# Patient Record
Sex: Female | Born: 1935 | Race: Black or African American | Hispanic: No | Smoking: Never smoker
Health system: Southern US, Community
[De-identification: ages and names within clinical notes are randomized; demographics above are authoritative.]

## PROBLEM LIST (undated history)

## (undated) DIAGNOSIS — E119 Type 2 diabetes mellitus without complications: Secondary | ICD-10-CM

## (undated) DIAGNOSIS — I739 Peripheral vascular disease, unspecified: Secondary | ICD-10-CM

## (undated) DIAGNOSIS — I5020 Unspecified systolic (congestive) heart failure: Secondary | ICD-10-CM

## (undated) DIAGNOSIS — I509 Heart failure, unspecified: Secondary | ICD-10-CM

## (undated) DIAGNOSIS — I10 Essential (primary) hypertension: Secondary | ICD-10-CM

## (undated) DIAGNOSIS — N189 Chronic kidney disease, unspecified: Secondary | ICD-10-CM

## (undated) DIAGNOSIS — J449 Chronic obstructive pulmonary disease, unspecified: Secondary | ICD-10-CM

## (undated) HISTORY — DX: Chronic obstructive pulmonary disease, unspecified: J44.9

## (undated) HISTORY — DX: Peripheral vascular disease, unspecified: I73.9

---

## 1999-12-09 ENCOUNTER — Encounter: Payer: Self-pay | Admitting: Emergency Medicine

## 1999-12-10 ENCOUNTER — Inpatient Hospital Stay (HOSPITAL_COMMUNITY): Admission: EM | Admit: 1999-12-10 | Discharge: 1999-12-13 | Payer: Self-pay | Admitting: Emergency Medicine

## 2000-02-18 ENCOUNTER — Encounter: Admission: RE | Admit: 2000-02-18 | Discharge: 2000-02-18 | Payer: Self-pay | Admitting: Family Medicine

## 2000-02-18 ENCOUNTER — Encounter: Payer: Self-pay | Admitting: Family Medicine

## 2000-02-25 ENCOUNTER — Encounter: Admission: RE | Admit: 2000-02-25 | Discharge: 2000-02-25 | Payer: Self-pay | Admitting: Family Medicine

## 2000-02-25 ENCOUNTER — Encounter: Payer: Self-pay | Admitting: Family Medicine

## 2001-07-20 ENCOUNTER — Encounter: Payer: Self-pay | Admitting: Family Medicine

## 2001-07-20 ENCOUNTER — Encounter: Admission: RE | Admit: 2001-07-20 | Discharge: 2001-07-20 | Payer: Self-pay | Admitting: Family Medicine

## 2005-12-10 ENCOUNTER — Encounter: Admission: RE | Admit: 2005-12-10 | Discharge: 2005-12-10 | Payer: Self-pay | Admitting: Family Medicine

## 2006-08-18 ENCOUNTER — Inpatient Hospital Stay (HOSPITAL_COMMUNITY): Admission: EM | Admit: 2006-08-18 | Discharge: 2006-08-22 | Payer: Self-pay | Admitting: *Deleted

## 2006-08-18 ENCOUNTER — Encounter (INDEPENDENT_AMBULATORY_CARE_PROVIDER_SITE_OTHER): Payer: Self-pay | Admitting: Cardiology

## 2007-05-11 ENCOUNTER — Encounter: Admission: RE | Admit: 2007-05-11 | Discharge: 2007-05-11 | Payer: Self-pay | Admitting: Podiatry

## 2008-11-09 ENCOUNTER — Ambulatory Visit: Payer: Self-pay | Admitting: Cardiology

## 2008-11-10 ENCOUNTER — Inpatient Hospital Stay (HOSPITAL_COMMUNITY): Admission: EM | Admit: 2008-11-10 | Discharge: 2008-11-18 | Payer: Self-pay | Admitting: Emergency Medicine

## 2008-11-13 ENCOUNTER — Encounter (INDEPENDENT_AMBULATORY_CARE_PROVIDER_SITE_OTHER): Payer: Self-pay | Admitting: Internal Medicine

## 2008-11-16 ENCOUNTER — Encounter (INDEPENDENT_AMBULATORY_CARE_PROVIDER_SITE_OTHER): Payer: Self-pay | Admitting: Internal Medicine

## 2010-06-25 ENCOUNTER — Ambulatory Visit: Payer: Self-pay | Admitting: Vascular Surgery

## 2010-06-28 ENCOUNTER — Ambulatory Visit (HOSPITAL_COMMUNITY): Admission: RE | Admit: 2010-06-28 | Discharge: 2010-06-28 | Payer: Self-pay | Admitting: Vascular Surgery

## 2010-06-28 ENCOUNTER — Ambulatory Visit: Payer: Self-pay | Admitting: Vascular Surgery

## 2011-01-09 LAB — POCT I-STAT, CHEM 8
BUN: 34 mg/dL — ABNORMAL HIGH (ref 6–23)
Calcium, Ion: 1.09 mmol/L — ABNORMAL LOW (ref 1.12–1.32)
Creatinine, Ser: 1.9 mg/dL — ABNORMAL HIGH (ref 0.4–1.2)
Glucose, Bld: 101 mg/dL — ABNORMAL HIGH (ref 70–99)
TCO2: 21 mmol/L (ref 0–100)

## 2011-01-09 LAB — BASIC METABOLIC PANEL
BUN: 33 mg/dL — ABNORMAL HIGH (ref 6–23)
CO2: 22 mEq/L (ref 19–32)
Calcium: 9 mg/dL (ref 8.4–10.5)
Chloride: 107 mEq/L (ref 96–112)
Creatinine, Ser: 1.73 mg/dL — ABNORMAL HIGH (ref 0.4–1.2)
Glucose, Bld: 91 mg/dL (ref 70–99)

## 2011-02-10 LAB — COMPREHENSIVE METABOLIC PANEL
ALT: <8 U/L (ref 0–35)
AST: 18 U/L (ref 0–37)
CO2: 26 mEq/L (ref 19–32)
Chloride: 101 mEq/L (ref 96–112)
Creatinine, Ser: 1.92 mg/dL — ABNORMAL HIGH (ref 0.4–1.2)
GFR calc Af Amer: 30 mL/min — ABNORMAL LOW (ref >60)
GFR calc non Af Amer: 25 mL/min — ABNORMAL LOW (ref >60)
Glucose, Bld: 67 mg/dL — ABNORMAL LOW (ref 70–99)
Total Bilirubin: 0.6 mg/dL (ref 0.3–1.2)

## 2011-02-10 LAB — POCT CARDIAC MARKERS
CKMB, poc: 2.5 ng/mL (ref 1.0–8.0)
Myoglobin, poc: 156 ng/mL (ref 12–200)
Troponin i, poc: <0.05 ng/mL (ref 0.00–0.09)

## 2011-02-10 LAB — BASIC METABOLIC PANEL
BUN: 22 mg/dL (ref 6–23)
BUN: 36 mg/dL — ABNORMAL HIGH (ref 6–23)
BUN: 89 mg/dL — ABNORMAL HIGH (ref 6–23)
CO2: 25 mEq/L (ref 19–32)
CO2: 26 mEq/L (ref 19–32)
CO2: 26 mEq/L (ref 19–32)
CO2: 27 mEq/L (ref 19–32)
Calcium: 8.5 mg/dL (ref 8.4–10.5)
Calcium: 8.7 mg/dL (ref 8.4–10.5)
Calcium: 8.8 mg/dL (ref 8.4–10.5)
Calcium: 8.8 mg/dL (ref 8.4–10.5)
Chloride: 100 mEq/L (ref 96–112)
Chloride: 101 mEq/L (ref 96–112)
Chloride: 103 mEq/L (ref 96–112)
Chloride: 104 mEq/L (ref 96–112)
Chloride: 106 mEq/L (ref 96–112)
Creatinine, Ser: 2.07 mg/dL — ABNORMAL HIGH (ref 0.4–1.2)
GFR calc Af Amer: 28 mL/min — ABNORMAL LOW (ref >60)
GFR calc Af Amer: 30 mL/min — ABNORMAL LOW (ref >60)
GFR calc non Af Amer: 15 mL/min — ABNORMAL LOW (ref >60)
GFR calc non Af Amer: 23 mL/min — ABNORMAL LOW (ref >60)
GFR calc non Af Amer: 23 mL/min — ABNORMAL LOW (ref >60)
GFR calc non Af Amer: 25 mL/min — ABNORMAL LOW (ref >60)
GFR calc non Af Amer: 27 mL/min — ABNORMAL LOW (ref >60)
GFR calc non Af Amer: 39 mL/min — ABNORMAL LOW (ref >60)
Glucose, Bld: 100 mg/dL — ABNORMAL HIGH (ref 70–99)
Glucose, Bld: 101 mg/dL — ABNORMAL HIGH (ref 70–99)
Glucose, Bld: 103 mg/dL — ABNORMAL HIGH (ref 70–99)
Glucose, Bld: 112 mg/dL — ABNORMAL HIGH (ref 70–99)
Glucose, Bld: 188 mg/dL — ABNORMAL HIGH (ref 70–99)
Glucose, Bld: 81 mg/dL (ref 70–99)
Glucose, Bld: 91 mg/dL (ref 70–99)
Potassium: 3.9 mEq/L (ref 3.5–5.1)
Potassium: 4 mEq/L (ref 3.5–5.1)
Potassium: 4.1 mEq/L (ref 3.5–5.1)
Potassium: 4.3 mEq/L (ref 3.5–5.1)
Potassium: 4.3 mEq/L (ref 3.5–5.1)
Potassium: 4.4 mEq/L (ref 3.5–5.1)
Sodium: 136 mEq/L (ref 135–145)
Sodium: 137 mEq/L (ref 135–145)
Sodium: 137 mEq/L (ref 135–145)
Sodium: 138 mEq/L (ref 135–145)
Sodium: 138 mEq/L (ref 135–145)
Sodium: 139 mEq/L (ref 135–145)

## 2011-02-10 LAB — RETICULOCYTES
RBC.: 3.46 MIL/uL — ABNORMAL LOW (ref 3.87–5.11)
Retic Count, Absolute: 38.1 10*3/uL (ref 19.0–186.0)

## 2011-02-10 LAB — URINE MICROSCOPIC-ADD ON

## 2011-02-10 LAB — CARDIAC PANEL(CRET KIN+CKTOT+MB+TROPI)
CK, MB: 3.1 ng/mL (ref 0.3–4.0)
CK, MB: 4.1 ng/mL — ABNORMAL HIGH (ref 0.3–4.0)
Relative Index: INVALID (ref 0.0–2.5)
Total CK: 58 U/L (ref 7–177)
Troponin I: 0.42 ng/mL — ABNORMAL HIGH (ref 0.00–0.06)

## 2011-02-10 LAB — DIFFERENTIAL
Basophils Absolute: 0 10*3/uL (ref 0.0–0.1)
Basophils Absolute: 0 10*3/uL (ref 0.0–0.1)
Basophils Absolute: 0.1 10*3/uL (ref 0.0–0.1)
Basophils Relative: 0 % (ref 0–1)
Eosinophils Absolute: 0.1 K/uL (ref 0.0–0.7)
Eosinophils Relative: 0 % (ref 0–5)
Eosinophils Relative: 2 % (ref 0–5)
Lymphocytes Relative: 16 % (ref 12–46)
Lymphocytes Relative: 17 % (ref 12–46)
Lymphocytes Relative: 9 % — ABNORMAL LOW (ref 12–46)
Lymphs Abs: 1.5 10*3/uL (ref 0.7–4.0)
Lymphs Abs: 1.7 10*3/uL (ref 0.7–4.0)
Lymphs Abs: 1.9 K/uL (ref 0.7–4.0)
Monocytes Absolute: 0.7 10*3/uL (ref 0.1–1.0)
Monocytes Absolute: 0.9 10*3/uL (ref 0.1–1.0)
Monocytes Absolute: 1 K/uL (ref 0.1–1.0)
Monocytes Relative: 5 % (ref 3–12)
Monocytes Relative: 9 % (ref 3–12)
Neutro Abs: 17.3 10*3/uL — ABNORMAL HIGH (ref 1.7–7.7)
Neutro Abs: 6.4 10*3/uL (ref 1.7–7.7)
Neutro Abs: 7.6 10*3/uL (ref 1.7–7.7)
Neutrophils Relative %: 85 % — ABNORMAL HIGH (ref 43–77)

## 2011-02-10 LAB — CBC
HCT: 27.1 % — ABNORMAL LOW (ref 36.0–46.0)
HCT: 27.4 % — ABNORMAL LOW (ref 36.0–46.0)
HCT: 30.1 % — ABNORMAL LOW (ref 36.0–46.0)
HCT: 32 % — ABNORMAL LOW (ref 36.0–46.0)
Hemoglobin: 10 g/dL — ABNORMAL LOW (ref 12.0–15.0)
Hemoglobin: 10.3 g/dL — ABNORMAL LOW (ref 12.0–15.0)
Hemoglobin: 9 g/dL — ABNORMAL LOW (ref 12.0–15.0)
Hemoglobin: 9.1 g/dL — ABNORMAL LOW (ref 12.0–15.0)
Hemoglobin: 9.2 g/dL — ABNORMAL LOW (ref 12.0–15.0)
Hemoglobin: 9.7 g/dL — ABNORMAL LOW (ref 12.0–15.0)
Hemoglobin: 9.8 g/dL — ABNORMAL LOW (ref 12.0–15.0)
MCHC: 32 g/dL (ref 30.0–36.0)
MCHC: 32.1 g/dL (ref 30.0–36.0)
MCHC: 32.2 g/dL (ref 30.0–36.0)
MCHC: 32.4 g/dL (ref 30.0–36.0)
MCHC: 33 g/dL (ref 30.0–36.0)
MCHC: 33.4 g/dL (ref 30.0–36.0)
MCV: 88.7 fL (ref 78.0–100.0)
MCV: 90 fL (ref 78.0–100.0)
MCV: 90.4 fL (ref 78.0–100.0)
MCV: 90.7 fL (ref 78.0–100.0)
Platelets: 155 10*3/uL (ref 150–400)
Platelets: 197 10*3/uL (ref 150–400)
Platelets: 198 10*3/uL (ref 150–400)
Platelets: 203 10*3/uL (ref 150–400)
Platelets: 245 K/uL (ref 150–400)
RBC: 3.04 MIL/uL — ABNORMAL LOW (ref 3.87–5.11)
RBC: 3.09 MIL/uL — ABNORMAL LOW (ref 3.87–5.11)
RBC: 3.53 MIL/uL — ABNORMAL LOW (ref 3.87–5.11)
RDW: 14.6 % (ref 11.5–15.5)
RDW: 14.8 % (ref 11.5–15.5)
RDW: 14.9 % (ref 11.5–15.5)
RDW: 15 % (ref 11.5–15.5)
RDW: 15.1 % (ref 11.5–15.5)
RDW: 15.1 % (ref 11.5–15.5)
RDW: 15.3 % (ref 11.5–15.5)
WBC: 10.5 10*3/uL (ref 4.0–10.5)
WBC: 12.9 10*3/uL — ABNORMAL HIGH (ref 4.0–10.5)
WBC: 20.4 10*3/uL — ABNORMAL HIGH (ref 4.0–10.5)
WBC: 7.8 10*3/uL (ref 4.0–10.5)
WBC: 8.9 10*3/uL (ref 4.0–10.5)

## 2011-02-10 LAB — GLUCOSE, CAPILLARY
Glucose-Capillary: 100 mg/dL — ABNORMAL HIGH (ref 70–99)
Glucose-Capillary: 100 mg/dL — ABNORMAL HIGH (ref 70–99)
Glucose-Capillary: 100 mg/dL — ABNORMAL HIGH (ref 70–99)
Glucose-Capillary: 100 mg/dL — ABNORMAL HIGH (ref 70–99)
Glucose-Capillary: 102 mg/dL — ABNORMAL HIGH (ref 70–99)
Glucose-Capillary: 103 mg/dL — ABNORMAL HIGH (ref 70–99)
Glucose-Capillary: 107 mg/dL — ABNORMAL HIGH (ref 70–99)
Glucose-Capillary: 109 mg/dL — ABNORMAL HIGH (ref 70–99)
Glucose-Capillary: 116 mg/dL — ABNORMAL HIGH (ref 70–99)
Glucose-Capillary: 117 mg/dL — ABNORMAL HIGH (ref 70–99)
Glucose-Capillary: 121 mg/dL — ABNORMAL HIGH (ref 70–99)
Glucose-Capillary: 123 mg/dL — ABNORMAL HIGH (ref 70–99)
Glucose-Capillary: 124 mg/dL — ABNORMAL HIGH (ref 70–99)
Glucose-Capillary: 127 mg/dL — ABNORMAL HIGH (ref 70–99)
Glucose-Capillary: 129 mg/dL — ABNORMAL HIGH (ref 70–99)
Glucose-Capillary: 131 mg/dL — ABNORMAL HIGH (ref 70–99)
Glucose-Capillary: 139 mg/dL — ABNORMAL HIGH (ref 70–99)
Glucose-Capillary: 140 mg/dL — ABNORMAL HIGH (ref 70–99)
Glucose-Capillary: 142 mg/dL — ABNORMAL HIGH (ref 70–99)
Glucose-Capillary: 145 mg/dL — ABNORMAL HIGH (ref 70–99)
Glucose-Capillary: 150 mg/dL — ABNORMAL HIGH (ref 70–99)
Glucose-Capillary: 151 mg/dL — ABNORMAL HIGH (ref 70–99)
Glucose-Capillary: 154 mg/dL — ABNORMAL HIGH (ref 70–99)
Glucose-Capillary: 154 mg/dL — ABNORMAL HIGH (ref 70–99)
Glucose-Capillary: 155 mg/dL — ABNORMAL HIGH (ref 70–99)
Glucose-Capillary: 155 mg/dL — ABNORMAL HIGH (ref 70–99)
Glucose-Capillary: 159 mg/dL — ABNORMAL HIGH (ref 70–99)
Glucose-Capillary: 178 mg/dL — ABNORMAL HIGH (ref 70–99)
Glucose-Capillary: 183 mg/dL — ABNORMAL HIGH (ref 70–99)
Glucose-Capillary: 65 mg/dL — ABNORMAL LOW (ref 70–99)
Glucose-Capillary: 65 mg/dL — ABNORMAL LOW (ref 70–99)
Glucose-Capillary: 66 mg/dL — ABNORMAL LOW (ref 70–99)
Glucose-Capillary: 76 mg/dL (ref 70–99)
Glucose-Capillary: 81 mg/dL (ref 70–99)
Glucose-Capillary: 86 mg/dL (ref 70–99)
Glucose-Capillary: 88 mg/dL (ref 70–99)
Glucose-Capillary: 94 mg/dL (ref 70–99)
Glucose-Capillary: 94 mg/dL (ref 70–99)
Glucose-Capillary: 99 mg/dL (ref 70–99)

## 2011-02-10 LAB — FOLATE: Folate: >20 ng/mL

## 2011-02-10 LAB — POCT I-STAT 3, ART BLOOD GAS (G3+)
Bicarbonate: 25.7 meq/L — ABNORMAL HIGH (ref 20.0–24.0)
O2 Saturation: 98 %
Patient temperature: 97.5
TCO2: 27 mmol/L (ref 0–100)
pCO2 arterial: 42.6 mmHg (ref 35.0–45.0)
pH, Arterial: 7.387 (ref 7.350–7.400)
pO2, Arterial: 109 mmHg — ABNORMAL HIGH (ref 80.0–100.0)

## 2011-02-10 LAB — BASIC METABOLIC PANEL WITH GFR
CO2: 23 meq/L (ref 19–32)
Calcium: 8.5 mg/dL (ref 8.4–10.5)
Creatinine, Ser: 1.28 mg/dL — ABNORMAL HIGH (ref 0.4–1.2)
GFR calc Af Amer: 48 mL/min — ABNORMAL LOW (ref >60)
Glucose, Bld: 277 mg/dL — ABNORMAL HIGH (ref 70–99)

## 2011-02-10 LAB — BRAIN NATRIURETIC PEPTIDE
Pro B Natriuretic peptide (BNP): 2765 pg/mL — ABNORMAL HIGH (ref 0.0–100.0)
Pro B Natriuretic peptide (BNP): 344 pg/mL — ABNORMAL HIGH (ref 0.0–100.0)
Pro B Natriuretic peptide (BNP): 454 pg/mL — ABNORMAL HIGH (ref 0.0–100.0)
Pro B Natriuretic peptide (BNP): >3200 pg/mL — ABNORMAL HIGH (ref 0.0–100.0)

## 2011-02-10 LAB — CK TOTAL AND CKMB (NOT AT ARMC)
CK, MB: 2.3 ng/mL (ref 0.3–4.0)
Relative Index: INVALID (ref 0.0–2.5)
Total CK: 47 U/L (ref 7–177)

## 2011-02-10 LAB — URINALYSIS, MICROSCOPIC ONLY
Hgb urine dipstick: NEGATIVE
Nitrite: NEGATIVE
Specific Gravity, Urine: 1.016 (ref 1.005–1.030)
Urobilinogen, UA: 0.2 mg/dL (ref 0.0–1.0)

## 2011-02-10 LAB — CULTURE, BLOOD (ROUTINE X 2)
Culture: NO GROWTH
Culture: NO GROWTH

## 2011-02-10 LAB — URINE CULTURE: Colony Count: >=100000

## 2011-02-10 LAB — URINALYSIS, ROUTINE W REFLEX MICROSCOPIC
Bilirubin Urine: NEGATIVE
Glucose, UA: NEGATIVE mg/dL
Ketones, ur: NEGATIVE mg/dL
Nitrite: POSITIVE — AB
Protein, ur: 30 mg/dL — AB
Specific Gravity, Urine: 1.01 (ref 1.005–1.030)
Urobilinogen, UA: 0.2 mg/dL (ref 0.0–1.0)
pH: 5.5 (ref 5.0–8.0)

## 2011-02-10 LAB — CROSSMATCH

## 2011-02-10 LAB — RENAL FUNCTION PANEL
Albumin: 2.6 g/dL — ABNORMAL LOW (ref 3.5–5.2)
Chloride: 107 mEq/L (ref 96–112)
GFR calc Af Amer: 30 mL/min — ABNORMAL LOW (ref >60)
GFR calc non Af Amer: 25 mL/min — ABNORMAL LOW (ref >60)
Potassium: 4.3 mEq/L (ref 3.5–5.1)

## 2011-02-10 LAB — FERRITIN: Ferritin: 168 ng/mL (ref 10–291)

## 2011-02-10 LAB — IRON AND TIBC
Iron: 17 ug/dL — ABNORMAL LOW (ref 42–135)
UIBC: 211 ug/dL

## 2011-02-10 LAB — LIPID PANEL
LDL Cholesterol: 90 mg/dL (ref 0–99)
Total CHOL/HDL Ratio: 3.3 RATIO
VLDL: 6 mg/dL (ref 0–40)

## 2011-02-10 LAB — ABO/RH: ABO/RH(D): O POS

## 2011-02-10 LAB — HEMOGLOBIN A1C: Hgb A1c MFr Bld: 6.7 % — ABNORMAL HIGH (ref 4.6–6.1)

## 2011-02-10 LAB — TROPONIN I: Troponin I: 0.16 ng/mL — ABNORMAL HIGH (ref 0.00–0.06)

## 2011-02-10 LAB — PROTIME-INR: INR: 1.2 (ref 0.00–1.49)

## 2011-03-11 NOTE — H&P (Signed)
Alyssa Knight, Alyssa Knight                ACCOUNT NO.:  [id number]   MEDICAL RECORD NO.:  [id number]          PATIENT TYPE:  INP   LOCATION:  2603                         FACILITY:  [facility]   PHYSICIAN:  Michiel Cowboy, MDDATE OF BIRTH:  1917-08-29   DATE OF ADMISSION:  11/09/2008  DATE OF DISCHARGE:                              HISTORY & PHYSICAL   PRIORITY ADMISSION HISTORY AND PHYSICAL   PRIMARY CARE Fanta Wimber[place]:  Dr. Bryan Lemma. Ehinger.   CHIEF COMPLAINT:  Shortness of breath.   HISTORY OF PRESENT ILLNESS:  The patient is an 77 year old female with  past medical history of congestive heart failure and coronary artery  disease, who tells me that for the past week or so, she had not really  taken her Lasix that she is supposed to take once a day.  She denies any  chest pain, but all of a sudden today developed shortness of breath when  cooking.  Otherwise, no fevers, no chills.  Review of systems is  otherwise unremarkable.   She is followed by Dr. Katrinka Blazing for her coronary artery disease.  Initially, her blood pressure was elevated in the 200s and she has a  slightly elevated troponin of 0.15.  Currently, her blood pressure is  coming down.  She is on a BiPAP and a nitro drip and feeling much  better, and her shortness of breath almost resolved.   PAST MEDICAL HISTORY:  Significant for:  1. Congestive heart failure with an EF of 40 to 50%.  2. Kidney failure in the past secondary to contrast nephropathy.  3. Diabetes mellitus.  4. Coronary artery disease.  5. Hypertension.  6. Hyperlipidemia.   SOCIAL HISTORY:  The patient does not smoke, drinks occasional alcohol  maybe once a month or so, lives at home apparently by herself.   FAMILY HISTORY:  Noncontributory.   ALLERGIES:  No known drug allergies.   MEDICATIONS:  1. Aspirin 81 mg p.o. daily.  2. Glipizide 10 mg p.o. daily.  3. Lopressor 75 mg twice a day.  4. Simvastatin 40 mg once a day.  5. Multivitamins.  6. Lasix 40  mg q.a.m.   PHYSICAL EXAMINATION:  GENERAL:  The patient appears to be currently in  no acute distress, conversant.  HEAD:  Nontraumatic, patient alert and oriented, moist mucous membranes.  LUNGS:  Crackles bilaterally.  HEART:  Heart examined secondary to loud BiPAP noises, but I did not  appreciate a murmur, appears to be regular.  ABDOMEN:  Soft, nontender, and nondistended.  LOWER EXTREMITIES:  No clubbing, cyanosis, or edema.   LABORATORY DATA:  White blood cell count 20.4, hemoglobin 10.3, sodium  136, potassium 4.1, creatinine 1.3.  Cardiac enzymes and iSTATs are  negative, but the first set of sent out cardiac markers significant for  troponin of 0.16 with CK-MB of 2.3 and total CK of 47.  BNP 2700.  Chest  x-ray showing cardiomegaly and questionable edema versus pneumonia.  EF  has been 45 to 50% on an echocardiogram in 2007.   EKG showing normal sinus rhythm, slight peaked Q-waves in lead 2,  3, and  aVF, no ST depression or elevation noted.  Otherwise, the EKG appears to  be nonischemic with a couple of PVCs.   ASSESSMENT AND PLAN:  This is an 77 year old female with flash pulmonary  edema, severe hypertension, urinary tract infection, and slightly  elevated troponin.  1. Congestive heart failure (CHF).  Given the patient is still on      (BiPAP) bi-level positive airway pressure, we will admit to      stepdown.  We will check arterial blood gas (ABG).  Likely patient      will be able to come off of bi-level positive airway pressure soon      since she is already feeling better.  We will order a two-D      echocardiogram in the a.m. to see if there is any change.  2. Will continue for now a nitroglycerin drip for hypertension and      also to help with congestive heart failure.  Will give Lasix      intravenously for orthostatics, daily weight, strict intake and      output (I's and O's), continue to cycle cardiac enzymes.  3. Elevated troponin likely related to  congestive heart failure but      will call a Cardiology consultation and continue to cycle.  Now      that the patient is improving, we will try to restart her      Lopressor, continue a statin.  The patient supposedly has a long      history of coronary artery disease, although I do not see a cardiac      catheterization report.  4. Itchy eye.  We will cover with Levaquin.  Given that we cannot rule      out pneumonia, and given chest x-ray, we will cover Levaquin as      this can help [place] to cover both.  We will repeat a chest x-ray to      see if there is any clearance and clarification of whether this is      pneumonia or not.  Await urine cultures.  5. Prophylaxis.  Protonix plus Lovenox.  6. Diabetes.  Hold Glipizide and do sliding scale for now.      Michiel Cowboy, MD  Electronically Signed     AVD/MEDQ  D:  11/09/2008  T:  11/10/2008  Job:  [id number]   cc:   Lyn Records, M.D.  Bryan Lemma. Manus Gunning, M.D.

## 2011-03-11 NOTE — Discharge Summary (Signed)
Alyssa Knight, RYBACK                ACCOUNT NO.:  [id number]   MEDICAL RECORD NO.:  [id number]          PATIENT TYPE:  INP   LOCATION:  3739                         FACILITY:  [facility]   PHYSICIAN:  Kela Millin, M.D.DATE OF BIRTH:  [birth year]   DATE OF ADMISSION:  11/09/2008  DATE OF DISCHARGE:  11/18/2008                               DISCHARGE SUMMARY   DISCHARGE DIAGNOSES:  1. Congestive heart failure exacerbation.  2. Acute renal failure - secondary to ACE inhibitor and diuretic, also      abnormal renal ultrasound.  3. E. coli urinary tract infection - status post completion of      antibiotics in the hospital.  4. Diabetes mellitus.  5. History of coronary artery disease.  6. Hypertension.  7. Hyperlipidemia.  8. History of kidney failure in the past secondary to contrast      nephropathy.  9. Probable pneumonia - status post completion of adequate antibiotics      in the hospital.  10.Iron deficiency anemia - status post IV transfusion and also      transfusion of packed red blood cells.  Follow up with GI as      outpatient for further evaluation.  11.Cholelithiasis - per ultrasound - asymptomatic.   PROCEDURES AND STUDIES:  1. 2-D echocardiogram - overall left ventricular systolic function      moderately decreased - ejection fraction of 40-45%.  There was      akinesis of the basal mid inferior posterior wall.  Left      ventricular wall thickness mildly increased.  Doppler parameters      consistent with elevated left arterial filling pressure.  2. Renal ultrasound - bilateral abnormal kidneys, on the left there is      hydronephrosis and urologic calculi versus vascular calcification;      on the right appearance could reflect hydronephrosis/pyelonephrosis      with debris in the right renal pelvis.  This is a perinephric      abscess versus multiple parapelvic hemorrhagic cysts.  The bladder      not identified, presumably decompressed.  Cholelithiasis  noted.   CONSULTATION:  1. Cardiology - Lyn Records, M.D.  2. Renal - Garnetta Buddy, M.D.   BRIEF HISTORY:  The patient is an 77 year old white female with the  above listed medical problems who presented with complaints of shortness  of breath for about 1 week.  She reported that she has not been taking  her Lasix for some time.  She denied chest pain, fevers and no chills.  In the ER the patient's blood pressure was noted to be elevated in the  200s and her troponin was slightly elevated at 0.15.  She was started on  BiPAP and nitroglycerin drip in the ER and her symptoms improved, she  was admitted for further evaluation and management.   Please see the full admission history and physical dictated on November 09, 2008, by Dr. Adela Glimpse for the details of the admission physical exam  as well as the laboratory data.   HOSPITAL COURSE:  1. Congestive  heart failure exacerbation - as discussed above in the      ER she was placed on BiPAP with improvement of her symptoms.  She      was also placed on nitro drip.  She had serial cardiac enzymes done      and they were slightly elevated and cardiology was consulted and      followed the patient.  A 2-D echocardiogram was done and the      results as stated above.  Her symptoms completely resolved.  She      was placed on an ACE inhibitor and while she was being diuresed her      renal function began to worsen and so her diuretics were held and      also the ACE inhibitor was discontinued.  Even with these measures      her renal function continued to worsen from 1.28 on admission      creatinine to a peak creatinine of 2.89 while in the hospital with      a BUN of 94.  Cardiology gently hydrated the patient and also      nephrology was consulted and Dr. Hyman Hopes saw the patient and his      impression was that the worsening renal function was secondary to      the diuretics as well as the ACE inhibitor and also ordered a renal       ultrasound to further evaluate.  Dr. Katrinka Blazing placed the patient on      BiDil and recommended that she be restarted on Lasix prior to      discharge as her renal function was improving.  The CHF is      compensated at this time and the patient remains asymptomatic.  She      will be discharged on BiDil and Lasix as well as Toprol and is to      follow up with her primary care physician and Dr. Garnette Scheuermann as      directed.  2. Acute renal failure - as discussed above, with the worsening of her      renal function nephrology was consulted and Dr. Hyman Hopes saw the      patient and a renal ultrasound was done which revealed bilateral      abnormal kidneys, hydronephrosis noted on the left and the question      of urologic calculi versus vascular calcifications, also on the      right questionable hydronephrosis with debris in the right renal      pelvis versus abscess versus multiple cysts.  With discontinuing      the ACE inhibitor and holding off diuretics her renal function      improved from the peak of 2.89 to 1.8 today prior to discharge.  I      discussed her ultrasound findings with a urologist, Dr. Retta Diones,      and he has recommended for the patient to follow up with him in the      clinic - she is to call for an appointment Monday, January 25 and      further testing to be ordered by Dr. Retta Diones once he sees her in      the clinic.  Ms. Alyssa Knight is voiding appropriately with no pain and she      will be discharged at this time for outpatient followup.  3. E. coli urinary tract infection - Ms. Alyssa Knight had a urinalysis done  upon admission which was consistent with a UTI and she was started      on empiric antibiotics.  The urine cultures grew E. coli which was      pansensitive.  The patient completed an adequate course of IV      antibiotics during this hospital stay and no further antibiotics      upon discharge.  4. Probable pneumonia - the patient was empirically treated with       antibiotics and has completed the full antibiotic course and will      not require any further antibiotics upon discharge.  5. Hypertension - the patient's beta-blocker was increased to 100 mg.      She had been taking Lopressor b.i.d. and was changed to Toprol once      daily 100 mg.  She was to continue this upon discharge.  6. Diabetes mellitus - Accu-Cheks were monitored and she was covered      with insulin in the hospital.  She was to continue her outpatient      medications upon discharge.  7. History of coronary artery disease.  She remained chest pain-free      during her hospital stay.   DISCHARGE MEDICATIONS:  1. BiDil one p.o. b.i.d.  2. Toprol XL 100 mg p.o. daily.  3. Lasix one p.o. daily.  4. Prilosec 40 mg p.o. daily.  5. Mucinex one p.o. daily.  6. Zocor 40 mg p.o. daily.  7. Glipizide 10 mg p.o. daily.  8. Aspirin 81 mg p.o. daily.  9. Multivitamin one p.o. daily.  10.KCl 10 mEq p.o. daily.   FOLLOWUP CARE:  1. Dr. Marcine Matar, the patient is to call Monday, January 25      for followup appointment.  2. Dr. Blair Heys in 1 week, she is to call for followup      appointment and have a BMET done on followup for      recheck of her renal function.  3. Dr. Verdis Prime, cardiology, in 2-3 weeks as directed.  She is to      call for an appearance.   DISCHARGE CONDITION:  Improved/stable.      Kela Millin, M.D.  Electronically Signed     ACV/MEDQ  D:  11/18/2008  T:  11/18/2008  Job:  [id number]   cc:   Lyn Records, M.D.  Bertram Millard. Dahlstedt, M.D.  Bryan Lemma. Manus Gunning, M.D.

## 2011-03-11 NOTE — Consult Note (Signed)
Alyssa Knight, PAT NO.:  [id number]   MEDICAL RECORD NO.:  [id number]          PATIENT TYPE:  EMS   LOCATION:  MAJO                         FACILITY:  [facility]   PHYSICIAN:  Rollene Rotunda, MD, FACCDATE OF BIRTH:  November 13, 1916   DATE OF CONSULTATION:  11/10/2008  DATE OF DISCHARGE:                                 CONSULTATION   The primary is Dr. Blair Heys.   Cardiologist is Dr. Garnette Scheuermann.   REASON FOR CONSULTATION:  The patient with congestive heart failure and  elevated troponin.   HISTORY OF PRESENT ILLNESS:  The patient is a very lovely 77 year old  African female with a history of coronary disease and mixed systolic,  diastolic heart failure.  She has actually been pretty well compensated  and has not been in the hospital for treatment of this since 2007.  She  sees Dr. Katrinka Blazing routinely.  She has been doing well.  She had an upper  respiratory type infection around Christmas time and had been staying  with her son and daughter-in-law.  They have been preparing her food.  However, for the past week she had been at home cooking for herself.  She freely admits to not restricting salt.  She had a salty meal today.  About a half hour later she developed shortness of breath.  This came on  suddenly.  She was at rest.  9-1-1 was called.  She was quite tachypneic  when they got there.  When she got to [facility] her respiratory rate  was in the 40s.  Blood pressure was 200/108.  Her pulse was sinus 101.  She had some chronic stable bilateral infiltrates on chest x-ray.  However, her BNP was 2765.  Troponin 0.16.  There were no acute ischemic  ST-T wave changes.  She improved quickly with BiPAP, IV Lasix and IV  nitroglycerin.  Throughout all of this she has not had any chest  pressure, neck or arm discomfort.  She did not notice any palpitations,  presyncope or syncope.  She denied any PND or orthopnea.  She gets  around  in her house and does her own chores  without limitations.  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   She has had no weight change or swelling.  She had no fevers, chills or  cough recently.  She had been feeling well up to this point.   PAST MEDICAL HISTORY:  Coronary artery disease (catheterization in 2001  demonstrated LAD 80%, distal stenosis, left circumflex proximal 80%  stenosis and segmental 80-95% stenosis in the obtuse marginals, right  coronary artery 80% mid, PDA stenosis and 95% stenosis in the small LV  branch), cardiomyopathy (mixed systolic-diastolic, her last ejection  fraction in 2007 was 45-50%).  Hypertension.  Diabetes x10 years.  Hyperlipidemia.   PAST SURGICAL HISTORY:  Right breast biopsy.   ALLERGIES/INTOLERANCES:  None.   MEDICATIONS:  Aspirin 81 mg daily, glipizide 10 mg daily, Lopressor 75  mg b.i.d., simvastatin 40 mg daily, multivitamin, Lasix 40 mg q.a.m.   SOCIAL HISTORY:  She has one son.  She has grandchildren.  She lives  alone.  She has never smoked cigarettes and does not drink alcohol.   FAMILY HISTORY:  Noncontributory.  Her mother died at 48.  Father died  of unknown causes.   REVIEW OF SYSTEMS:  As stated in the HPI and otherwise negative for all  other systems.   PHYSICAL EXAMINATION:  GENERAL APPEARANCE:  The patient looks much  younger than her stated age.  She is in no distress.  VITAL SIGNS:  Blood pressure 161/67, heart rate 86 and regular,  respiratory rate 30, 99% saturation on BiPAP, temperature 100.2 rectal.  HEENT:  Eyes are unremarkable. Pupils are equal, round, and reactive to  light.  Fundi are not visualized. Oral mucosa is not visualized  secondary to BiPAP.  NECK:  No jugular distention at 45 degrees.  Carotid upstroke brisk and  symmetrical.  No bruits. No thyromegaly.  LYMPHATICS:  No cervical, axillary, or inguinal adenopathy.  LUNGS:  Clear to auscultation bilaterally.  BACK:  No costovertebral angle tenderness.  CHEST:   Unremarkable.  HEART:  PMI not displaced or sustained, S1-S2 within normal limits.  No  S3, no S4, no clicks, no rubs, no murmurs.  ABDOMEN:  Flat, positive bowel sounds, normal in frequency and pitch, no  bruits, rebound, no guarding, midline pulsatile mass. No hepatomegaly.  No splenomegaly.  SKIN:  No rashes, no nodules.  EXTREMITIES:  With 2+ upper pulses.  There are 1+ dorsalis pedis and  posterior tibialis, mild bilateral lower extremity edema.  NEURO:  Oriented to person, place, and time. Cranial nerves II-XII  grossly intact. Motor grossly intact.   EKG:  Sinus tachycardia, rate 100, axis within normal limits, first-  degree AV block, premature atrial contractions, premature ventricular  contractions, no acute ST/T-wave changes.   LABS:  Troponin 0.16, CK-MB 47 and 2.3. BNP 2765. Sodium 36, potassium  4.1, BUN 22, creatinine 0.28. WBC 20.4, hemoglobin 10.3, platelets 245.   ASSESSMENT/PLAN:  1. Pulmonary edema.  The patient has acute dyspnea related to      pulmonary edema.  I think most of this is secondary to dietary      indiscretion.  She had been educated about this.  For now, I agree      with management which includes IV Lasix and IV nitroglycerin.  It      is okay to continue her beta blocker in this situation.  She will      have a repeat echocardiogram.  2. Elevated troponin.  The patient does have obstructive coronary      disease as described.  However, she has not any symptoms.  I      suspect the troponin leak is related to this disease as well as her      hypertensive urgency.  I would be conservative with her management      if she has no further symptoms, and her enzyme elevation is      minimal.  I will defer to Dr. Katrinka Blazing.  3. Coronary disease as above.  4. Leukocytosis. She is being managed by the hospitalist and is on      empiric antibiotics.  5. Diabetes. Per the primary team.  6. Hypertension.  This has otherwise been well managed by the       patient's report.  She will continue on meds as listed once the      acute events are over.      Rollene Rotunda, MD, [facility]  Electronically  Signed    JH/MEDQ  D:  11/10/2008  T:  11/10/2008  Job:  [id number]

## 2011-03-11 NOTE — Consult Note (Signed)
NAMEHEAVENLEE, MAIORANA NO.:  [id number]   MEDICAL RECORD NO.:  [id number]          PATIENT TYPE:  INP   LOCATION:  3739                         FACILITY:  [facility]   PHYSICIAN:  Garnetta Buddy, M.D.   DATE OF BIRTH:  12-May-1917   DATE OF CONSULTATION:  DATE OF DISCHARGE:                                 CONSULTATION   REASON FOR CONSULTATION:  Increasing serum creatinine.   HISTORY OF PRESENTING ILLNESS:  This is an 77 year old lady who has a  known congestive heart failure with an ejection fraction of 30%-40%.  She has a known history of coronary artery disease.  She was admitted  with a history of orthopnea, shortness of breath on exertion, lower  extremity swelling.  On admission, she was diuresed, started on ACE  inhibitor lisinopril.  This was discontinued on November 13, 2008.  Her  serum creatinine increased to 2.9.  She is in negative balance with a  weight loss of about 5 kg which translates to about 12 pounds.   PAST MEDICAL HISTORY:  1. Coronary artery disease.  2. Congestive heart failure.  3. History of breast biopsy benign.  4. Diabetes mellitus type 2 x10 years.  5. History of hypertension.  6. Hyperlipidemia.  7. History of chronic kidney disease.   MEDICATIONS:  1. Aspirin 81 mg daily.  2. Sliding-scale insulin.  3. Levaquin 750 mg every 48 hours.  4. BiDil 1 b.i.d.  5. Toprol 100 mg daily.  6. Zocor 40 mg daily.  7. Protonix 40 mg daily.   SOCIAL HISTORY:  She is widowed with 1 son, 2 grandchildren, 1 son died  of an MI.  Lives alone.  No alcohol, no tobacco.  She works in [employer].   FAMILY HISTORY:  No end-stage renal disease.   REVIEW OF SYSTEMS:  GENERAL:  Fatigue and weak but denies fever, sweats,  or chills.  EYES:  Denies visual complaints, blurred vision, double  vision, loss of vision.  EARS, NOSE, MOUTH, AND THROAT:  Denies any  hearing loss, epistaxis, no sore throat, no mouth lesions.  CARDIOVASCULAR:  As per HPI with  no syncope, baseline 2 pillow  orthopnea.  No paroxysmal nocturnal dyspnea.  No lower extremity  swelling.  RESPIRATORY:  Denies cough, wheeze, or hemoptysis.  ABDOMINAL  SYSTEM:  No abdominal pain, nausea, vomiting, no blood loss per rectum.  No history of EGD or colonoscopy.  UROGENITAL:  No urgency, frequency,  dysuria, or hematuria.  No frothy or foaminess noted in urine.  No  history of renal calculi.  NEUROLOGIC:  No strokes.  No seizures,  numbness, tingling, paresthesia, or weakness.  No dysarthria, dysphagia,  or dysphonia.  MUSCULOSKELETAL:  Denies using nonsteroidal inflammatory  drugs, no history of gout, no history of muscle weakness.  HEMATOLOGIC/ONCOLOGIC:  No history of cancer, DVT, or pulmonary embolus.  ENDOCRINE:  History of diabetes, but no history of retinopathy, no  history of peripheral neuropathy.   FAMILY HISTORY:  No significant complaints, per the patient double  vision or loss of vision.   PHYSICAL EXAMINATION:  GENERAL:  Alert, very pleasant lady in no obvious  distress.  VITAL SIGNS:  Blood pressure 130/60, pulse 75, temperature afebrile.  HEAD AND EYES:  Normocephalic and atraumatic.  No icterus, no pallor.  Pupils are round and equal and reactive to vision.  EARS, NOSE, MOUTH, AND THROAT:  Tympanic membranes normal.  Nasal mucosa  is clear.  Oropharynx is clear.  NECK:  Supple.  No thyromegaly or adenopathy.  JVP mildly elevated about  3-4 cm in a 45-degree angle.  RESPIRATORY:  Lung fields are clear to auscultation.  No wheezes or  rales.  HEART:  Regular rate and rhythm.  No murmurs, rubs, or gallops.  ABDOMEN:  Soft and nontender.  Bowel sounds present.  No  hepatosplenomegaly.  No abdominal bruits.  EXTREMITIES:  No cyanosis, clubbing, or edema.  Right and left  peripheral pulses were diminished.   LABORATORY DATA:  Most recent labs, sodium 137, potassium 4.3, chloride  100, CO2 26, BUN 94, creatinine 2.9, and glucose 112.  Calcium 8.6, WBC   8.4, hemoglobin 9.1, platelets 188, TSH 0.3, transferrin saturations of  7%, hemoglobin A1c 6.7, PT-INR of 16.8 and 35 respectively, PT, PTT  rather 15.8 and 35 respectively.  Urinalysis too numerous to count red  blood cells, white blood cells, and urine culture positive for E. coli  sensitive to Levaquin.  Chest x-ray negative.   ASSESSMENT AND PLAN:  1. Acute renal failure in the setting of ACE inhibitors, diuretics,      congestive heart failure.  No evidence of any hypotensive spells,      suggest that etiology is probably related to ACE inhibitors and      ARBs, could not rule out obstruction in his left papilla.  We will      check renal ultrasound.  We will check urinalysis.  We will check      serum eosinophils.  We will check urine eosinophils, but acute      interstitial nephritis as the cause.  2. Anemia.  The patient is iron deficient, we will start Venofer 200      mg IV every day for the next 2 to 3 days.   DISPOSITION:  This is a very pleasant 76 year old lady with an episode  of acute renal failure in the setting of diuresis, ACE inhibitor  therapy.  I believe this is the etiology.  She has lost about 12 pounds  since she has been admitted to the hospital in terms of congestive heart  failure.  She appears to be relatively stable.  She is now undergoing a  course of hydration, discontinuation of ACE inhibitors and ARBs I  believe, this lady will be a poor candidate for choice of ACE inhibitors  or ARBs.      Garnetta Buddy, M.D.  Electronically Signed     MWW/MEDQ  D:  11/15/2008  T:  11/16/2008  Job:  [id number]

## 2011-03-11 NOTE — Consult Note (Signed)
NEW PATIENT CONSULTATION   Alyssa Knight, Alyssa Knight  DOB:  [birth year]                                       06/25/2010  ZOXWR#:[id number]   The patient presents today for evaluation of her right foot ischemia.  She is a very pleasant, alert, oriented, active  77 year old black  female.  She had developed an ulceration over the tip of this several  years ago which healed.  Recently she feels that she struck her toe and  has had progressive tissue loss over the tip of her right great toe for  several weeks.  Her great toenail has nearly lifted off and she does  have necrotic debris at the base of her toe.  She had been on a course  of Keflex and now is on Cipro.  She does have claudication type symptoms  but this is not limiting to her.  She underwent noninvasive vascular  laboratory studies at [facility] and I have these  for review.  This was from 08/25 showing an ankle arm index of 0.35  bilaterally.   Her past history is significant for diabetes.  She is not insulin  dependent.  Hypertension, elevated cholesterol, congestive heart failure  in the past and was hospitalized 2 years ago for this.   Prior surgical history is none.   SOCIAL HISTORY:  She is retired, widowed.  She does not smoke or drink  alcohol.   Family history negative for premature atherosclerotic disease.   REVIEW OF SYSTEMS:  Except for as above no weight loss or weight gain.  Her weight is 165 pounds.  She is 5 feet 7 inches tall.  GI:  She does have constipation.  MUSCULOSKELETAL:  Arthritis.  Otherwise review of systems is negative.   PHYSICAL EXAM:  General:  A well-developed, well-nourished black female  appearing younger than stated age of 47.  Vital signs:  Her blood  pressure is 191/70, pulse 82, respirations 18.  She is in no acute  distress.  HEENT:  Normal.  Chest:  Clear bilaterally without rales,  rhonchi or wheezes.  Heart:  Regular rate and rhythm.  Carotid  arteries  without bruits bilaterally.  She has 2+ radial pulses.  She does have  palpable femoral pulses.  I do not palpate popliteal or distal pulses.  Abdomen:  Reveals no masses with mild obesity.  Musculoskeletal:  Shows  no major deformities or cyanosis.  Neurological:  No focal weakness or  paresthesias.  Skin:  Does have open ulceration under the nail bed of  her right great toe, otherwise normal.   I discussed this at length with the patient and family present.  I  explained that I feel that she is certainly at risk for limb loss due to  this toe lesion and severe arterial insufficiency.  I discussed this  with Dr. Jeanmarie Hubert as well.  Her most recent creatinine in his records  was from March of this year with a creatinine of 1.6.  I have  recommended that we proceed with aortogram and right leg arteriogram  with limited dye to determine if she has possibility for  revascularization, to make further recommendations pending this study.  She understands and we have scheduled this next week at her convenience.     Larina Earthly, M.D.  Electronically Signed   TFE/MEDQ  D:  06/25/2010  T:  06/26/2010  Job:  [id number]   cc:   Bryan Lemma. Manus Gunning, M.D.  Richard C. Tuchman, D.P.M.

## 2011-03-14 NOTE — H&P (Signed)
NAMEBRALYN, ESPINO                ACCOUNT NO.:  [id number]   MEDICAL RECORD NO.:  [id number]          PATIENT TYPE:  INP   LOCATION:  2011                         FACILITY:  [facility]   PHYSICIAN:  Melissa L. Ladona Ridgel, MD  DATE OF BIRTH:  [birth year]   DATE OF ADMISSION:  08/18/2006  DATE OF DISCHARGE:                                HISTORY & PHYSICAL   CHIEF COMPLAINT:  Shortness of breath.   PRIMARY CARE PHYSICIAN:  Bryan Lemma. Manus Gunning, M.D.   CARDIOLOGIST:  Lyn Records, M.D.   HISTORY OF PRESENT ILLNESS:  The patient is an 77 year old white female with  a past medical history of CHF, diabetes, and hyperlipidemia.  The patient  was in her usual state of health until today after running around doing  errands most of the day.  She came home and took of her medications for the  day.  Shortly thereafter, the patient noticed that she was experiencing  increasing symptoms of shortness of breath.  The patient denied any  associated chest pain but felt that the shortness of breath was significant  enough to come to the emergency room  for further evaluation.  Please note,  the patient has not had any long distance travel and, at this time in the  emergency room after receiving 80 mg IV Lasix, is clinically not short of  breath.   REVIEW OF SYMPTOMS:  She denies fever, chills, nausea, vomiting, or  diarrhea.  She denies any dysuria, but is noted to have a strong smell from  her urine.  As stated, she has had no long distance travel.  No chest pain.  The patient did have quite a heavy meal of BBQ ribs for lunch which may have  contributed to her shortness of breath.   PAST MEDICAL HISTORY:  CAD, diabetes, hypertension, hypercholesterolemia.   PAST SURGICAL HISTORY:  She had right breast biopsy.   SOCIAL HISTORY:  She denies tobacco and ethanol.   FAMILY HISTORY:  Mom is deceased at the age of 77 of really unknown illness.  Father deceased of unknown condition.   PHYSICAL EXAMINATION:  VITAL SIGNS:  Temperature 98.9, blood pressure 205/84, pulse 88, respiratory  rate 12, saturation 100%.  GENERAL:  She is in no acute distress at present, she states she is much  better since coming to the hospital.  HEENT:  Normocephalic, atraumatic, pupils equal, round, reactive to light,  extraocular movements intact, mucous membranes moist.  NECK:  Supple, no JVD, no lymph nodes, no carotid bruits.  CHEST:  No rales, rhonchi, or wheezes.  CARDIOVASCULAR:  Regular rate and rhythm, positive S1 and S2, no S3 or S4,  no murmurs, gallops, and rubs.  ABDOMEN:  Soft, nontender, nondistended, positive bowel sounds.  EXTREMITIES:  No cyanosis, clubbing, and edema.  NEUROLOGICAL:  She is alert and oriented.  Cranial nerves 2-12 are intact.  Power is 5/5.  DTRs were 2+.  Plantars were downgoing.   LABORATORY DATA:  Sodium 137, potassium 3.7, chloride 105, CO2 26, BUN 20,  creatinine 1.4, glucose 300.  White count 17.4, hemoglobin 11, hematocrit  34, platelets 2225.  Point of care enzymes were negative x2.  Her D-dimer  was 2.60.  PT/INR 15.5 and 1.2.  BNP 988.  Urinalysis shows moderate  leukocyte esterase with 11-20 WBCs.   ASSESSMENT AND PLAN:  This is an 77 year old white female status post acute  onset of CHF with a history of systolic heart failure related to  cardiomyopathy.  Her last ejection fraction was noted in 2001 to be 25%.   1. Cardiovascular:  Acute congestive heart failure secondary to systolic      disease and possible salt indiscretion.  Will continue her on a beta      blocker and aspirin.  I would add an ACE if no contraindications, will      check for old records and check with Dr. Katrinka Blazing.  Her blood pressure      over the course of the hospital stay had improved significantly as she      had taken her medications at home.  Will check a 2D echo to assess wall      motion and EF.  2. Pulmonary:  Congestive heart failure.  She received 80 mg IV Lasix and      continued to  improve.  CT scan of the chest is pending at the time of      the exam to rule out possible pulmonary embolus.  3. GI:  She has no complaints.  4. GU:  UTI is noted on exam, will start her on Cipro and follow up her      cultures, continue to monitor her creatinine status post her CAT scan.  5. Endocrine:  She is diabetic with increased blood sugars.  Will put her      on sliding scale insulin and carb modified diet.  Check her hemoglobin      A1c.  Continue her Glucotrol but, at this time because of the heart      failure, I will hold Actos.  6. DVT prophylaxis.  Consider putting her on Lovenox depending on the      outcome of her CT scan which is pending.  7. Infectious disease.  Will check blood cultures x2 sets and make sure      urine culture is sent and start the patient on Cipro.      Melissa L. Ladona Ridgel, MD  Electronically Signed     MLT/MEDQ  D:  08/19/2006  T:  08/19/2006  Job:  [id number]   cc:   Lyn Records, M.D.  Bryan Lemma. Manus Gunning, M.D.

## 2011-03-14 NOTE — Consult Note (Signed)
NAMERANDYE, TREICHLER NO.:  [id number]   MEDICAL RECORD NO.:  [id number]          PATIENT TYPE:  INP   LOCATION:  2011                         FACILITY:  [facility]   PHYSICIAN:  Corky Crafts, MDDATE OF BIRTH:  01/15/1917   DATE OF CONSULTATION:  08/18/2006  DATE OF DISCHARGE:                                   CONSULTATION   PRIMARY CARE PHYSICIAN:  Dr. Blair Heys.   REASON FOR CONSULTATION:  Congestive heart failure.   HISTORY OF PRESENT ILLNESS:  The patient is an 77 year old with hypertensive  heart disease, mild coronary artery disease and history of systolic  dysfunction diagnosed back to 2001, who yesterday had acute onset of  shortness of breath.  She denied any chest pain.  The shortness of breath  occurred while she was eating ribs.  She then tried to see if the sensation  would spontaneously go away with just walking.  It did not resolve, and she  called 9-1-1.  By the time the ambulance had brought her to the hospital,  she felt much better.  She denied any lightheadedness or syncope.  Upon  arrival, her blood pressure was 205/84.  Currently the patient feels well.  Her shortness of breath is completely resolved.  She is not having any pain.   PAST MEDICAL HISTORY:  1. Coronary artery disease diagnosed by catheterization in 2001.  There      was distal small vessel disease noted in multiple territory.  2. Hypertension.  3. Congestive heart failure.  4. Diabetes.  5. Hypercholesterolemia.   PAST SURGICAL HISTORY:  Breast biopsy.   ALLERGIES:  NO KNOWN DRUG ALLERGIES.   HOME MEDICATIONS:  1. Toprol XL 50 mg daily.  2. Zocor 40 mg daily.  3. Glipizide.  4. Aspirin.  5. Actos.   SOCIAL HISTORY:  The patient does not smoke.  She is not drink.  She lives  by herself.   FAMILY HISTORY:  Noncontributory.   REVIEW OF SYSTEMS:  She was short of breath on admission.  No chest pain.  She has occasional leg pains with walking, but this did  not limit her.  She  noticed a strong odor to her urine.   PHYSICAL EXAMINATION:  VITAL SIGNS:  Blood pressure upon arrival 205/84,  heart rate of 88.  GENERAL APPEARANCE: :  The patient is awake and alert.  No apparent  distress.  HEENT:  Head normocephalic, atraumatic.  Eyes:  Extraocular is intact.  Mouth, oropharynx clear.  NECK:  Bilateral soft carotid bruits.  No JVD.  CARDIOVASCULAR:  Regular rate and rhythm, occasional premature beat.  S1, S2-  S6 systolic murmur.  LUNGS:  Scant bibasilar crackles.  ABDOMEN:  Soft, nontender, nondistended.  EXTREMITIES:  Showed no edema.  NEUROLOGICAL:  No focal motor or sensory  deficits.   LABORATORY DATA:  Preliminary echo results:  EF of 40% with mild to moderate  global LV dysfunction.  BNP result of 998.  White count of 17.4, creatinine  1.4.  Initial troponins were negative.  Subsequent social cardiac enzymes  show CK of 82,  MB of 3.4, troponin 0.33.   MEDICAL DECISION MAKING:  An 77 year old with systolic dysfunction who  presented with shortness of breath and was found to be significantly  hypertensive.   PLAN:  1. CARDIAC:  I think the patient's troponin elevation is likely secondary      to high blood pressure rather than plaque rupture.  I would not      anticoagulate this patient since the patient has no angina.  I would      not plan for cath.  I agree with the current conservative management.  2. Will start lisinopril 5 mg daily for additional blood pressure control.      We will follow the renal function closely given her baseline      creatinine.  She also received contrast dyes.  3. Continue Toprol.  This will be beneficial for her systolic dysfunction.  4. Wean oxygen.  5. Continue Cipro for treatment UTI.  6. I did speak to the patient about trying to limit the amount of salt in      her diet.  She freely admits that she follows no dietary restriction.  7. Will also obtain carotid Doppler's to evaluate her bruits.   8. Will follow the patient while she is in the hospital.      Corky Crafts, MD  Electronically Signed     JSV/MEDQ  D:  08/18/2006  T:  08/19/2006  Job:  [id number]

## 2011-03-14 NOTE — Discharge Summary (Signed)
[place]  Patient:    Alyssa Knight, Alyssa Knight                       MRN: [id number] Adm. Date:  [id number] Disc. Date: 12/13/99 Attending:  Rosanne Sack CC:         [name], M.D., [facility]             Celso Sickle, M.D., [facility]             Rosanne Sack, M.D.                           Discharge Summary  DISCHARGE DIAGNOSES:  1. Uncontrolled, accelerated hypertension complicated with hypertensive     cardiomyopathy.  2. Uncontrolled newly diagnosed type 2 diabetes mellitus.     A. Hemoglobin A1C 10.7%.  3. Hypertensive cardiomyopathy.     A. Two-dimensional echocardiogram (December 10, 1999), ejection fraction less        than 25%, global hypokinesis, mild aortic sclerosis with a good valvular        opening, mild mitral regurgitation, trace tricuspid regurgitation, right        ventricle poorly visualized.     B. Ejection fraction 30-40% by cardiac catheterization, with improved blood        pressure control.  4. Coronary artery disease, diffuse distal vessels.     A. Left anterior descending distal 80% lesion at the apex, left circumflex ith        a proximal 80% lesion and a segmental 80-95% lesion in the obtuse marginal,        the right coronary artery had an 80% mid-posterior descending artery, 95%        lesion in a very small left ventricular branch, 60-70% mid-left ventricular        branch #2.     B. The left ventricle dysfunction was felt to be out of proportion to the        coronary artery disease implying dysfunction secondary to hypertension or        other cardiomyopathic process.  5. Transient acute renal insufficiency secondary to dehydration, resolved.  6. Newly diagnosed hyperlipidemia.     A. Low-density lipoprotein 233, high-density lipoprotein 39, total cholesterol        300, thyroid-stimulating hormone 3.585.  7. History of benign breast lump biopsy a few  years ago.  8. Postmenopausal.  9. ALLERGY to FISH.  DISCHARGE MEDICATIONS:  1. Enteric-coated aspirin 1 tablet p.o. q.d.  2. Lopressor 25 mg p.o. b.i.d.  3. Glucotrol XL 5 mg p.o. q.d.  4. Zestoretic 20/25 one tablet p.o. q.d.  5. Zocor 20 mg p.o. q.d.  DISCHARGE FOLLOWUP AND DISPOSITION:  [name] is being discharged home to the care of her family.  She will be followed at the [facility] on Tuesday,  December 17, 1999 at 10:15 a.m.  During this follow-up visit, Dr. Manus Gunning is to  recheck the patients renal function, liver function tests, blood pressure, and review the CBG ___________.  Dr. Garnette Scheuermann will follow Ms. Schaad within three weeks from the cardiac standpoint.  A diabetic outpatient referral was obtained.  The patient received teaching regarding hypertension and diabetes, hyperlipidemia, and coronary artery disease.  CONSULTATIONS:  Dr. Garnette Scheuermann [facility]).  PROCEDURES:  1. Two-dimensional echocardiogram (see results above).  2. Cardiac catheterization (see results above).  DISCHARGE LABORATORY DATA:  Sodium 137, potassium 3.8, chloride 100, CO2 27, BUN 26, creatinine 1.2, glucose 126.  Hemoglobin A1C 10.7%.  Total cholesterol 300, LDL 233, HDL 39, TSH 3.585.  Hemoglobin 12.9, MCV 83, WBC 10.1, platelets 250. Cardiac enzymes negative x 2.  LFTs within normal limits.  Total bilirubin 1.3.  HOSPITAL COURSE:  Alyssa Knight is a very pleasant, 77 year old, African-American female admitted to [facility] on December 10, 1999 with uncontrolled, accelerated hypertension associated with an abnormal EKG.  Please see admission H&P by Dr. Leodis Sias for further details regarding the history of presentation, physical exam and lab data.  #1 - UNCONTROLLED, ACCELERATED HYPERTENSION ASSOCIATED WITH HYPERTENSIVE CARDIOMYOPATHY:  On admission, the patients blood pressure was 220/115, with a heart rate of 117.  The patient was admitted to a  telemetry bed for blood pressure control and further work-up of the patients abnormal EKG and heart murmur. End-organ damage was worrisome despite the lack of evidence of pulmonary edema. A 2-D echocardiogram was obtained for further cardiac evaluation.  In the meantime, the patient was started on a beta blocker agent, diuretic, and an ACE inhibitor. On day #2, the blood pressure improved to 147/84.  No papilledema or pulmonary edema were noticed.  The 2-D echocardiogram obtained for further cardiac evaluation showed an ejection fraction lower than 25%.  There was a global hypokinesis, unknown significant valvular dysfunction.  Given that the patient had an abnormal EKG that was consistent with Q waves in leads V1, V2, III, and aVF, tall waves n septal leads and a left axis deviation, coronary artery disease was worrisome. The cardiac enzymes were negative.  This concern about significant coronary artery disease was more significant when the patient was confirmed to be a newly diagnosed diabetic and her LDL was 233.  The patient denied any previous history of anginal symptoms or MI.  Given the ejection fraction, a cardiac consult was obtained for further evaluation.  In the meantime, the blood pressure medications were adjusted obtaining an improvement on the patients blood pressure. On the day of discharge, the blood pressure was 140/57.  The goal blood pressure for this patient will be systolics in the 120s and diastolics in the 50-60s.  As described above, a cardiac consult was obtained for further investigation regarding the abnormal EKGs with a very decreased ejection fraction.  Dr. Garnette Scheuermann recommended a cardiac catheterization for evaluation of the patients coronary arteries.  Ms. Merritts went under cardiac cath on December 12, 1999 without complications.  During this cardiac cath, the ejection fraction had improved to  30-40%, thought to be associated with better blood  pressure control.  The coronary arteries showed diffuse distal vessel disease.  See problem list for details regarding the location and degree of these lesions.  Dr. Garnette Scheuermann recommended   medical therapy only with ACE inhibitors, diuretics, beta blockers, and aspirin. He felt that the left ventricle dysfunction was out of proportion to the patients coronary artery disease implying that her left ventricle dysfunction was secondary to hypertension or other cardiomyopathic process.  On the day of discharge, the patients blood pressure was improved.  Her heart rate was 70s.  Further adjustments on the patients antihypertensive agents may be recommended to achieve the blood pressure range described previously.  #2 - NEWLY DIAGNOSED TYPE 2 DIABETES MELLITUS:  Hyperglycemia in the 200 range as noticed on admission.  The hemoglobin A1C was 10.7%.  ADA diet was started. The patient was also started on Glucotrol  XL 5 mg a day.  On this therapy, the patients CBGs improved to 98-160.  We educated the patient about diabetes.  A One-Touch machine was provided.  A diabetic referral was obtained for outpatient followup.  Further adjustments on the patients oral hypoglycemic agents will be  done by Dr. Manus Gunning in [facility].  #3 - NEWLY DIAGNOSED HYPERLIPIDEMIA:  As described previously, the patients lipid profile was worrisome given the degree of elevation of the LDL.  Given the coronary artery disease and the hyperlipidemia, Zocor was started.  Further follow up on the patients liver function tests will be taking place within a week in [facility].  Also, further adjustments on this medication will be done by Dr. Manus Gunning.  #4 - PROBABLE HYPERTENSIVE CARDIOMYOPATHY:  See problem #1.  We decided to discontinued digoxin after the ejection fraction increased to 30-40% with improvement of the blood pressure.  I discussed the medical therapy with Dr. Garnette Scheuermann  who also agreed with using an ACE inhibitor, diuretic agents and beta blocker for now.  Future evaluation of the patients ejection fraction will be dictated y Dr. Garnette Scheuermann.  On the day of discharge, the patient was afebrile and hemodynamically stable. er medical condition was determined improved by the time of discharge.  Please see  discharge followup and disposition section for further details regarding the discharge plan. DD:  12/13/99 TD:  12/13/99 Job: [id number]

## 2011-03-14 NOTE — Discharge Summary (Signed)
[name], Knight NO.:  [id number]   MEDICAL RECORD NO.:  [id number]            PATIENT TYPE:   LOCATION:                                 FACILITY:   PHYSICIAN:  Jackie Plum, M.D.     DATE OF BIRTH:   DATE OF ADMISSION:  DATE OF DISCHARGE:  08/22/2006                               DISCHARGE SUMMARY   DISCHARGE DIAGNOSES:  1. Congestive heart failure, compensated.  2. Kidney failure, improved, deemed secondary to contrast nephropathy.  3. Diabetes mellitus.  4. Coronary artery disease.  5. Hypertension.  6. Hyperlipidemia.   DISCHARGE MEDICATIONS:  1. Actos 1 tablet p.o. daily.  2. Glipizide 1 tablet p.o. q.a.m.  3. Lopressor 1 tablet p.o. b.i.d.  4. Zocor 1 tablet p.o. q.h.s.  5. Aspirin 81 mg p.o. daily.   FOLLOW UP:  The patient is to follow-up with PCP.   CONDITION ON DISCHARGE:  Deemed satisfactory.   HOSPITAL COURSE:  The patient is an 77 year old lady with a history of  CAD/diastolic dysfunction diagnosed in 2001 who presented to the  hospital for acute shortness of breath without any chest pain.  The  patient was admitted with a blood pressure up to approximately 200.  For  full details regarding the patient's admission, please review H&P  dictated by Dr. Derenda Knight on August 18, 2006.  She was admitted  for CHF decompensation secondary to possible salt indiscretion.  On  admission, the patient was seen by the cardiology team.  She was  continued on diuretics with monitoring of electrolytes.  The status was  monitored and adjusted appropriately.  She was noted to have kidney  failure thought to be secondary to contrast-induced nephropathy, which  was managed conservatively with improvement post-cardiac  catheterization.  She was noted to have diastolic dysfunction with EF of  40%.  By August 22, 2006, the patient was seen by Dr. Crista Knight.  On her exam, the patient's blood pressure was 158/71 with lungs being  clear to  auscultation.  She was discharged home with plans to follow-up  with Dr. Annie Knight, the patient's PCP.  Follow-up with Dr. Crista Knight.      Jackie Plum, M.D.  Electronically Signed     GO/MEDQ  D:  08/05/2007  T:  08/05/2007  Job:  [id number]

## 2011-03-14 NOTE — Consult Note (Signed)
[place]  Patient:    Alyssa Knight, Alyssa Knight                       MRN: [id number] Proc. Date: 12/11/99 Adm. Date:  [id number] Attending:  Rosanne Sack CC:         [name], M.D.                          Consultation Report  REASON FOR CONSULTATION:  Abnormal echocardiogram and EKG.  CONCLUSIONS:  1. Accelerated hypertension controlled on medical therapy.     A. Presenting symptom of dizziness.  2. Abnormal echocardiogram.     A. Normal LV cavity size, LVH, and severe anterior and inferior wall motion  abnormality.     B. Ejection fraction 25%.  3. Abnormal electrocardiogram with septal and inferior wall Q waves suggesting     prior infarctions.  4. Newly diagnosed diabetes with hemoglobin A1C noted to be elevated.  5. Severe hypercholesterolemia with a total cholesterol of 300.  RECOMMENDATIONS:  1. Agree with current medical therapy for hypertension.  2. Will recheck renal profile to rule out deterioration.  3. In the absence of cardiac symptoms, I believe the patients most likely     diagnosis is hypertensive heart disease.  Given the appearance of the echo nd     her EKG, however, coronary artery disease must be excluded.  Coronary     angiography seems to be the most reasonable approach to identify the presence     or absence of significant coronary disease.  4. Aspirin 1 per day.  COMMENTS:  The patient is 77 years of age and has been in good health throughout her life.  She is followed by Dr. Manus Gunning.  She was diagnosed with hypertension 2-3 years ago.  She stopped taking the medication approximately 2 years ago.  She had not had a followup with Dr. Manus Gunning in quite some time.  Yesterday she became concerned because of dizziness.  She had a daughter to measure her blood pressure and it was noted to be elevated.  Because of the dizziness, she went [facility] where she was evaluated and found to  have tachycardic, severe blood  pressure elevation, and was felt to have an abnormal EKG suggesting the possibility of coronary disease.  Because of this, she was referred to the ER at [facility] where she was ultimately admitted by Dr. _________ for Dr. Manus Gunning. In speaking with the patient, she had no cardiopulmonary symptoms yesterday and has had none in the preceding years and months.  There is no history of myocardial infarction or heart disease.  ALLERGIES:  None.  SIGNIFICANT PAST MEDICAL PROBLEMS:  1. Hypertension.  2. Postmenopausal status.  3. Hypercholesterolemia.  4. Recently diagnosed diabetes.  FAMILY HISTORY:  A 10 year old son died of myocardial infarction.  HABITS:  She does not smoke or drink.  OBJECTIVE:  GENERAL:  On exam, the patient is in no distress.  VITAL SIGNS:  Blood pressure 152/88, heart rate 80.  SKIN:  Clear.  HEENT:  No xanthelasma, abnormalities, extraocular movement or jaundice.  LUNGS:  Clear.  CARDIAC:  No ________ gallop.  No rub.  No clicks.  ABDOMEN:  Soft.  Liver and spleen are not palpable.  Bowel sounds are normal.  EXTREMITIES:  No edema.  Femoral pulses are 2+ and symmetric.  Posterior tibial and dorsalis pedis  pulses are without significant abnormality.  NEUROLOGIC:  Unremarkable.  LABORATORY:  BUN 21, creatinine 1.1.  Echo is as outlined above.  DISCUSSION:  The absence of symptoms in this patient makes the possibility of coronary disease seem remote.  She is probably suffering from hypertensive heart disease with left ventricular function due to long-standing untreated disease. In the presence, however, of diabetes, the abnormal EKG and the echo findings, coronary disease needs to be excluded.  Coronary angiography will be performed o define coronary anatomy. DD:  12/11/99 TD:  12/11/99 Job: [id number]

## 2013-03-27 HISTORY — PX: FEMUR FRACTURE SURGERY: SHX633

## 2013-04-10 ENCOUNTER — Emergency Department (HOSPITAL_COMMUNITY): Payer: Medicare Other

## 2013-04-10 ENCOUNTER — Encounter (HOSPITAL_COMMUNITY): Payer: Self-pay | Admitting: *Deleted

## 2013-04-10 ENCOUNTER — Inpatient Hospital Stay (HOSPITAL_COMMUNITY)
Admission: EM | Admit: 2013-04-10 | Discharge: 2013-04-16 | DRG: 480 | Disposition: A | Discharge status: Home Health | Payer: Medicare Other | Attending: Internal Medicine | Admitting: Internal Medicine

## 2013-04-10 ENCOUNTER — Inpatient Hospital Stay (HOSPITAL_COMMUNITY): Payer: Medicare Other

## 2013-04-10 ENCOUNTER — Other Ambulatory Visit: Payer: Self-pay | Admitting: Orthopedic Surgery

## 2013-04-10 DIAGNOSIS — S72413A Displaced unspecified condyle fracture of lower end of unspecified femur, initial encounter for closed fracture: Principal | ICD-10-CM | POA: Diagnosis present

## 2013-04-10 DIAGNOSIS — N184 Chronic kidney disease, stage 4 (severe): Secondary | ICD-10-CM | POA: Diagnosis present

## 2013-04-10 DIAGNOSIS — E119 Type 2 diabetes mellitus without complications: Secondary | ICD-10-CM | POA: Diagnosis present

## 2013-04-10 DIAGNOSIS — I5043 Acute on chronic combined systolic (congestive) and diastolic (congestive) heart failure: Secondary | ICD-10-CM | POA: Diagnosis present

## 2013-04-10 DIAGNOSIS — I5021 Acute systolic (congestive) heart failure: Secondary | ICD-10-CM

## 2013-04-10 DIAGNOSIS — S7292XA Unspecified fracture of left femur, initial encounter for closed fracture: Secondary | ICD-10-CM

## 2013-04-10 DIAGNOSIS — I5022 Chronic systolic (congestive) heart failure: Secondary | ICD-10-CM | POA: Diagnosis not present

## 2013-04-10 DIAGNOSIS — D638 Anemia in other chronic diseases classified elsewhere: Secondary | ICD-10-CM | POA: Diagnosis present

## 2013-04-10 DIAGNOSIS — I129 Hypertensive chronic kidney disease with stage 1 through stage 4 chronic kidney disease, or unspecified chronic kidney disease: Secondary | ICD-10-CM | POA: Diagnosis present

## 2013-04-10 DIAGNOSIS — I10 Essential (primary) hypertension: Secondary | ICD-10-CM | POA: Diagnosis present

## 2013-04-10 DIAGNOSIS — I509 Heart failure, unspecified: Secondary | ICD-10-CM | POA: Diagnosis present

## 2013-04-10 DIAGNOSIS — I472 Ventricular tachycardia, unspecified: Secondary | ICD-10-CM | POA: Diagnosis not present

## 2013-04-10 DIAGNOSIS — D62 Acute posthemorrhagic anemia: Secondary | ICD-10-CM | POA: Diagnosis not present

## 2013-04-10 DIAGNOSIS — N289 Disorder of kidney and ureter, unspecified: Secondary | ICD-10-CM

## 2013-04-10 DIAGNOSIS — E875 Hyperkalemia: Secondary | ICD-10-CM | POA: Diagnosis not present

## 2013-04-10 DIAGNOSIS — J95821 Acute postprocedural respiratory failure: Secondary | ICD-10-CM | POA: Diagnosis not present

## 2013-04-10 DIAGNOSIS — J9601 Acute respiratory failure with hypoxia: Secondary | ICD-10-CM | POA: Diagnosis not present

## 2013-04-10 DIAGNOSIS — W010XXA Fall on same level from slipping, tripping and stumbling without subsequent striking against object, initial encounter: Secondary | ICD-10-CM | POA: Diagnosis present

## 2013-04-10 DIAGNOSIS — I4729 Other ventricular tachycardia: Secondary | ICD-10-CM | POA: Diagnosis not present

## 2013-04-10 DIAGNOSIS — N183 Chronic kidney disease, stage 3 unspecified: Secondary | ICD-10-CM | POA: Diagnosis present

## 2013-04-10 DIAGNOSIS — S7290XA Unspecified fracture of unspecified femur, initial encounter for closed fracture: Secondary | ICD-10-CM

## 2013-04-10 DIAGNOSIS — M62838 Other muscle spasm: Secondary | ICD-10-CM | POA: Diagnosis not present

## 2013-04-10 DIAGNOSIS — N179 Acute kidney failure, unspecified: Secondary | ICD-10-CM | POA: Diagnosis not present

## 2013-04-10 DIAGNOSIS — J90 Pleural effusion, not elsewhere classified: Secondary | ICD-10-CM | POA: Diagnosis not present

## 2013-04-10 HISTORY — DX: Essential (primary) hypertension: I10

## 2013-04-10 HISTORY — DX: Type 2 diabetes mellitus without complications: E11.9

## 2013-04-10 HISTORY — DX: Unspecified systolic (congestive) heart failure: I50.20

## 2013-04-10 HISTORY — DX: Chronic kidney disease, unspecified: N18.9

## 2013-04-10 LAB — CBC WITH DIFFERENTIAL/PLATELET
Basophils Absolute: 0 10*3/uL (ref 0.0–0.1)
Basophils Relative: 0 % (ref 0–1)
Eosinophils Absolute: 0.1 10*3/uL (ref 0.0–0.7)
Eosinophils Relative: 1 % (ref 0–5)
HCT: 31.8 % — ABNORMAL LOW (ref 36.0–46.0)
MCH: 28 pg (ref 26.0–34.0)
MCHC: 32.7 g/dL (ref 30.0–36.0)
MCV: 85.5 fL (ref 78.0–100.0)
Monocytes Absolute: 0.8 10*3/uL (ref 0.1–1.0)
Monocytes Relative: 7 % (ref 3–12)
RDW: 15.5 % (ref 11.5–15.5)

## 2013-04-10 LAB — BASIC METABOLIC PANEL
BUN: 28 mg/dL — ABNORMAL HIGH (ref 6–23)
Calcium: 8.9 mg/dL (ref 8.4–10.5)
Creatinine, Ser: 1.49 mg/dL — ABNORMAL HIGH (ref 0.50–1.10)
GFR calc Af Amer: 33 mL/min — ABNORMAL LOW (ref >90)

## 2013-04-10 LAB — PROTIME-INR: Prothrombin Time: 13.9 seconds (ref 11.6–15.2)

## 2013-04-10 MED ORDER — OXYCODONE-ACETAMINOPHEN 5-325 MG PO TABS
1.0000 | ORAL_TABLET | Freq: Once | ORAL | Status: DC
  Filled 2013-04-10: qty 1

## 2013-04-10 MED ORDER — ACETAMINOPHEN 325 MG PO TABS
650.0000 mg | ORAL_TABLET | Freq: Four times a day (QID) | ORAL | Status: DC | PRN
  Administered 2013-04-11: 650 mg via ORAL
  Filled 2013-04-10: qty 2

## 2013-04-10 MED ORDER — MORPHINE SULFATE 2 MG/ML IJ SOLN: 0.5000 mg | INTRAMUSCULAR | Status: DC | PRN

## 2013-04-10 MED ORDER — ENOXAPARIN SODIUM 30 MG/0.3ML ~~LOC~~ SOLN
30.0000 mg | Freq: Every day | SUBCUTANEOUS | Status: DC
  Administered 2013-04-11 – 2013-04-15 (×6): 30 mg via SUBCUTANEOUS
  Filled 2013-04-10 (×8): qty 0.3

## 2013-04-10 MED ORDER — SODIUM CHLORIDE 0.9 % IV SOLN
INTRAVENOUS | Status: AC
  Administered 2013-04-11: via INTRAVENOUS

## 2013-04-10 MED ORDER — INSULIN ASPART 100 UNIT/ML ~~LOC~~ SOLN
0.0000 [IU] | SUBCUTANEOUS | Status: DC
  Administered 2013-04-12: 2 [IU] via SUBCUTANEOUS
  Administered 2013-04-12: 1 [IU] via SUBCUTANEOUS
  Administered 2013-04-12 – 2013-04-13 (×2): 2 [IU] via SUBCUTANEOUS
  Administered 2013-04-13: 1 [IU] via SUBCUTANEOUS

## 2013-04-10 MED ORDER — IBUPROFEN 200 MG PO TABS
200.0000 mg | ORAL_TABLET | Freq: Once | ORAL | Status: AC
  Administered 2013-04-10: 200 mg via ORAL
  Filled 2013-04-10: qty 1

## 2013-04-10 MED ORDER — IBUPROFEN 200 MG PO TABS
600.0000 mg | ORAL_TABLET | Freq: Once | ORAL | Status: DC
  Filled 2013-04-10: qty 3

## 2013-04-10 MED ORDER — SODIUM CHLORIDE 0.9 % IV SOLN: INTRAVENOUS | Status: DC

## 2013-04-10 MED ORDER — HYDROCODONE-ACETAMINOPHEN 5-325 MG PO TABS: 1.0000 | ORAL_TABLET | Freq: Four times a day (QID) | ORAL | Status: DC | PRN

## 2013-04-10 MED ORDER — ISOSORB DINITRATE-HYDRALAZINE 20-37.5 MG PO TABS
1.0000 | ORAL_TABLET | Freq: Two times a day (BID) | ORAL | Status: DC
  Administered 2013-04-11: 1 via ORAL
  Filled 2013-04-10 (×3): qty 1

## 2013-04-10 NOTE — ED Notes (Signed)
Report attempted x 1

## 2013-04-10 NOTE — ED Notes (Signed)
The pt tripped and fell this am.  No loc.  She has bruised swollen lt knee and she is unable to walk

## 2013-04-10 NOTE — Progress Notes (Signed)
Orthopedic Tech Progress Note Patient Details:  Alyssa Knight [birth year] [id number] RN to call for overhead frame after surgery. Patient ID: Alyssa Knight, female   DOB: 02-13-1917, 79 y.o.   MRN: [id number]   Alyssa Knight 04/10/2013, 10:24 PM

## 2013-04-10 NOTE — Consult Note (Signed)
Reason for Consult: Left knee pain Referring Physician: EDP  HPI: Alyssa Knight is an 79 y.o. female living independently with here son and daughter in law, fell at home earlier today with complaints of left knee pain and inability to bear weight. reports minimal pain at  Rest. No other musculoskeletal complaints  PMH most significant for HTN, DM type 2, chronic kidney disease and chronic systolic congestive heart failure     Past Medical History  Diagnosis Date  . Diabetes mellitus without complication     History reviewed. No pertinent past surgical history.  No family history on file.  Social History:  reports that she has never smoked. She does not have any smokeless tobacco history on file. She reports that  drinks alcohol. Her drug history is not on file.  Allergies:  Allergies  Allergen Reactions  . Actos (Pioglitazone) Other (See Comments)    May have dropped blood sugar too much-family unsure    Medications: I have reviewed the patient's current medications.  Results for orders placed during the hospital encounter of 04/10/13 (from the past 48 hour(s))  CBC WITH DIFFERENTIAL     Status: Abnormal   Collection Time    04/10/13  6:41 PM      Result Value Range   WBC 10.4  4.0 - 10.5 K/uL   RBC 3.72 (*) 3.87 - 5.11 MIL/uL   Hemoglobin 10.4 (*) 12.0 - 15.0 g/dL   HCT 96.0 (*) 45.4 - 09.8 %   MCV 85.5  78.0 - 100.0 fL   MCH 28.0  26.0 - 34.0 pg   MCHC 32.7  30.0 - 36.0 g/dL   RDW 11.9  14.7 - 82.9 %   Platelets 205  150 - 400 K/uL   Neutrophils Relative % 84 (*) 43 - 77 %   Neutro Abs 8.7 (*) 1.7 - 7.7 K/uL   Lymphocytes Relative 9 (*) 12 - 46 %   Lymphs Abs 0.9  0.7 - 4.0 K/uL   Monocytes Relative 7  3 - 12 %   Monocytes Absolute 0.8  0.1 - 1.0 K/uL   Eosinophils Relative 1  0 - 5 %   Eosinophils Absolute 0.1  0.0 - 0.7 K/uL   Basophils Relative 0  0 - 1 %   Basophils Absolute 0.0  0.0 - 0.1 K/uL  BASIC METABOLIC PANEL     Status: Abnormal   Collection Time     04/10/13  6:41 PM      Result Value Range   Sodium 137  135 - 145 mEq/L   Potassium 4.3  3.5 - 5.1 mEq/L   Chloride 101  96 - 112 mEq/L   CO2 28  19 - 32 mEq/L   Glucose, Bld 166 (*) 70 - 99 mg/dL   BUN 28 (*) 6 - 23 mg/dL   Creatinine, Ser 5.62 (*) 0.50 - 1.10 mg/dL   Calcium 8.9  8.4 - 13.0 mg/dL   GFR calc non Af Amer 29 (*) >90 mL/min   GFR calc Af Amer 33 (*) >90 mL/min   Comment:            The eGFR has been calculated     using the CKD EPI equation.     This calculation has not been     validated in all clinical     situations.     eGFR's persistently     <90 mL/min signify     possible Chronic Kidney Disease.  PROTIME-INR  Status: None   Collection Time    04/10/13  6:41 PM      Result Value Range   Prothrombin Time 13.9  11.6 - 15.2 seconds   INR 1.08  0.00 - 1.49  TYPE AND SCREEN     Status: None   Collection Time    04/10/13  6:50 PM      Result Value Range   ABO/RH(D) O POS     Antibody Screen NEG     Sample Expiration 04/13/2013      Dg Knee Complete 4 Views Left  04/10/2013   *RADIOLOGY REPORT*  Clinical Data: Pain post fall  LEFT KNEE - COMPLETE 4+ VIEW  Comparison: None  Findings: Four views of the left knee submitted.  There is displaced fracture of distal left femur lateral femoral condyle. Moderate joint effusion.  Spurring of patella.  IMPRESSION: Displaced fracture of distal left femur lateral femoral condyle.   Original Report Authenticated By: Natasha Mead, M.D.     Vitals Temp:  [97.5 F (36.4 C)-98.6 F (37 C)] 97.5 F (36.4 C) (06/15 2205) Pulse Rate:  [70-110] 102 (06/15 2205) Resp:  [0-30] 18 (06/15 2205) BP: (133-163)/(59-84) 149/83 mmHg (06/15 2205) SpO2:  [94 %-99 %] 99 % (06/15 2205) There is no height or weight on file to calculate BMI.  Physical Exam: Thin cooperative elderly BF, A and O. [place]/AT. UE's no gross bone or joint instability. RLE stable and nontender. LLE with diffuse swelling about knee, moderate effusion,  compartments soft. N/V intact distally  Xray with displaced left lateral femoral condyle fracture.     Assessment/Plan: Impression: Left lateral femoral condyle fracture displaced Treatment: I have counseled Ms Turvey and her family on treatment options and risks vs benefits thereof. Considering degree of displacement I recommend ORIF. They understand and accept and agree with plan. Anticipate OR Monday afternoon. NPO after MN   Daya Dutt M 04/10/2013, 10:50 PM

## 2013-04-10 NOTE — ED Provider Notes (Signed)
History     CSN: [id number]  Arrival date & time 04/10/13  1546   First MD Initiated Contact with Patient 04/10/13 1650      Chief Complaint  Patient presents with  . Fall    (Consider location/radiation/quality/duration/timing/severity/associated sxs/prior treatment) The history is provided by the patient.  Alyssa Knight is a 79 y.o. female hx of DM here with fall. She was doing laundry and tripped and fell and landed on left knee. She was unable to walk afterwards. Denies head injury or LOC or other injuries.     Past Medical History  Diagnosis Date  . Diabetes mellitus without complication     History reviewed. No pertinent past surgical history.  No family history on file.  History  Substance Use Topics  . Smoking status: Never Smoker   . Smokeless tobacco: Not on file  . Alcohol Use: Yes    OB History   Grav Para Term Preterm Abortions TAB SAB Ect Mult Living                  Review of Systems  Musculoskeletal:       L knee pain   All other systems reviewed and are negative.    Allergies  Actos  Home Medications   Current Outpatient Rx  Name  Route  Sig  Dispense  Refill  . aspirin EC 81 MG tablet   Oral   Take 81 mg by mouth daily.         . furosemide (LASIX) 40 MG tablet   Oral   Take 40 mg by mouth daily.         . isosorbide-hydrALAZINE (BIDIL) 20-37.5 MG per tablet   Oral   Take 1 tablet by mouth 2 (two) times daily.         . Multiple Vitamins-Minerals (CENTRUM SILVER PO)   Oral   Take 1 tablet by mouth daily.           BP 141/81  Pulse 110  Temp(Src) 97.7 F (36.5 C)  Resp 18  SpO2 99%  Physical Exam  Nursing note and vitals reviewed. Constitutional: She is oriented to person, place, and time. She appears well-developed and well-nourished.  Old but well appearing   HENT:  Head: Normocephalic and atraumatic.  Mouth/Throat: Oropharynx is clear and moist.  Eyes: Conjunctivae are normal. Pupils are equal, round,  and reactive to light.  Neck: Normal range of motion. Neck supple.  No evidence of neck injury   Cardiovascular: Normal rate, regular rhythm and normal heart sounds.   Pulmonary/Chest: Effort normal and breath sounds normal. No respiratory distress. She has no wheezes. She has no rales.  Abdominal: Soft. Bowel sounds are normal. She exhibits no distension. There is no tenderness. There is no rebound.  Musculoskeletal:  L knee tender with effusion laterally. Knee stable. L hip nontender and nl ROM. 2+ distal pulses   Neurological: She is alert and oriented to person, place, and time.  Skin: Skin is warm and dry.  Psychiatric: She has a normal mood and affect. Her behavior is normal. Judgment and thought content normal.    ED Course  Procedures (including critical care time)  Labs Reviewed  CBC WITH DIFFERENTIAL - Abnormal; Notable for the following:    RBC 3.72 (*)    Hemoglobin 10.4 (*)    HCT 31.8 (*)    Neutrophils Relative % 84 (*)    Neutro Abs 8.7 (*)    Lymphocytes Relative 9 (*)  All other components within normal limits  BASIC METABOLIC PANEL - Abnormal; Notable for the following:    Glucose, Bld 166 (*)    BUN 28 (*)    Creatinine, Ser 1.49 (*)    GFR calc non Af Amer 29 (*)    GFR calc Af Amer 33 (*)    All other components within normal limits  PROTIME-INR  TYPE AND SCREEN   Dg Knee Complete 4 Views Left  04/10/2013   *RADIOLOGY REPORT*  Clinical Data: Pain post fall  LEFT KNEE - COMPLETE 4+ VIEW  Comparison: None  Findings: Four views of the left knee submitted.  There is displaced fracture of distal left femur lateral femoral condyle. Moderate joint effusion.  Spurring of patella.  IMPRESSION: Displaced fracture of distal left femur lateral femoral condyle.   Original Report Authenticated By: Natasha Mead, M.D.     No diagnosis found.   Date: 04/10/2013  Rate: 103  Rhythm: sinus tachycardia and premature ventricular contractions (PVC)  QRS Axis: normal   Intervals: normal  ST/T Wave abnormalities: nonspecific ST changes  Conduction Disutrbances:none  Narrative Interpretation:   Old EKG Reviewed: none available     MDM  Alyssa Knight is a 79 y.o. female here with L knee pain. Xray showed displaced L distal femur fracture. I called Dr. Rennis Chris, who will see the patient and plan on doing surgery. I called [facility] to admit.          Richardean Canal, MD 04/10/13 [phone]

## 2013-04-10 NOTE — H&P (Signed)
History and Physical  Alyssa Knight ZOX:[id number] DOB: 1917-04-25 DOA: 04/10/2013  Referring physician: Dr. Smith Robert PCP: No primary provider on file.   Chief Complaint: Left leg pain  HPI: Patient is 79 year old female with PMH most significant for HTN, DM type 2, chronic kidney disease and chronic systolic congestive heart failure who tripped while changing her clothes and felt on her left side. She immediately started having left leg pain and swelling in left knee.  She was brought in the ER by her relatives and was diagnosed to have a lower femur fracture. Patient complains of pain only when she moves her left leg.  Denies chest pain, shortness of breath, constipation, change in urinary habits. She is able to walk a block before she gets short of breath. She lives with her daughter who works as a Chiropodist in [employer].    Review of Systems:  15 point review of system is negative except what is noted above in the HPI.  Past Medical History  Diagnosis Date  . Diabetes mellitus without complication   rest as noted above in the HPI.  History reviewed. No pertinent past surgical history.  Social History:  reports that she has never smoked. She does not have any smokeless tobacco history on file. She reports that  drinks alcohol. Her drug history is not on file.  Allergies  Allergen Reactions  . Actos (Pioglitazone) Other (See Comments)    May have dropped blood sugar too much-family unsure    Family history is non contributory.  Prior to Admission medications   Medication Sig Start Date End Date Taking? Authorizing Provider  aspirin EC 81 MG tablet Take 81 mg by mouth daily.   Yes Historical Provider, MD  furosemide (LASIX) 40 MG tablet Take 40 mg by mouth daily.   Yes Historical Provider, MD  isosorbide-hydrALAZINE (BIDIL) 20-37.5 MG per tablet Take 1 tablet by mouth 2 (two) times daily.   Yes Historical Provider, MD  Multiple Vitamins-Minerals (CENTRUM SILVER PO) Take 1 tablet by  mouth daily.   Yes Historical Provider, MD   Physical Exam: Filed Vitals:   04/10/13 1607 04/10/13 1805 04/10/13 2036  BP: 158/59 141/81 163/84  Pulse: 70 110 89  Temp: 97.7 F (36.5 C)  98.6 F (37 C)  TempSrc:   Oral  Resp: 20 18 12   SpO2: 97% 99% 96%  Physical Exam: General: Vital signs reviewed and noted. Well-developed, well-nourished, in no acute distress; alert, appropriate and cooperative throughout examination.  Head: Normocephalic, atraumatic.  Eyes: PERRL, EOMI, No signs of anemia or jaundince.  Nose: Mucous membranes moist, not inflammed, nonerythematous.  Throat: Oropharynx nonerythematous, no exudate appreciated.   Neck: No deformities, masses, or tenderness noted.Supple, No carotid Bruits, no JVD.  Lungs:  Normal respiratory effort. Clear to auscultation BL without crackles or wheezes.  Heart: RRR. S1 and S2 normal without gallop, murmur, or rubs.  Abdomen:  BS normoactive. Soft, Nondistended, non-tender.  No masses or organomegaly.  Extremities: Trace edema bilaterally upto ankles, left knee joint swollen and ttp, left great toe has healing wound  Neurologic: A&O X3, CN II - XII are grossly intact. Motor strength is 5/5 in the all 4 extremities, Sensations intact to light touch, Cerebellar signs negative.  Skin: No visible rashes, scars.    Wt Readings from Last 3 Encounters:  No data found for Wt    Labs on Admission:  Basic Metabolic Panel:  Recent Labs Lab 04/10/13 1841  NA 137  K 4.3  CL 101  CO2 28  GLUCOSE 166*  BUN 28*  CREATININE 1.49*  CALCIUM 8.9    CBC:  Recent Labs Lab 04/10/13 1841  WBC 10.4  NEUTROABS 8.7*  HGB 10.4*  HCT 31.8*  MCV 85.5  PLT 205     Radiological Exams on Admission: Dg Knee Complete 4 Views Left  04/10/2013   *RADIOLOGY REPORT*  Clinical Data: Pain post fall  LEFT KNEE - COMPLETE 4+ VIEW  Comparison: None  Findings: Four views of the left knee submitted.  There is displaced fracture of distal left femur  lateral femoral condyle. Moderate joint effusion.  Spurring of patella.  IMPRESSION: Displaced fracture of distal left femur lateral femoral condyle.   Original Report Authenticated By: Natasha Mead, M.D.    EKG: Independently reviewed. 103 beats per minute, left axis deviation, sinus rhythm, Q waves noted in the anterior leads and in the inferior leads which suggests previous MI. Diffuse T-wave flattening but no previous EKG for comparison. Principal Problem:   Femur fracture, left Active Problems:   HTN (hypertension)   Congestive heart failure   Chronic renal insufficiency, stage III (moderate)   Diabetes type 2, controlled   Assessment/Plan Patient is a 79 year old female with past medical history as noted above who fell after she tripped in the bathroom and broke her left femur. Patient was seen by Dr. Rennis Chris plan on doing the surgery. Based on the revised cardiovascular risk index:  Revised cardiac risk index - To simplify the prediction of risk, Elonda Husky monitored 2893 patients (mean age 69) undergoing elective major noncardiac procedures and identified six independent predictors of major cardiac complications (defined as myocardial infarction, pulmonary edema, ventricular fibrillation or primary cardiac arrest, and complete heart block; all cause mortality was not included): High-risk type of surgery (intraperitoneal, intrathoracic, or suprainguinal vascular surgery)  History of ischemic heart disease (history of MI or a positive exercise test, current complaint of chest pain considered to be secondary to myocardial ischemia, use of nitrate therapy, or ECG with pathological Q waves; do not count prior coronary revascularization procedure unless one of the other criteria for ischemic heart disease is present)  History of HF  History of cerebrovascular disease  Diabetes mellitus requiring treatment with insulin  Preoperative serum creatinine >2.0 mg/dL (161 mol/L)  Patient has history of  heart failure, diabetes which gives her 2 points and hence her risk of cardiovascular arrest during surgery his intermediate.  I have kept the patient on isosorbide hydralazine(BiDil) for blood pressure control. Last 2-D echocardiogram ordered was from 2010 and and suggested an ejection fraction at 40-45%. I will hold Lasix at this time but also would not give her any more fluids. I have started the patient on sliding scale insulin. Pain control with Vicodin and ibuprofen as needed. Patient was also started on enoxaparin to prevent DVT.   Patient's daughter was in the room and I answered all the questions and concerns.       Code Status: Full code Family Communication: Updated at bedside Disposition Plan/Anticipated LOS: 3-4 days  Time spent: 75 minutes  Lars Mage, MD  [facility] Team 5  If 7PM-7AM, please contact night-coverage at [email or web address], password [facility] 04/10/2013, 8:43 PM

## 2013-04-10 NOTE — Progress Notes (Signed)
Family and patient request that catheter NOT be inserted. Pt is voiding well on fracture bed pan.

## 2013-04-11 ENCOUNTER — Encounter (HOSPITAL_COMMUNITY): Payer: Self-pay | Admitting: Certified Registered"

## 2013-04-11 ENCOUNTER — Encounter (HOSPITAL_COMMUNITY)
Admission: EM | Disposition: A | Discharge status: Home Health | Payer: Self-pay | Source: Home / Self Care | Attending: Internal Medicine

## 2013-04-11 ENCOUNTER — Inpatient Hospital Stay (HOSPITAL_COMMUNITY): Payer: Medicare Other | Admitting: Anesthesiology

## 2013-04-11 ENCOUNTER — Other Ambulatory Visit: Payer: Self-pay | Admitting: Orthopedic Surgery

## 2013-04-11 ENCOUNTER — Inpatient Hospital Stay (HOSPITAL_COMMUNITY): Payer: Medicare Other

## 2013-04-11 ENCOUNTER — Encounter (HOSPITAL_COMMUNITY): Payer: Self-pay | Admitting: Anesthesiology

## 2013-04-11 DIAGNOSIS — J9600 Acute respiratory failure, unspecified whether with hypoxia or hypercapnia: Secondary | ICD-10-CM

## 2013-04-11 DIAGNOSIS — J9601 Acute respiratory failure with hypoxia: Secondary | ICD-10-CM | POA: Diagnosis not present

## 2013-04-11 DIAGNOSIS — I509 Heart failure, unspecified: Secondary | ICD-10-CM

## 2013-04-11 DIAGNOSIS — I5021 Acute systolic (congestive) heart failure: Secondary | ICD-10-CM

## 2013-04-11 DIAGNOSIS — N183 Chronic kidney disease, stage 3 (moderate): Secondary | ICD-10-CM

## 2013-04-11 DIAGNOSIS — S7290XA Unspecified fracture of unspecified femur, initial encounter for closed fracture: Secondary | ICD-10-CM

## 2013-04-11 DIAGNOSIS — I5022 Chronic systolic (congestive) heart failure: Secondary | ICD-10-CM | POA: Diagnosis not present

## 2013-04-11 DIAGNOSIS — E119 Type 2 diabetes mellitus without complications: Secondary | ICD-10-CM

## 2013-04-11 HISTORY — PX: ORIF FEMUR FRACTURE: SHX2119

## 2013-04-11 LAB — URINALYSIS, ROUTINE W REFLEX MICROSCOPIC
Glucose, UA: NEGATIVE mg/dL
Protein, ur: 30 mg/dL — AB
pH: 5 (ref 5.0–8.0)

## 2013-04-11 LAB — URINE MICROSCOPIC-ADD ON

## 2013-04-11 LAB — CBC
MCH: 27.3 pg (ref 26.0–34.0)
MCHC: 32.4 g/dL (ref 30.0–36.0)
MCV: 85.3 fL (ref 78.0–100.0)
MCV: 85.2 fL (ref 78.0–100.0)
Platelets: 183 10*3/uL (ref 150–400)
Platelets: 171 10*3/uL (ref 150–400)
RDW: 15.7 % — ABNORMAL HIGH (ref 11.5–15.5)
RDW: 15.7 % — ABNORMAL HIGH (ref 11.5–15.5)
WBC: 7.2 10*3/uL (ref 4.0–10.5)

## 2013-04-11 LAB — GLUCOSE, CAPILLARY
Glucose-Capillary: 137 mg/dL — ABNORMAL HIGH (ref 70–99)
Glucose-Capillary: 148 mg/dL — ABNORMAL HIGH (ref 70–99)
Glucose-Capillary: 85 mg/dL (ref 70–99)
Glucose-Capillary: 88 mg/dL (ref 70–99)
Glucose-Capillary: 93 mg/dL (ref 70–99)

## 2013-04-11 LAB — BLOOD GAS, ARTERIAL
Drawn by: 34779
MECHVT: 500 mL
O2 Saturation: 99.2 %
PEEP: 5 cmH2O
Patient temperature: 98.6
RATE: 12 resp/min

## 2013-04-11 LAB — BASIC METABOLIC PANEL
Chloride: 104 mEq/L (ref 96–112)
Creatinine, Ser: 1.52 mg/dL — ABNORMAL HIGH (ref 0.50–1.10)
GFR calc Af Amer: 33 mL/min — ABNORMAL LOW (ref >90)
GFR calc non Af Amer: 28 mL/min — ABNORMAL LOW (ref >90)

## 2013-04-11 LAB — HEMOGLOBIN A1C: Hgb A1c MFr Bld: 6.1 % — ABNORMAL HIGH (ref <5.7)

## 2013-04-11 LAB — SURGICAL PCR SCREEN: MRSA, PCR: NEGATIVE

## 2013-04-11 SURGERY — OPEN REDUCTION INTERNAL FIXATION (ORIF) DISTAL FEMUR FRACTURE
Anesthesia: General | Site: Leg Upper | Laterality: Left | Wound class: Clean

## 2013-04-11 MED ORDER — GLYCOPYRROLATE 0.2 MG/ML IJ SOLN
INTRAMUSCULAR | Status: DC | PRN
  Administered 2013-04-11 (×2): 0.2 mg via INTRAVENOUS

## 2013-04-11 MED ORDER — PHENYLEPHRINE HCL 10 MG/ML IJ SOLN
INTRAMUSCULAR | Status: DC | PRN
  Administered 2013-04-11: 160 ug via INTRAVENOUS
  Administered 2013-04-11 (×2): 120 ug via INTRAVENOUS
  Administered 2013-04-11 (×2): 160 ug via INTRAVENOUS

## 2013-04-11 MED ORDER — PANTOPRAZOLE SODIUM 40 MG IV SOLR
40.0000 mg | INTRAVENOUS | Status: DC
  Administered 2013-04-12: 40 mg via INTRAVENOUS
  Filled 2013-04-11 (×3): qty 40

## 2013-04-11 MED ORDER — ACETAMINOPHEN 650 MG RE SUPP: 650.0000 mg | Freq: Four times a day (QID) | RECTAL | Status: DC | PRN

## 2013-04-11 MED ORDER — ARTIFICIAL TEARS OP OINT
TOPICAL_OINTMENT | OPHTHALMIC | Status: DC | PRN
  Administered 2013-04-11: 1 via OPHTHALMIC

## 2013-04-11 MED ORDER — CHLORHEXIDINE GLUCONATE 4 % EX LIQD
60.0000 mL | Freq: Once | CUTANEOUS | Status: AC
  Administered 2013-04-11: 4 via TOPICAL
  Filled 2013-04-11: qty 60

## 2013-04-11 MED ORDER — FENTANYL CITRATE 0.05 MG/ML IJ SOLN
INTRAMUSCULAR | Status: DC | PRN
  Administered 2013-04-11: 100 ug via INTRAVENOUS

## 2013-04-11 MED ORDER — FUROSEMIDE 10 MG/ML IJ SOLN
20.0000 mg | Freq: Two times a day (BID) | INTRAMUSCULAR | Status: AC
  Administered 2013-04-12: 20 mg via INTRAVENOUS
  Filled 2013-04-11: qty 2

## 2013-04-11 MED ORDER — 0.9 % SODIUM CHLORIDE (POUR BTL) OPTIME
TOPICAL | Status: DC | PRN
  Administered 2013-04-11: 1000 mL

## 2013-04-11 MED ORDER — DOCUSATE SODIUM 100 MG PO CAPS
100.0000 mg | ORAL_CAPSULE | Freq: Two times a day (BID) | ORAL | Status: DC
  Administered 2013-04-12 – 2013-04-16 (×8): 100 mg via ORAL
  Filled 2013-04-11 (×11): qty 1

## 2013-04-11 MED ORDER — ONDANSETRON HCL 4 MG PO TABS: 4.0000 mg | ORAL_TABLET | Freq: Four times a day (QID) | ORAL | Status: DC | PRN

## 2013-04-11 MED ORDER — MENTHOL 3 MG MT LOZG
1.0000 | LOZENGE | OROMUCOSAL | Status: DC | PRN
  Filled 2013-04-11: qty 9

## 2013-04-11 MED ORDER — SODIUM CHLORIDE 0.9 % IV SOLN
10.0000 mg | INTRAVENOUS | Status: DC | PRN
  Administered 2013-04-11: 20 ug/min via INTRAVENOUS

## 2013-04-11 MED ORDER — NEOSTIGMINE METHYLSULFATE 1 MG/ML IJ SOLN
INTRAMUSCULAR | Status: DC | PRN
  Administered 2013-04-11: 2 mg via INTRAVENOUS

## 2013-04-11 MED ORDER — PROPOFOL 10 MG/ML IV BOLUS
INTRAVENOUS | Status: DC | PRN
  Administered 2013-04-11: 100 mg via INTRAVENOUS

## 2013-04-11 MED ORDER — ACETAMINOPHEN 325 MG PO TABS
650.0000 mg | ORAL_TABLET | Freq: Four times a day (QID) | ORAL | Status: DC | PRN
  Administered 2013-04-12 – 2013-04-16 (×5): 650 mg via ORAL
  Filled 2013-04-11 (×5): qty 2

## 2013-04-11 MED ORDER — LIDOCAINE HCL (CARDIAC) 20 MG/ML IV SOLN
INTRAVENOUS | Status: DC | PRN
  Administered 2013-04-11: 90 mg via INTRAVENOUS

## 2013-04-11 MED ORDER — METHOCARBAMOL 100 MG/ML IJ SOLN
500.0000 mg | Freq: Four times a day (QID) | INTRAVENOUS | Status: DC | PRN
  Filled 2013-04-11: qty 5

## 2013-04-11 MED ORDER — METHYLPREDNISOLONE SODIUM SUCC 40 MG IJ SOLR
40.0000 mg | Freq: Two times a day (BID) | INTRAMUSCULAR | Status: DC
  Administered 2013-04-12 – 2013-04-13 (×3): 40 mg via INTRAVENOUS
  Filled 2013-04-11 (×6): qty 1

## 2013-04-11 MED ORDER — METOCLOPRAMIDE HCL 10 MG PO TABS: 5.0000 mg | ORAL_TABLET | Freq: Three times a day (TID) | ORAL | Status: DC | PRN

## 2013-04-11 MED ORDER — METHOCARBAMOL 500 MG PO TABS
500.0000 mg | ORAL_TABLET | Freq: Four times a day (QID) | ORAL | Status: DC | PRN
  Filled 2013-04-11: qty 1

## 2013-04-11 MED ORDER — LACTATED RINGERS IV SOLN
INTRAVENOUS | Status: DC
  Administered 2013-04-11 (×2): via INTRAVENOUS

## 2013-04-11 MED ORDER — CHLORHEXIDINE GLUCONATE 0.12 % MT SOLN
15.0000 mL | Freq: Two times a day (BID) | OROMUCOSAL | Status: DC
  Administered 2013-04-11 – 2013-04-12 (×2): 15 mL via OROMUCOSAL
  Filled 2013-04-11 (×2): qty 15

## 2013-04-11 MED ORDER — FENTANYL CITRATE 0.05 MG/ML IJ SOLN
25.0000 ug | INTRAMUSCULAR | Status: DC | PRN
Start: 2013-04-11 — End: 2013-04-16
  Administered 2013-04-11 – 2013-04-12 (×2): 50 ug via INTRAVENOUS
  Administered 2013-04-12: 25 ug via INTRAVENOUS
  Filled 2013-04-11 (×4): qty 2

## 2013-04-11 MED ORDER — FENTANYL CITRATE 0.05 MG/ML IJ SOLN: 25.0000 ug | INTRAMUSCULAR | Status: DC | PRN

## 2013-04-11 MED ORDER — DEXTROSE-NACL 5-0.45 % IV SOLN
INTRAVENOUS | Status: DC
  Administered 2013-04-11 – 2013-04-13 (×2): via INTRAVENOUS

## 2013-04-11 MED ORDER — PHENOL 1.4 % MT LIQD
1.0000 | OROMUCOSAL | Status: DC | PRN
  Filled 2013-04-11: qty 177

## 2013-04-11 MED ORDER — ONDANSETRON HCL 4 MG/2ML IJ SOLN: 4.0000 mg | Freq: Four times a day (QID) | INTRAMUSCULAR | Status: DC | PRN

## 2013-04-11 MED ORDER — METOCLOPRAMIDE HCL 5 MG/ML IJ SOLN
5.0000 mg | Freq: Three times a day (TID) | INTRAMUSCULAR | Status: DC | PRN
  Filled 2013-04-11: qty 2

## 2013-04-11 MED ORDER — ONDANSETRON HCL 4 MG/2ML IJ SOLN
INTRAMUSCULAR | Status: DC | PRN
  Administered 2013-04-11: 4 mg via INTRAVENOUS

## 2013-04-11 MED ORDER — ROCURONIUM BROMIDE 100 MG/10ML IV SOLN
INTRAVENOUS | Status: DC | PRN
  Administered 2013-04-11: 30 mg via INTRAVENOUS

## 2013-04-11 MED ORDER — CEFAZOLIN SODIUM-DEXTROSE 2-3 GM-% IV SOLR
2.0000 g | Freq: Once | INTRAVENOUS | Status: AC
  Administered 2013-04-11: 2 g via INTRAVENOUS

## 2013-04-11 MED ORDER — CEFAZOLIN SODIUM-DEXTROSE 2-3 GM-% IV SOLR
2.0000 g | Freq: Four times a day (QID) | INTRAVENOUS | Status: AC
  Administered 2013-04-11 – 2013-04-12 (×2): 2 g via INTRAVENOUS
  Filled 2013-04-11 (×2): qty 50

## 2013-04-11 MED ORDER — BIOTENE DRY MOUTH MT LIQD
15.0000 mL | Freq: Four times a day (QID) | OROMUCOSAL | Status: DC
  Administered 2013-04-12 (×4): 15 mL via OROMUCOSAL

## 2013-04-11 MED ORDER — ASPIRIN EC 325 MG PO TBEC
325.0000 mg | DELAYED_RELEASE_TABLET | Freq: Every day | ORAL | Status: DC
  Administered 2013-04-13 – 2013-04-16 (×4): 325 mg via ORAL
  Filled 2013-04-11 (×7): qty 1

## 2013-04-11 MED ORDER — POLYETHYLENE GLYCOL 3350 17 G PO PACK
17.0000 g | PACK | Freq: Every day | ORAL | Status: DC | PRN
  Administered 2013-04-13 – 2013-04-14 (×2): 17 g via ORAL
  Filled 2013-04-11 (×2): qty 1

## 2013-04-11 SURGICAL SUPPLY — 85 items
BANDAGE ELASTIC 4 VELCRO ST LF (GAUZE/BANDAGES/DRESSINGS) IMPLANT
BANDAGE ELASTIC 6 VELCRO ST LF (GAUZE/BANDAGES/DRESSINGS) ×2 IMPLANT
BANDAGE GAUZE ELAST BULKY 4 IN (GAUZE/BANDAGES/DRESSINGS) IMPLANT
BIT DRILL 3.2 CALIBRATED (BIT) ×1
BIT DRILL 3.2MM CALIBRATED (BIT) ×1 IMPLANT
BIT DRILL 3.8 CALIBRATED (BIT) ×1
BIT DRILL 3.8MM CALIBRATED (BIT) ×1 IMPLANT
BLADE SURG 10 STRL SS (BLADE) ×2 IMPLANT
BLADE SURG ROTATE 9660 (MISCELLANEOUS) IMPLANT
BNDG COHESIVE 6X5 TAN STRL LF (GAUZE/BANDAGES/DRESSINGS) ×2 IMPLANT
CLEANER TIP ELECTROSURG 2X2 (MISCELLANEOUS) ×2 IMPLANT
CLOTH BEACON ORANGE TIMEOUT ST (SAFETY) ×2 IMPLANT
COVER MAYO STAND STRL (DRAPES) ×2 IMPLANT
CUFF TOURNIQUET SINGLE 34IN LL (TOURNIQUET CUFF) ×2 IMPLANT
CUFF TOURNIQUET SINGLE 44IN (TOURNIQUET CUFF) IMPLANT
DRAPE C-ARM 42X72 X-RAY (DRAPES) ×2 IMPLANT
DRAPE C-ARMOR (DRAPES) ×2 IMPLANT
DRAPE INCISE IOBAN 66X45 STRL (DRAPES) ×4 IMPLANT
DRAPE U-SHAPE 47X51 STRL (DRAPES) ×2 IMPLANT
DRILL BIT 3.2MM CALIBRATED (BIT) ×1
DRILL BIT 3.8MM CALIBRATED (BIT) ×1
DRSG ADAPTIC 3X8 NADH LF (GAUZE/BANDAGES/DRESSINGS) IMPLANT
DRSG PAD ABDOMINAL 8X10 ST (GAUZE/BANDAGES/DRESSINGS) IMPLANT
DURAPREP 26ML APPLICATOR (WOUND CARE) ×2 IMPLANT
ELECT REM PT RETURN 9FT ADLT (ELECTROSURGICAL) ×2
ELECTRODE REM PT RTRN 9FT ADLT (ELECTROSURGICAL) ×1 IMPLANT
GLOVE BIO SURGEON STRL SZ7 (GLOVE) ×4 IMPLANT
GLOVE BIO SURGEON STRL SZ7.5 (GLOVE) ×2 IMPLANT
GLOVE BIO SURGEON STRL SZ8 (GLOVE) ×2 IMPLANT
GLOVE BIOGEL PI IND STRL 7.0 (GLOVE) ×1 IMPLANT
GLOVE BIOGEL PI IND STRL 7.5 (GLOVE) ×1 IMPLANT
GLOVE BIOGEL PI INDICATOR 7.0 (GLOVE) ×1
GLOVE BIOGEL PI INDICATOR 7.5 (GLOVE) ×1
GLOVE ECLIPSE 7.0 STRL STRAW (GLOVE) ×2 IMPLANT
GLOVE EUDERMIC 7 POWDERFREE (GLOVE) IMPLANT
GLOVE SS BIOGEL STRL SZ 7.5 (GLOVE) IMPLANT
GLOVE SUPERSENSE BIOGEL SZ 7.5 (GLOVE)
GOWN STRL NON-REIN LRG LVL3 (GOWN DISPOSABLE) ×2 IMPLANT
GOWN STRL REIN XL XLG (GOWN DISPOSABLE) ×6 IMPLANT
GUIDEPIN 3.2  ENDO CALB STRL (PIN) ×1
GUIDEPIN 3.2 ENDO CALB STRL (PIN) ×1 IMPLANT
K-WIRE ACE 1.6X6 (WIRE) ×6
KIT BASIN OR (CUSTOM PROCEDURE TRAY) ×2 IMPLANT
KIT ROOM TURNOVER OR (KITS) ×2 IMPLANT
KWIRE ACE 1.6X6 (WIRE) ×3 IMPLANT
MANIFOLD NEPTUNE II (INSTRUMENTS) ×2 IMPLANT
NEEDLE 22X1 1/2 (OR ONLY) (NEEDLE) ×2 IMPLANT
NS IRRIG 1000ML POUR BTL (IV SOLUTION) ×2 IMPLANT
PACK ORTHO EXTREMITY (CUSTOM PROCEDURE TRAY) ×2 IMPLANT
PAD ARMBOARD 7.5X6 YLW CONV (MISCELLANEOUS) ×4 IMPLANT
PAD CAST 4YDX4 CTTN HI CHSV (CAST SUPPLIES) IMPLANT
PADDING CAST COTTON 4X4 STRL (CAST SUPPLIES)
PADDING CAST COTTON 6X4 STRL (CAST SUPPLIES) IMPLANT
PLATE FEMORAL LOCK 6 HOLE LT (Plate) ×2 IMPLANT
SCREW CORT 4.5X48 (Screw) ×2 IMPLANT
SCREW LOCK DIST FEM 5.5X70 (Screw) ×2 IMPLANT
SCREW LOCK DIST FEM 5.5X80 (Screw) ×2 IMPLANT
SCREW LOCK PLY PROX TIB 8X75 (Screw) ×2 IMPLANT
SCREW NLOCK CORT 4.5X40 (Screw) ×2 IMPLANT
SCREW NLOCK CORT STAR 4.5X38 (Screw) ×4 IMPLANT
SCREW NLOCK CORT STAR 4.5X46 (Screw) ×2 IMPLANT
SCREW NLOCK DIST FEM 5.5X50 (Screw) ×2 IMPLANT
SCREW NLOCK DIST FEM 5.5X70 (Screw) ×2 IMPLANT
SCREW NLOCK DIST FEM 5.5X75 (Screw) ×2 IMPLANT
SCREW NLOCK DIST FEM 5.5X80 (Screw) ×2 IMPLANT
SCREW NLOCK DIST FEM 5.5X85 (Screw) ×2 IMPLANT
SPONGE GAUZE 4X4 12PLY (GAUZE/BANDAGES/DRESSINGS) ×2 IMPLANT
SPONGE LAP 18X18 X RAY DECT (DISPOSABLE) ×2 IMPLANT
STAPLER VISISTAT 35W (STAPLE) ×2 IMPLANT
STOCKINETTE IMPERVIOUS LG (DRAPES) ×2 IMPLANT
STRIP CLOSURE SKIN 1/2X4 (GAUZE/BANDAGES/DRESSINGS) IMPLANT
SUCTION FRAZIER TIP 10 FR DISP (SUCTIONS) ×2 IMPLANT
SUT MNCRL AB 3-0 PS2 18 (SUTURE) ×4 IMPLANT
SUT VIC AB 0 CT1 27 (SUTURE) ×2
SUT VIC AB 0 CT1 27XBRD ANBCTR (SUTURE) ×2 IMPLANT
SUT VIC AB 1 CT1 27 (SUTURE) ×2
SUT VIC AB 1 CT1 27XBRD ANBCTR (SUTURE) ×2 IMPLANT
SUT VIC AB 2-0 CT1 27 (SUTURE) ×2
SUT VIC AB 2-0 CT1 TAPERPNT 27 (SUTURE) ×2 IMPLANT
SYR 20ML ECCENTRIC (SYRINGE) ×2 IMPLANT
TOWEL OR 17X24 6PK STRL BLUE (TOWEL DISPOSABLE) ×2 IMPLANT
TOWEL OR 17X26 10 PK STRL BLUE (TOWEL DISPOSABLE) ×2 IMPLANT
TUBE CONNECTING 12X1/4 (SUCTIONS) ×2 IMPLANT
WATER STERILE IRR 1000ML POUR (IV SOLUTION) IMPLANT
YANKAUER SUCT BULB TIP NO VENT (SUCTIONS) ×2 IMPLANT

## 2013-04-11 NOTE — Progress Notes (Signed)
UR COMPLETED  

## 2013-04-11 NOTE — Progress Notes (Signed)
DR.SMITH AT BEDSIDE. AWARE OF LOW BP. NO NEW ORDERS AT THIS TIME

## 2013-04-11 NOTE — Progress Notes (Signed)
Orthopedic Tech Progress Note Patient Details:  Alyssa Knight April 18, 1917 [id number]  Ortho Devices Type of Ortho Device: Knee Immobilizer Ortho Device/Splint Location: left knee Ortho Device/Splint Interventions: Application Viewed order from rn order list  Nikki Dom 04/11/2013, 4:24 PM

## 2013-04-11 NOTE — Care Management Note (Signed)
CARE MANAGEMENT NOTE 04/11/2013  Patient:  Alyssa Knight, Alyssa Knight   Account Number:  [id number]  Date Initiated:  04/11/2013  Documentation initiated by:  Vance Peper  Subjective/Objective Assessment:   23 yr olf old female s/p fall, going to surgery today for Left femur ORIF.     Action/Plan:   CM spoke with patient concerning needs  at dischagre. CM will speak with family later today.   Anticipated DC Date:  04/14/2013   Anticipated DC Plan:           Choice offered to / List presented to:             Status of service:  In process, will continue to follow Medicare Important Message given?   (If response is "NO", the following Medicare IM given date fields will be blank) Date Medicare IM given:   Date Additional Medicare IM given:    Discharge Disposition:    Per UR Regulation:    If discussed at Long Length of Stay Meetings, dates discussed:    Comments:

## 2013-04-11 NOTE — Progress Notes (Signed)
Pt intubated by PACU MD Dr. Katrinka Blazing placed on above settings by Dr Katrinka Blazing verbal order at bed side.

## 2013-04-11 NOTE — Progress Notes (Signed)
[facility] PROGRESS NOTE  Alyssa Knight YNW:[id number] DOB: 19-Sep-1917 DOA: 04/10/2013 PCP: No primary provider on file.  Assessment/Plan: Principal Problem:   Femur fracture, left Active Problems:   HTN (hypertension)   Congestive heart failure   Chronic renal insufficiency, stage III (moderate)   Diabetes type 2, controlled    Left lateral femoral condyle fracture displaced  79 year old female with past medical history as noted above who fell after she tripped in the bathroom and broke her left femur. Patient was seen by Dr. Rennis Chris plan on doing the surgery.  Based on the revised cardiovascular risk index:intermediate. Risk  HTN CONTINUE isosorbide hydralazine(BiDil) for blood pressure control   Code Status: full Family Communication: family updated about patient's clinical progress Disposition Plan:  As above    Brief narrative: 79 year old female with PMH most significant for HTN, DM type 2, chronic kidney disease and chronic systolic congestive heart failure who tripped while changing her clothes and felt on her left side. She immediately started having left leg pain and swelling in left knee.  She was brought in the ER by her relatives and was diagnosed to have a lower femur fracture. Patient complains of pain only when she moves her left leg.  Denies chest pain, shortness of breath, constipation, change in urinary habits. She is able to walk a block before she gets short of breath. She lives with her daughter who works as a Chiropodist in [employer].    Consultants: Orthopedics   Procedures:  none  Antibiotics: none  HPI/Subjective: Left knee pain   Objective: Filed Vitals:   04/11/13 0000 04/11/13 0549 04/11/13 0640 04/11/13 0800  BP:   116/50   Pulse:   85   Temp:   97.8 F (36.6 C)   TempSrc:      Resp: 18  18 16   Height:      Weight:      SpO2: 99% 95% 96% 92%    Intake/Output Summary (Last 24 hours) at 04/11/13 1123 Last data filed  at 04/11/13 0641  Gross per 24 hour  Intake  487.5 ml  Output    500 ml  Net  -12.5 ml    Exam:  HENT:  Head: Atraumatic.  Nose: Nose normal.  Mouth/Throat: Oropharynx is clear and moist.  Eyes: Conjunctivae are normal. Pupils are equal, round, and reactive to light. No scleral icterus.  Neck: Neck supple. No tracheal deviation present.  Cardiovascular: Normal rate, regular rhythm, normal heart sounds and intact distal pulses.  Pulmonary/Chest: Effort normal and breath sounds normal. No respiratory distress.  Abdominal: Soft. Normal appearance and bowel sounds are normal. She exhibits no distension. There is no tenderness.  Musculoskeletal: She exhibits no edema and no tenderness.  Neurological: She is alert. No cranial nerve deficit.    Data Reviewed: Basic Metabolic Panel:  Recent Labs Lab 04/10/13 1841 04/11/13 0455  NA 137 138  K 4.3 4.0  CL 101 104  CO2 28 28  GLUCOSE 166* 138*  BUN 28* 27*  CREATININE 1.49* 1.52*  CALCIUM 8.9 8.3*    Liver Function Tests: No results found for this basename: AST, ALT, ALKPHOS, BILITOT, PROT, ALBUMIN,  in the last 168 hours No results found for this basename: LIPASE, AMYLASE,  in the last 168 hours No results found for this basename: AMMONIA,  in the last 168 hours  CBC:  Recent Labs Lab 04/10/13 1841 04/11/13 0455  WBC 10.4 7.2  NEUTROABS 8.7*  --   HGB  10.4* 8.5*  HCT 31.8* 26.2*  MCV 85.5 85.3  PLT 205 183    Cardiac Enzymes: No results found for this basename: CKTOTAL, CKMB, CKMBINDEX, TROPONINI,  in the last 168 hours BNP (last 3 results) No results found for this basename: PROBNP,  in the last 8760 hours   CBG:  Recent Labs Lab 04/11/13 0012 04/11/13 0437 04/11/13 1014  GLUCAP 148* 137* 85    Recent Results (from the past 240 hour(s))  SURGICAL PCR SCREEN     Status: None   Collection Time    04/11/13  7:50 AM      Result Value Range Status   MRSA, PCR NEGATIVE  NEGATIVE Final   Staphylococcus  aureus NEGATIVE  NEGATIVE Final   Comment:            The Xpert SA Assay (FDA     approved for NASAL specimens     in patients over 40 years of age),     is one component of     a comprehensive surveillance     program.  Test performance has     been validated by [employer] for patients greater     than or equal to 63 year old.     It is not intended     to diagnose infection nor to     guide or monitor treatment.     Studies: Dg Chest Portable 1 View  04/11/2013   *RADIOLOGY REPORT*  Clinical Data: Left femur fracture.  PORTABLE CHEST - 1 VIEW  Comparison: Chest x-ray 11/03/2008.  Findings: Dense opacification at the base of the left hemithorax may reflect areas of atelectasis and/or consolidation, with superimposed moderate left-sided pleural effusion.  Extensive calcifications are again seen projecting over the right hemithorax. Right apical pleuroparenchymal thickening.  Mild pulmonary venous congestion, without frank pulmonary edema.  Heart size appears borderline enlarged. The patient is rotated to the right on today's exam, resulting in distortion of the mediastinal contours and reduced diagnostic sensitivity and specificity for mediastinal pathology.  Atherosclerosis in the thoracic aorta.  IMPRESSION: 1.  New atelectasis and/or consolidation in the left lower lobe with moderate left-sided pleural effusion. 2.  Extensive pleural and parenchymal calcifications within the right hemithorax redemonstrated, potentially related to prior tuberculosis infection. 3.  Atherosclerosis.   Original Report Authenticated By: Trudie Reed, M.D.   Dg Knee Complete 4 Views Left  04/10/2013   *RADIOLOGY REPORT*  Clinical Data: Pain post fall  LEFT KNEE - COMPLETE 4+ VIEW  Comparison: None  Findings: Four views of the left knee submitted.  There is displaced fracture of distal left femur lateral femoral condyle. Moderate joint effusion.  Spurring of patella.  IMPRESSION: Displaced fracture of distal  left femur lateral femoral condyle.   Original Report Authenticated By: Natasha Mead, M.D.    Scheduled Meds: . chlorhexidine  60 mL Topical Once  . enoxaparin (LOVENOX) injection  30 mg Subcutaneous QHS  . insulin aspart  0-9 Units Subcutaneous Q4H  . isosorbide-hydrALAZINE  1 tablet Oral BID   Continuous Infusions:   Principal Problem:   Femur fracture, left Active Problems:   HTN (hypertension)   Congestive heart failure   Chronic renal insufficiency, stage III (moderate)   Diabetes type 2, controlled    Time spent: 40 minutes   [facility]  [facility] Pager [phone]. If 8PM-8AM, please contact night-coverage at [email or web address], password [facility] 04/11/2013, 11:23 AM  LOS: 1 day

## 2013-04-11 NOTE — Transfer of Care (Signed)
Immediate Anesthesia Transfer of Care Note  Patient: Alyssa Knight  Procedure(s) Performed: Procedure(s): OPEN REDUCTION INTERNAL FIXATION (ORIF) DISTAL FEMUR FRACTURE- left (Left)  Patient Location: PACU  Anesthesia Type:General  Level of Consciousness: responds to stimulation  Airway & Oxygen Therapy: Patient Spontanous Breathing and Patient connected to face mask oxygen  Post-op Assessment: Report given to PACU RN, Post -op Vital signs reviewed and stable and Patient moving all extremities  Post vital signs: Reviewed and stable  Complications: No apparent anesthesia complications

## 2013-04-11 NOTE — Preoperative (Signed)
Beta Blockers   Reason not to administer Beta Blockers:Not Applicable 

## 2013-04-11 NOTE — Anesthesia Procedure Notes (Signed)
Procedure Name: Intubation Date/Time: 04/11/2013 1:08 PM Performed by: Jerilee Hoh Pre-anesthesia Checklist: Emergency Drugs available, Patient identified, Suction available and Patient being monitored Patient Re-evaluated:Patient Re-evaluated prior to inductionOxygen Delivery Method: Circle system utilized Preoxygenation: Pre-oxygenation with 100% oxygen Intubation Type: IV induction Ventilation: Mask ventilation without difficulty and Oral airway inserted - appropriate to patient size Laryngoscope Size: Mac and 3 Grade View: Grade II Tube type: Oral Tube size: 7.0 mm Number of attempts: 1 Airway Equipment and Method: Stylet Placement Confirmation: ETT inserted through vocal cords under direct vision,  positive ETCO2 and breath sounds checked- equal and bilateral Secured at: 21 cm Tube secured with: Tape Dental Injury: Teeth and Oropharynx as per pre-operative assessment

## 2013-04-11 NOTE — Progress Notes (Signed)
INITIAL NUTRITION ASSESSMENT  DOCUMENTATION CODES Per approved criteria  -Not applicable   INTERVENTION: 1.  Modify diet; resume of diet as medically appropriate 2.  Supplements; consider Ensure daily once diet advanced  NUTRITION DIAGNOSIS: Increased nutrient needs related to healing as evidenced by fracture.   Monitor:  1.  Food/Beverage; resume of PO diet once appropriate  Reason for Assessment: Consult; assessment  79 y.o. female  Admitting Dx: Femur fracture, left  ASSESSMENT: Pt admitted with femur fx s/p fall.  Pt not available at time of visit- in OR.  No family in room. No available nutrition-related hx to review. RD to continue to follow and with pt for appropriate interventions as needed.       Height: Ht Readings from Last 1 Encounters:  04/10/13 5\' 7"  (1.702 m)    Weight: Wt Readings from Last 1 Encounters:  04/10/13 146 lb (66.225 kg)    Ideal Body Weight: 135 lbs  % Ideal Body Weight: 107%  Wt Readings from Last 10 Encounters:  04/10/13 146 lb (66.225 kg)  04/10/13 146 lb (66.225 kg)    Usual Body Weight: unknown  % Usual Body Weight: unknown  BMI:  Body mass index is 22.86 kg/(m^2).  Estimated Nutritional Needs: Kcal: 1650-1860 Protein: 68-79g Fluid: >1.8 L/day  Skin: wound to toenail  Diet Order: NPO  EDUCATION NEEDS: -Education not appropriate at this time   Intake/Output Summary (Last 24 hours) at 04/11/13 1344 Last data filed at 04/11/13 0641  Gross per 24 hour  Intake  487.5 ml  Output    500 ml  Net  -12.5 ml    Last BM: 6/14   Labs:   Recent Labs Lab 04/10/13 1841 04/11/13 0455  NA 137 138  K 4.3 4.0  CL 101 104  CO2 28 28  BUN 28* 27*  CREATININE 1.49* 1.52*  CALCIUM 8.9 8.3*  GLUCOSE 166* 138*    CBG (last 3)   Recent Labs  04/11/13 0437 04/11/13 1014 04/11/13 1237  GLUCAP 137* 85 88    Scheduled Meds: . enoxaparin (LOVENOX) injection  30 mg Subcutaneous QHS  . insulin aspart  0-9 Units  Subcutaneous Q4H  . isosorbide-hydrALAZINE  1 tablet Oral BID    Continuous Infusions: . lactated ringers 50 mL/hr at 04/11/13 1203    Past Medical History  Diagnosis Date  . Diabetes mellitus without complication     History reviewed. No pertinent past surgical history.  Loyce Dys, MS RD LDN Clinical Inpatient Dietitian Pager: [phone] Weekend/After hours pager: [phone]

## 2013-04-11 NOTE — Op Note (Signed)
04/10/2013 - 04/11/2013  3:05 PM  PATIENT:   Alyssa Knight  79 y.o. female  PRE-OPERATIVE DIAGNOSIS:  Left Distal Femur Fracture  POST-OPERATIVE DIAGNOSIS:  Displaced left distal femur lateral condyle fracture  PROCEDURE:  ORIF  SURGEON:  Aldrich Lloyd, Vania Rea M.D.  ASSISTANTS: [place] RNFA   ANESTHESIA:   GET  EBL: 300  SPECIMEN:  none  Drains: none   PATIENT DISPOSITION:  PACU - hemodynamically stable.    PLAN OF CARE: Admit to inpatient   Dictation# [id number]

## 2013-04-11 NOTE — Anesthesia Postprocedure Evaluation (Signed)
  Anesthesia Post-op Note  Patient: Alyssa Knight  Procedure(s) Performed: Procedure(s): OPEN REDUCTION INTERNAL FIXATION (ORIF) DISTAL FEMUR FRACTURE- left (Left)  Patient Location: PACU  Anesthesia Type:General  Level of Consciousness: sedated, unresponsive, pateint uncooperative, confused and lethargic  Airway and Oxygen Therapy: Patient re-intubated  Post-op Pain: none  Post-op Assessment: Post-op Vital signs reviewed, Patient's Cardiovascular Status Stable, PATIENT'S CARDIOVASCULAR STATUS UNSTABLE, RESPIRATORY FUNCTION UNSTABLE, Patent Airway, No signs of Nausea or vomiting and Pain level controlled  Post-op Vital Signs: stable  Complications: Patient re-intubated  Secondary to hypoventilation w/ CO2 increase, pt reintubated in PACU. Pt response to ventilation will determine whether we extucate prior to d/c from PACU.  GES

## 2013-04-11 NOTE — Anesthesia Preprocedure Evaluation (Addendum)
Anesthesia Evaluation  Patient identified by MRN, date of birth, ID band Patient awake    Reviewed: Allergy & Precautions, H&P , NPO status , Patient's Chart, lab work & pertinent test results  Airway Mallampati: II      Dental   Pulmonary shortness of breath and with exertion,  breath sounds clear to auscultation        Cardiovascular hypertension, Pt. on medications +CHF Rhythm:Regular Rate:Normal     Neuro/Psych    GI/Hepatic negative GI ROS, Neg liver ROS,   Endo/Other  diabetes  Renal/GU Renal disease     Musculoskeletal   Abdominal   Peds  Hematology   Anesthesia Other Findings   Reproductive/Obstetrics                           Anesthesia Physical Anesthesia Plan  ASA: III  Anesthesia Plan: General   Post-op Pain Management:    Induction: Intravenous  Airway Management Planned: Oral ETT  Additional Equipment:   Intra-op Plan:   Post-operative Plan: Possible Post-op intubation/ventilation  Informed Consent: I have reviewed the patients History and Physical, chart, labs and discussed the procedure including the risks, benefits and alternatives for the proposed anesthesia with the patient or authorized representative who has indicated his/her understanding and acceptance.   Dental advisory given  Plan Discussed with: CRNA, Anesthesiologist and Surgeon  Anesthesia Plan Comments:         Anesthesia Quick Evaluation

## 2013-04-11 NOTE — Progress Notes (Signed)
DR.SMITH CALLED TO BEDSIDE. PT LUNGS SOUND TIGHT WITH EXPIRATORY WHEEZING. DR.SMITH BAGGING PT. GOING TO REINTUBATE.

## 2013-04-11 NOTE — Progress Notes (Signed)
eLink Physician-Brief Progress Note Patient Name: Alyssa Knight DOB: [birth year] MRN: [id number]  Date of Service  04/11/2013   HPI/Events of Note  Pain, vent dysxchroiny   eICU Interventions  fent Avoid benzo   Intervention Category Minor Interventions: Agitation / anxiety - evaluation and management  Nelda Bucks. 04/11/2013, 7:59 PM

## 2013-04-11 NOTE — Progress Notes (Signed)
PT REINTUBATED BY DR.SMITH.

## 2013-04-11 NOTE — Consult Note (Signed)
PULMONARY  / CRITICAL CARE MEDICINE  Name: Alyssa Knight MRN: [id number] DOB: 26-Jan-1917    ADMISSION DATE:  04/10/2013 CONSULTATION DATE:  04/11/13  REFERRING MD :  Dr. Rennis Chris PRIMARY SERVICE: [facility]-->PCCM  CHIEF COMPLAINT:  Post-Op Respiratory Failure  BRIEF PATIENT DESCRIPTION: 79 y/o F admitted on 6/15 who had mechanical fall with L displaced fracture of distal left femur lateral femoral condyle  SIGNIFICANT EVENTS / STUDIES:  6/15 - admit s/p mechanical fall with L distal femur fx  LINES / TUBES: OETT 6/16>>>  CULTURES:   ANTIBIOTICS: Ancef 6/16 (post-op)  HISTORY OF PRESENT ILLNESS:  79 y/o F with a PMH of HTN, DM, CKD & chronic systolic CHF who tripped at home while changing clothes and fell on her left side.  She had subsequent pain & swelling in L knee and was brought to [facility] ER for further evaluation.  XRay demonstrated a LEFT displaced fracture of distal left femur lateral femoral condyle.   6/16 underwent ORIF of L femur fracture and was extubated post-op with progressive respiratory failure requiring re-intubation in the PACU.  PCCM consulted for ICU assistance.   PAST MEDICAL HISTORY :  Past Medical History  Diagnosis Date  . Diabetes mellitus without complication    History reviewed. No pertinent past surgical history. Prior to Admission medications   Medication Sig Start Date End Date Taking? Authorizing Provider  aspirin EC 81 MG tablet Take 81 mg by mouth daily.   Yes Historical Provider, MD  furosemide (LASIX) 40 MG tablet Take 40 mg by mouth daily.   Yes Historical Provider, MD  isosorbide-hydrALAZINE (BIDIL) 20-37.5 MG per tablet Take 1 tablet by mouth 2 (two) times daily.   Yes Historical Provider, MD  Multiple Vitamins-Minerals (CENTRUM SILVER PO) Take 1 tablet by mouth daily.   Yes Historical Provider, MD   Allergies  Allergen Reactions  . Actos (Pioglitazone) Other (See Comments)    May have dropped blood sugar too much-family unsure     FAMILY HISTORY:  No family history on file. SOCIAL HISTORY:  reports that she has never smoked. She does not have any smokeless tobacco history on file. She reports that  drinks alcohol. Her drug history is not on file.  REVIEW OF SYSTEMS:  Unable to complete as pt is intubated on vent.   SUBJECTIVE:   VITAL SIGNS: Temp:  [97.5 F (36.4 C)-98.6 F (37 C)] 97.5 F (36.4 C) (06/16 1525) Pulse Rate:  [85-113] 103 (06/16 1624) Resp:  [0-30] 12 (06/16 1640) BP: (70-163)/(40-102) 70/40 mmHg (06/16 1640) SpO2:  [90 %-100 %] 100 % (06/16 1624) FiO2 (%):  [28 %-50 %] 50 % (06/16 1649) Weight:  [146 lb (66.225 kg)] 146 lb (66.225 kg) (06/15 2205)  HEMODYNAMICS:    VENTILATOR SETTINGS: Vent Mode:  [-] PRVC FiO2 (%):  [28 %-50 %] 50 % Set Rate:  [12 bmp] 12 bmp Vt Set:  [12 mL] 12 mL PEEP:  [7 cmH20] 7 cmH20 Plateau Pressure:  [25 cmH20] 25 cmH20  INTAKE / OUTPUT: Intake/Output     06/15 0701 - 06/16 0700 06/16 0701 - 06/17 0700   P.O. 200    I.V. (mL/kg) 287.5 (4.3) 1000 (15.1)   Total Intake(mL/kg) 487.5 (7.4) 1000 (15.1)   Urine (mL/kg/hr) 500 100 (0.1)   Blood  100 (0.1)   Total Output 500 200   Net -12.5 +800          PHYSICAL EXAMINATION: General:  Frail elderly female on vent, NAD Neuro:  Awake, follows commands HEENT:  OETT, mm pink/moist, no JVD Cardiovascular:  s1s2 rrr, no m/r/g Lungs:  resp's even/non-labored, lungs bilaterally ess clear.  No crackles / rhonchi Abdomen:  Round/soft, bsx4 active Musculoskeletal:  No acute deformities, left leg with immobilizer Skin:  Warm/dry, LLE with great toe mild necrosis (known), slightly cool.  RLE warm.   LABS:  Recent Labs Lab 04/10/13 1841 04/11/13 0455  HGB 10.4* 8.5*  WBC 10.4 7.2  PLT 205 183  NA 137 138  K 4.3 4.0  CL 101 104  CO2 28 28  GLUCOSE 166* 138*  BUN 28* 27*  CREATININE 1.49* 1.52*  CALCIUM 8.9 8.3*  INR 1.08  --     Recent Labs Lab 04/11/13 0012 04/11/13 0437 04/11/13 1014  04/11/13 1237 04/11/13 1527  GLUCAP 148* 137* 85 88 93    CXR: 6/16 - ETT in good position, moderate L effusion, chronic R pleural plaques  ASSESSMENT / PLAN:  PULMONARY A: Post-Op Respiratory Failure - in setting of mild edema, sedation, L effusion L Pleural Effusion  Chronic R Pleural Plaques Pulmonary Edema - post op, Favor negative pressure edema but did have left effusion pre-op  P:   -full vent support overnight - 500 Vt/R12/Peep5 -lasix 20 mg (hold for SBP < 100) -f/u cxr in am -f/u abg in one hour to assess ventilation  -SBT in am 6/17 for potential extuabtion -empiric steroids given post-op resp failure requiring reintubation, caution / monitor for delirium in elderly -drop PEEP to 5, now that oxygenation has improved  CARDIOVASCULAR A:  Systolic CHF  HTN  EKG - flat T waves lat leads P:  -troponin x1 -hold Bidil post-op, consider restart in am 6/17  RENAL A:   CKD - baseline cr 1.4  P:   -monitor BMP -replace lytes as indicated with lasix  GASTROINTESTINAL A:    P:   -NPO, consider OGT & early feeds if not extubated in am 6/17 -bowel regimen with narcotics ->will need oral post extubation -protonix   HEMATOLOGIC A:   DVT Prophalyxis  P:  -lovenox / anticoagulation per Ortho  INFECTIOUS A:  Post- Op ORIF - no evidence of acute infectious process.   P:   -post op abx per Ortho -monitor fever curve / leukocytosis  ENDOCRINE A:     P:   -monitor CBG Q4 while NPO  NEUROLOGIC A:   Pain - post op ORIF L femur fx  P:   -PRN fentanyl for pain  -Robaxin for muscle spasms   Canary Brim, NP-C [facility] Pgr: [phone] or [phone]  Summary - negative pressure edema vs acute decompensation of systolic CHF due to fluid shifts of surgery, r/o fresh cardiac ischemia    I have personally obtained a history, examined the patient, evaluated laboratory and imaging results, formulated the assessment and plan and  placed orders.  CRITICAL CARE: The patient is critically ill with multiple organ systems failure and requires high complexity decision making for assessment and support, frequent evaluation and titration of therapies, application of advanced monitoring technologies and extensive interpretation of multiple databases. Critical Care Time devoted to patient care services described in this note is 60 minutes.   Oretha Milch  Pulmonary and Critical Care Medicine [facility] Pager: [phone]  04/11/2013, 5:10 PM

## 2013-04-11 NOTE — Progress Notes (Signed)
Recent events noted in PACU with progressive respiratory fatigue requiring reintubation. I have spoken with CCM who will evaluate, and anticipate transfer to ICU.

## 2013-04-12 ENCOUNTER — Inpatient Hospital Stay (HOSPITAL_COMMUNITY): Payer: Medicare Other

## 2013-04-12 ENCOUNTER — Encounter (HOSPITAL_COMMUNITY): Payer: Self-pay | Admitting: Orthopedic Surgery

## 2013-04-12 LAB — GLUCOSE, CAPILLARY
Glucose-Capillary: 116 mg/dL — ABNORMAL HIGH (ref 70–99)
Glucose-Capillary: 119 mg/dL — ABNORMAL HIGH (ref 70–99)

## 2013-04-12 LAB — BASIC METABOLIC PANEL
BUN: 27 mg/dL — ABNORMAL HIGH (ref 6–23)
BUN: 33 mg/dL — ABNORMAL HIGH (ref 6–23)
CO2: 28 mEq/L (ref 19–32)
Chloride: 101 mEq/L (ref 96–112)
Chloride: 101 mEq/L (ref 96–112)
Creatinine, Ser: 2.02 mg/dL — ABNORMAL HIGH (ref 0.50–1.10)
Glucose, Bld: 140 mg/dL — ABNORMAL HIGH (ref 70–99)
Glucose, Bld: 156 mg/dL — ABNORMAL HIGH (ref 70–99)
Potassium: 5.1 mEq/L (ref 3.5–5.1)
Potassium: 4.7 mEq/L (ref 3.5–5.1)

## 2013-04-12 LAB — POCT I-STAT 4, (NA,K, GLUC, HGB,HCT)
Glucose, Bld: 109 mg/dL — ABNORMAL HIGH (ref 70–99)
HCT: 26 % — ABNORMAL LOW (ref 36.0–46.0)
Hemoglobin: 8.8 g/dL — ABNORMAL LOW (ref 12.0–15.0)
Potassium: 4.3 mEq/L (ref 3.5–5.1)
Sodium: 139 mEq/L (ref 135–145)

## 2013-04-12 LAB — CBC
HCT: 24.2 % — ABNORMAL LOW (ref 36.0–46.0)
Hemoglobin: 7.9 g/dL — ABNORMAL LOW (ref 12.0–15.0)
WBC: 14.5 10*3/uL — ABNORMAL HIGH (ref 4.0–10.5)

## 2013-04-12 LAB — PRO B NATRIURETIC PEPTIDE: Pro B Natriuretic peptide (BNP): 10720 pg/mL — ABNORMAL HIGH (ref 0–450)

## 2013-04-12 NOTE — Progress Notes (Signed)
eLink Physician-Brief Progress Note Patient Name: Alyssa Knight DOB: [birth year] MRN: [id number]  Date of Service  04/12/2013   HPI/Events of Note   Concern of soft MAP and dec ur op post op. Creat up. BNP 10K. Known suystolic CHF  eICU Interventions  Cycle enzymes Check echo   Intervention Category Minor Interventions: Agitation / anxiety - evaluation and management  Clide Remmers 04/12/2013, 5:43 AM

## 2013-04-12 NOTE — Progress Notes (Signed)
Alyssa Knight  MRN: [id number] DOB/Age: 77-24-1918 77 y.o. Physician: Lynnea Maizes, M.D. 1 Day Post-Op Procedure(s) (LRB): OPEN REDUCTION INTERNAL FIXATION (ORIF) DISTAL FEMUR FRACTURE- left (Left)  Subjective: Extubated, resting comfortable, good spirits, family at bedside Vital Signs Temp:  [97.3 F (36.3 C)-99.5 F (37.5 C)] 99.3 F (37.4 C) (06/17 1138) Pulse Rate:  [44-114] 102 (06/17 1300) Resp:  [10-30] 23 (06/17 1300) BP: (70-150)/(34-102) 103/55 mmHg (06/17 1300) SpO2:  [90 %-100 %] 100 % (06/17 1300) FiO2 (%):  [28 %-50 %] 40 % (06/17 1100) Weight:  [68.8 kg (151 lb 10.8 oz)-71.4 kg (157 lb 6.5 oz)] 68.8 kg (151 lb 10.8 oz) (06/17 0500)  Lab Results  Recent Labs  04/11/13 1933 04/12/13 0320  WBC 13.2* 14.5*  HGB 8.3* 7.9*  HCT 25.9* 24.2*  PLT 171 171   BMET  Recent Labs  04/11/13 0455 04/11/13 1417 04/12/13 0320  NA 138 139 136  K 4.0 4.3 5.1  CL 104  --  101  CO2 28  --  26  GLUCOSE 138* 109* 140*  BUN 27*  --  27*  CREATININE 1.52*  --  1.78*  CALCIUM 8.3*  --  7.9*   INR  Date Value Range Status  04/10/2013 1.08  0.00 - 1.49 Final     Exam  Dressings dry, N/V intact. Compartments soft.  Plan Appreciate CCM excellent management. Orthopaedically ok to mobilize, TDWB LLE for transfers.OK for calf pumps and SLR's, gentle AA knee rom.  Makinley Muscato M 04/12/2013, 1:29 PM

## 2013-04-12 NOTE — Progress Notes (Signed)
Wasted of fentanyl with PharmD, Chelsea Aus. Did not give as pt was going to be extubated.

## 2013-04-12 NOTE — Clinical Social Work Note (Signed)
CSW noted referral from MD for SNF.  RNCM spoke with pt's daughter who is planning to have pt go home with her at discharge.  CSW will sign off.

## 2013-04-12 NOTE — Progress Notes (Signed)
Orthopedic Tech Progress Note Patient Details:  Alyssa Knight 22-Apr-1917 [id number] [facility] application order canceled for patient. Patient not appropriate for use.  Patient ID: Alyssa Knight, female   DOB: 1916/12/24, 79 y.o.   MRN: [id number]   Orie Rout 04/12/2013, 1:52 PM

## 2013-04-12 NOTE — Progress Notes (Signed)
PULMONARY  / CRITICAL CARE MEDICINE  Name: Alyssa Knight MRN: [id number] DOB: 11/09/1916    ADMISSION DATE:  04/10/2013 CONSULTATION DATE:  04/11/13  REFERRING MD :  Dr. Rennis Chris PRIMARY SERVICE: TRH-->PCCM  CHIEF COMPLAINT:  Post-Op Respiratory Failure  BRIEF PATIENT DESCRIPTION:  79 yo female had fall at home resulting in displaced Lt femur fracture.  Had ORIF 6/16.  Developed VDRF in PACU, and PCCM assumed care while in ICU.  SIGNIFICANT EVENTS / STUDIES:  6/15 - admit s/p mechanical fall with L distal femur fx 6/16 - ORIF; VDRF in PACU  LINES / TUBES: OETT 6/16>>>  CULTURES:   ANTIBIOTICS: Ancef 6/16 (post-op)  SUBJECTIVE:  Tolerating pressure support.  VITAL SIGNS: Temp:  [97.3 F (36.3 C)-99.5 F (37.5 C)] 98.9 F (37.2 C) (06/17 0747) Pulse Rate:  [44-114] 108 (06/17 1000) Resp:  [10-30] 26 (06/17 1000) BP: (70-150)/(34-102) 126/50 mmHg (06/17 1000) SpO2:  [90 %-100 %] 100 % (06/17 1000) FiO2 (%):  [28 %-50 %] 40 % (06/17 1000) Weight:  [151 lb 10.8 oz (68.8 kg)-157 lb 6.5 oz (71.4 kg)] 151 lb 10.8 oz (68.8 kg) (06/17 0500)  VENTILATOR SETTINGS: Vent Mode:  [-] CPAP;PSV FiO2 (%):  [28 %-50 %] 40 % Set Rate:  [12 bmp] 12 bmp Vt Set:  [12 mL-500 mL] 500 mL PEEP:  [5 cmH20-7 cmH20] 5 cmH20 Pressure Support:  [10 cmH20] 10 cmH20 Plateau Pressure:  [20 cmH20-26 cmH20] 26 cmH20  INTAKE / OUTPUT: Intake/Output     06/16 0701 - 06/17 0700 06/17 0701 - 06/18 0700   P.O.     I.V. (mL/kg) 1480 (21.5) 90 (1.3)   IV Piggyback 50    Total Intake(mL/kg) 1530 (22.2) 90 (1.3)   Urine (mL/kg/hr) 240 (0.1) 325 (1.2)   Blood 100 (0.1)    Total Output 340 325   Net +1190 -235          PHYSICAL EXAMINATION: General: No distress Neuro: Alert, follows commands, RASS 0 HEENT: ETT in place Cardiovascular: regular Lungs: scattered rhonchi Abdomen: soft, non tender Musculoskeletal: no edema Skin: LLE with great toe mild necrosis   LABS:  Recent Labs Lab  04/10/13 1841 04/11/13 0455 04/11/13 1417 04/11/13 1835 04/11/13 1920 04/11/13 1933 04/12/13 0320 04/12/13 0710  HGB 10.4* 8.5* 8.8*  --   --  8.3* 7.9*  --   WBC 10.4 7.2  --   --   --  13.2* 14.5*  --   PLT 205 183  --   --   --  171 171  --   NA 137 138 139  --   --   --  136  --   K 4.3 4.0 4.3  --   --   --  5.1  --   CL 101 104  --   --   --   --  101  --   CO2 28 28  --   --   --   --  26  --   GLUCOSE 166* 138* 109*  --   --   --  140*  --   BUN 28* 27*  --   --   --   --  27*  --   CREATININE 1.49* 1.52*  --   --   --   --  1.78*  --   CALCIUM 8.9 8.3*  --   --   --   --  7.9*  --   INR 1.08  --   --   --   --   --   --   --  TROPONINI  --   --   --  <0.30  --   --   --  <0.30  PROBNP  --   --   --   --   --   --  10720.0*  --   PHART  --   --   --   --  7.363  --   --   --   PCO2ART  --   --   --   --  44.5  --   --   --   PO2ART  --   --   --   --  148.0*  --   --   --     Recent Labs Lab 04/11/13 1726 04/11/13 1910 04/12/13 0023 04/12/13 0405 04/12/13 0713  GLUCAP 125* 107* 116* 119* 119*    Imaging: Dg Femur Left  04/11/2013   *RADIOLOGY REPORT*  Clinical Data: The distal left femur fracture.  LEFT FEMUR - 2 VIEW,DG C-ARM 1-60 MIN  Comparison: Radiographs dated 04/10/2013  Findings: AP and lateral views of the distal left femur demonstrate near anatomic alignment and position of the fracture after open reduction and internal fixation.  Side plate and multiple screws are in place.  C-arm imaging was provided during the operation.  Fluoroscopy time:  26 seconds.  IMPRESSION: Open reduction and internal fixation of the distal femur fracture.   Original Report Authenticated By: Francene Boyers, M.D.   Dg Chest Port 1 View  04/11/2013   *RADIOLOGY REPORT*  Clinical Data: Post intubation.  PORTABLE CHEST - 1 VIEW  Comparison: 04/10/2013 and 11/13/2008.  Findings: 1629 hours.  Interval intubation.  The tip of the endotracheal tube is in the mid trachea.  There are  worsening bilateral air space opacities superimposed on chronic pleural calcifications on the right.  A moderate sized left pleural effusion is similar to yesterday's examination.  No pneumothorax is identified.  The heart size and mediastinal contours are stable.  IMPRESSION:  1.  Satisfactory position of the endotracheal tube. 2.  Worsening bilateral air space opacities suspicious for edema or infection.  Similar left pleural effusion.   Original Report Authenticated By: Carey Bullocks, M.D.   Dg Chest Portable 1 View  04/11/2013   *RADIOLOGY REPORT*  Clinical Data: Left femur fracture.  PORTABLE CHEST - 1 VIEW  Comparison: Chest x-ray 11/03/2008.  Findings: Dense opacification at the base of the left hemithorax may reflect areas of atelectasis and/or consolidation, with superimposed moderate left-sided pleural effusion.  Extensive calcifications are again seen projecting over the right hemithorax. Right apical pleuroparenchymal thickening.  Mild pulmonary venous congestion, without frank pulmonary edema.  Heart size appears borderline enlarged. The patient is rotated to the right on today's exam, resulting in distortion of the mediastinal contours and reduced diagnostic sensitivity and specificity for mediastinal pathology.  Atherosclerosis in the thoracic aorta.  IMPRESSION: 1.  New atelectasis and/or consolidation in the left lower lobe with moderate left-sided pleural effusion. 2.  Extensive pleural and parenchymal calcifications within the right hemithorax redemonstrated, potentially related to prior tuberculosis infection. 3.  Atherosclerosis.   Original Report Authenticated By: Trudie Reed, M.D.   Dg Knee Complete 4 Views Left  04/10/2013   *RADIOLOGY REPORT*  Clinical Data: Pain post fall  LEFT KNEE - COMPLETE 4+ VIEW  Comparison: None  Findings: Four views of the left knee submitted.  There is displaced fracture of distal left femur lateral femoral condyle. Moderate joint effusion.  Spurring of  patella.  IMPRESSION: Displaced fracture  of distal left femur lateral femoral condyle.   Original Report Authenticated By: Natasha Mead, M.D.   Dg C-arm 1-60 Min  04/11/2013   *RADIOLOGY REPORT*  Clinical Data: The distal left femur fracture.  LEFT FEMUR - 2 VIEW,DG C-ARM 1-60 MIN  Comparison: Radiographs dated 04/10/2013  Findings: AP and lateral views of the distal left femur demonstrate near anatomic alignment and position of the fracture after open reduction and internal fixation.  Side plate and multiple screws are in place.  C-arm imaging was provided during the operation.  Fluoroscopy time:  26 seconds.  IMPRESSION: Open reduction and internal fixation of the distal femur fracture.   Original Report Authenticated By: Francene Boyers, M.D.     ASSESSMENT / PLAN:  PULMONARY A: Acute respiratory failure 2nd to pulmonary edema most likely from negative pressure pulmonary edema. Lt pleural effusion. Chronic R Pleural Plaques P:   -pressure support wean as tolerated >> hopefully extubate soon -continue solumedrol for now for ?upper airway edema  -goal negative fluid balance -may need thoracentesis to assess  Lt pleural effusion -f/u CXR  CARDIOVASCULAR A: Hx of systolic heart failure >> baseline EF 40 to 45% from 11/13/08; cardiac enzymes negative. Hx of HTN. P:  -f/u Echo -continue ASA -resume bidil when able to take oral medications  RENAL A: CKD - baseline cr 1.4 P:   -monitor renal fx, urine outpt, electrolytes -keep foley in for now  GASTROINTESTINAL A: Nutrition. P:   -will need tube feeds if unable to extubate soon -Protonix for SUP  HEMATOLOGIC A: Anemia. P:  -f/u CBC -transfuse for Hb < 7 -continue lovenox for DVT prevention  INFECTIOUS A: No evidence for infection. P:   -monitor clinically  ENDOCRINE A: No issues. P:   -monitor blood sugar on BMET  NEUROLOGIC A: Pain - post op ORIF L femur fx P:   -PRN fentanyl for pain  -Robaxin for muscle  spasms  CC time 35 minutes.  Coralyn Helling, MD [facility] Pulmonary/Critical Care 04/12/2013, 11:04 AM Pager:  [phone] After 3pm call: [phone]

## 2013-04-12 NOTE — Progress Notes (Signed)
eLink Physician-Brief Progress Note Patient Name: Alyssa Knight DOB: 10-22-1917 MRN: [id number]  Date of Service  04/12/2013   HPI/Events of Note   Nonsustained VT  eICU Interventions  Stat lytes   Intervention Category Major Interventions: Arrhythmia - evaluation and management  Nelda Bucks. 04/12/2013, 9:10 PM

## 2013-04-12 NOTE — Op Note (Signed)
Alyssa Knight, Alyssa Knight NO.:  [id number]  MEDICAL RECORD NO.:  [id number]  LOCATION:  2110                         FACILITY:  [facility]  PHYSICIAN:  Vania Rea. Kashia Brossard, M.D.  DATE OF BIRTH:  1917/03/07  DATE OF PROCEDURE:  04/11/2013 DATE OF DISCHARGE:                              OPERATIVE REPORT   PREOPERATIVE DIAGNOSIS:  Left distal femur displaced lateral femoral condylar fracture.  POSTOPERATIVE DIAGNOSIS:  Left distal femur displaced lateral femoral condylar fracture.  PROCEDURE:  Open reduction and internal fixation of displaced left distal femur lateral femoral condyle fracture.  SURGEON:  Vania Rea. Kaymarie Wynn, MD  ASSISTANT:  Patrick Jupiter, RNFA  ANESTHESIA:  General endotracheal.  ESTIMATED BLOOD LOSS:  300 mL.  DRAINS:  None.  TOURNIQUET:  None.  SPECIMENS:  None.  HISTORY:  Alyssa Knight is a 79 year old female who has been living at home with her son and daughter-in-law, independent ambulator, fell yesterday, injuring her left knee with __________ swelling and inability to bear weight.  On presentation to the emergency room, she was found to have some diffuse swelling and tenderness about the left knee with pain on attempts at motion.  X-rays were obtained, showing a displaced lateral femoral condyle fracture.  She was brought to the operating room at this time for planned open reduction and internal fixation.  Appropriately counseled Alyssa Knight regarding treatment options and risks versus benefits thereof.  Possible surgical complications were reviewed including potential for bleeding, infection, neurovascular injury, malunion, nonunion, loss of fixation, and possible need for additional surgery.  She understands and accepts and agrees with our planned procedure.  PROCEDURE IN DETAIL:  After undergoing routine preop evaluation, the patient received prophylactic antibiotics.  Brought to the operating room, placed supine on the operating room table,  underwent smooth induction of a general endotracheal anesthesia.  Foley catheter was placed.  Left lower extremity was then sterilely prepped and draped in standard fashion.  Time-out was called.  I made a direct lateral approach to the distal femur at the lateral midline incision from lateral femoral condyle, extending proximally for approximately 8 cm. Skin flaps were elevated.  Dissection carried deeply.  The iliotibial bands and the fascia were divided longitudinally along the course of the skin incision, elevated, and then dissection carried deeply, gaining access to the lateral aspect of the distal femur lateral femoral condyle.  Subperiosteal dissection was then used to expose the epicondylar ridge and this identified the fracture site which we exposed, irrigated, and removed all the interposed soft tissue, and then gained access to the knee joint where large hemarthrosis was evacuated. On inspection and palpation, I was able to realign the fracture fragment and then provisionally transfixed the fracture with 2 K-wires anteriorly.  We then used the 6-hole Biomet condylar plate and with the outrigger guide, we introduced this in a submuscular fashion proximally at the lateral aspect of the femoral shaft.  We then used fluoroscopic guidance to identify the proper positioning of the plate laterally and then placed an initial central guide pin transversely across the condyles.  Fluoroscopic imaging was then used to confirm that position was achieved appropriately with the overall alignment much to  our satisfaction.  I then placed initially several nonlocking screws all into the femoral condyles, although unfortunately, the bone quality was very poor.  We were able ultimately to get 2 nonlocking screws to allow compression along the lateral condyle fracture and placed our central lag screw and then additional locking screws for a total of 5 screws, traversing the condyles, obtaining  good purchase and excellent fixation. I also placed initially a proximal screw into the femoral shaft using the __________ guide and then once this was completed, the additional screw holes were filled with standard 3.5 nonlocking screws, obtaining excellent fixation along the femoral shaft.  Final fluoroscopic images were much to our satisfaction with good alignment of the fracture site, good position of the hardware.  The wound was then copiously irrigated. Hemostasis was obtained.  The incision was closed with interrupted figure-of-eight #1 Vicryl for the deep tissue layers, the tensor fascia, and then 2-0 Vicryl for the subcu, and intracuticular 3-0 Monocryl for the skin followed by Steri-Strips.  Dry dressing was applied.  Left leg was placed in knee immobilizer.  The patient was awakened, extubated, and taken to the recovery room in stable condition.     Vania Rea. Devin Ganaway, M.D.     KMS/MEDQ  D:  04/11/2013  T:  04/12/2013  Job:  [id number]

## 2013-04-12 NOTE — Progress Notes (Signed)
  Echocardiogram 2D Echocardiogram has been performed.  Georgian Co 04/12/2013, 3:16 PM

## 2013-04-12 NOTE — Progress Notes (Signed)
PT Cancellation Note  Patient Details Name: Alyssa Knight MRN: [id number] DOB: Sep 04, 1917   Cancelled Treatment:    Reason Eval/Treat Not Completed: Patient not medically ready (pt not yet 4 hrs post-extubation) Will evaluate mobility next date.   [name] 04/12/2013, 12:19 PM Delaney Meigs, PT [phone]

## 2013-04-12 NOTE — Progress Notes (Signed)
OT Cancellation Note  Patient Details Name: Alyssa Knight MRN: [id number] DOB: 1917-09-19   Cancelled Treatment:    Reason Eval/Treat Not Completed: Patient not medically ready  [facility] Savahna Casados, OTR/L  [phone] 04/12/2013 04/12/2013, 6:08 PM

## 2013-04-12 NOTE — Procedures (Signed)
Extubation Procedure Note  Patient Details:   Name: Alyssa Knight DOB: 01-15-1917 MRN: [id number]   Airway Documentation:     Evaluation  O2 sats: stable throughout Complications: No apparent complications Patient did tolerate procedure well. Bilateral Breath Sounds: Clear Suctioning: Airway Yes  Ave Filter 04/12/2013, 11:35 AM

## 2013-04-13 ENCOUNTER — Inpatient Hospital Stay (HOSPITAL_COMMUNITY): Payer: Medicare Other

## 2013-04-13 DIAGNOSIS — I10 Essential (primary) hypertension: Secondary | ICD-10-CM

## 2013-04-13 DIAGNOSIS — N289 Disorder of kidney and ureter, unspecified: Secondary | ICD-10-CM

## 2013-04-13 DIAGNOSIS — J90 Pleural effusion, not elsewhere classified: Secondary | ICD-10-CM | POA: Diagnosis not present

## 2013-04-13 DIAGNOSIS — E119 Type 2 diabetes mellitus without complications: Secondary | ICD-10-CM

## 2013-04-13 LAB — GLUCOSE, CAPILLARY
Glucose-Capillary: 147 mg/dL — ABNORMAL HIGH (ref 70–99)
Glucose-Capillary: 171 mg/dL — ABNORMAL HIGH (ref 70–99)
Glucose-Capillary: 195 mg/dL — ABNORMAL HIGH (ref 70–99)
Glucose-Capillary: 222 mg/dL — ABNORMAL HIGH (ref 70–99)

## 2013-04-13 LAB — URINALYSIS, ROUTINE W REFLEX MICROSCOPIC
Glucose, UA: NEGATIVE mg/dL
Protein, ur: 30 mg/dL — AB
Urobilinogen, UA: 0.2 mg/dL (ref 0.0–1.0)

## 2013-04-13 LAB — BODY FLUID CELL COUNT WITH DIFFERENTIAL
Eos, Fluid: 0 %
Neutrophil Count, Fluid: 57 % — ABNORMAL HIGH (ref 0–25)
Total Nucleated Cell Count, Fluid: 316 cu mm (ref 0–1000)

## 2013-04-13 LAB — URINE MICROSCOPIC-ADD ON

## 2013-04-13 LAB — CBC
HCT: 25 % — ABNORMAL LOW (ref 36.0–46.0)
Hemoglobin: 8 g/dL — ABNORMAL LOW (ref 12.0–15.0)
MCH: 27.3 pg (ref 26.0–34.0)
MCV: 85.3 fL (ref 78.0–100.0)
RBC: 2.93 MIL/uL — ABNORMAL LOW (ref 3.87–5.11)

## 2013-04-13 LAB — URINE CULTURE

## 2013-04-13 LAB — BASIC METABOLIC PANEL
CO2: 25 mEq/L (ref 19–32)
Calcium: 8.3 mg/dL — ABNORMAL LOW (ref 8.4–10.5)
Chloride: 99 mEq/L (ref 96–112)
Glucose, Bld: 183 mg/dL — ABNORMAL HIGH (ref 70–99)
Potassium: 4.9 mEq/L (ref 3.5–5.1)
Sodium: 134 mEq/L — ABNORMAL LOW (ref 135–145)

## 2013-04-13 LAB — PROTEIN, BODY FLUID

## 2013-04-13 MED ORDER — ENSURE COMPLETE PO LIQD
237.0000 mL | Freq: Two times a day (BID) | ORAL | Status: DC
  Administered 2013-04-14 – 2013-04-16 (×3): 237 mL via ORAL

## 2013-04-13 MED ORDER — SODIUM CHLORIDE 0.9 % IV SOLN
INTRAVENOUS | Status: DC
  Administered 2013-04-13: 14:00:00 via INTRAVENOUS

## 2013-04-13 MED ORDER — ISOSORB DINITRATE-HYDRALAZINE 20-37.5 MG PO TABS
1.0000 | ORAL_TABLET | Freq: Two times a day (BID) | ORAL | Status: DC
  Administered 2013-04-13 – 2013-04-16 (×7): 1 via ORAL
  Filled 2013-04-13 (×9): qty 1

## 2013-04-13 MED ORDER — HEPARIN SODIUM (PORCINE) 1000 UNIT/ML IJ SOLN: 4000.0000 [IU] | Freq: Once | INTRAMUSCULAR | Status: DC

## 2013-04-13 MED ORDER — PANTOPRAZOLE SODIUM 40 MG PO TBEC
40.0000 mg | DELAYED_RELEASE_TABLET | Freq: Every day | ORAL | Status: DC
  Administered 2013-04-13 – 2013-04-16 (×4): 40 mg via ORAL
  Filled 2013-04-13 (×4): qty 1

## 2013-04-13 NOTE — Procedures (Addendum)
Thoracentesis Procedure Note  Pre-operative Diagnosis: Left thoracentesis  Post-operative Diagnosis: same  Indications: Left pleural effusion  Procedure Details  Consent: Informed consent was obtained. Risks of the procedure were discussed including: infection, bleeding, pain, pneumothorax.  Under sterile conditions the patient was positioned. Betadine solution and sterile drapes were utilized.  1% plain lidocaine was used to anesthetize the 7 rib space. Fluid was obtained without any difficulties and minimal blood loss.  A dressing was applied to the wound and wound care instructions were provided.   Findings 1200 ml of clear, yellow pleural fluid was obtained.  Complications:  None; patient tolerated the procedure well.          Condition: stable  Plan F/u CXR pending. Will send fluid for glucose, protein, LDH, cell count, gram stain and culture, AFB, and cytology.  Attending Attestation: I was present for the entire procedure.  Coralyn Helling, MD [facility] Pulmonary/Critical Care 04/13/2013, 3:02 PM Pager:  [phone] After 3pm call: [phone]

## 2013-04-13 NOTE — Progress Notes (Signed)
PULMONARY  / CRITICAL CARE MEDICINE  Name: Alyssa Knight MRN: [id number] DOB: 10-20-1917    ADMISSION DATE:  04/10/2013 CONSULTATION DATE:  04/11/13  REFERRING MD :  Dr. Rennis Chris PRIMARY SERVICE: TRH-->PCCM  CHIEF COMPLAINT:  Post-Op Respiratory Failure  BRIEF PATIENT DESCRIPTION:  79 yo female had fall at home resulting in displaced Lt femur fracture.  Had ORIF 6/16.  Developed VDRF in PACU, and PCCM assumed care while in ICU.  SIGNIFICANT EVENTS / STUDIES:  6/15 - admit s/p mechanical fall with L distal femur fx 6/16 - ORIF; VDRF in PACU  LINES / TUBES: OETT 6/16>>>6/17  CULTURES:   ANTIBIOTICS: Ancef 6/16 (post-op)  SUBJECTIVE:  Feels better.  Denies chest pain.  VITAL SIGNS: Temp:  [97.6 F (36.4 C)-98.1 F (36.7 C)] 97.6 F (36.4 C) (06/18 0752) Pulse Rate:  [66-111] 70 (06/18 1100) Resp:  [18-28] 26 (06/18 1100) BP: (88-129)/(25-93) 94/69 mmHg (06/18 1100) SpO2:  [94 %-100 %] 100 % (06/18 1100)  INTAKE / OUTPUT: Intake/Output     06/17 0701 - 06/18 0700 06/18 0701 - 06/19 0700   P.O. 120 60   I.V. (mL/kg) 720 (10.5) 120 (1.7)   IV Piggyback     Total Intake(mL/kg) 840 (12.2) 180 (2.6)   Urine (mL/kg/hr) 905 (0.5) 150 (0.4)   Blood     Total Output 905 150   Net -65 +30          PHYSICAL EXAMINATION: General: No distress Neuro: Alert, follows commands HEENT: no sinus tenderness Cardiovascular: regular Lungs: decreased breath sounds Lt base Abdomen: soft, non tender Musculoskeletal: no edema Skin: LLE with great toe mild necrosis   LABS:  Recent Labs Lab 04/12/13 0320 04/12/13 2210 04/13/13 0440  NA 136 135 134*  K 5.1 4.7 4.9  CL 101 101 99  CO2 26 28 25   BUN 27* 33* 37*  CREATININE 1.78* 2.02* 2.14*  GLUCOSE 140* 156* 183*    Recent Labs Lab 04/11/13 1933 04/12/13 0320 04/13/13 0440  HGB 8.3* 7.9* 8.0*  HCT 25.9* 24.2* 25.0*  WBC 13.2* 14.5* 23.9*  PLT 171 171 193    Recent Labs Lab 04/12/13 1541 04/12/13 1952  04/13/13 0015 04/13/13 0348 04/13/13 0750  GLUCAP 133* 151* 147* 171* 114*    Imaging: Dg Femur Left  04/11/2013   *RADIOLOGY REPORT*  Clinical Data: The distal left femur fracture.  LEFT FEMUR - 2 VIEW,DG C-ARM 1-60 MIN  Comparison: Radiographs dated 04/10/2013  Findings: AP and lateral views of the distal left femur demonstrate near anatomic alignment and position of the fracture after open reduction and internal fixation.  Side plate and multiple screws are in place.  C-arm imaging was provided during the operation.  Fluoroscopy time:  26 seconds.  IMPRESSION: Open reduction and internal fixation of the distal femur fracture.   Original Report Authenticated By: Francene Boyers, M.D.   Dg Chest Port 1 View  04/13/2013   *RADIOLOGY REPORT*  Clinical Data: SOB, follow up pleural effusion, CHF  PORTABLE CHEST - 1 VIEW  Comparison: 04/12/2013  Findings: Interval extubation.  Chronic changes in the right hemithorax with dense pleural calcifications and volume loss.  Cardiomegaly with mild left perihilar edema, increased. Moderate left pleural effusion with associated left lower lobe opacity, possibly atelectasis, unchanged.  No pneumothorax.  IMPRESSION: Interval extubation.  Cardiomegaly with mild interstitial edema, increased.  Moderate left pleural effusion with associated left lower lobe opacity, possibly atelectasis, unchanged.   Original Report Authenticated By: Charline Bills, M.D.  Dg Chest Port 1 View  04/12/2013   *RADIOLOGY REPORT*  Clinical Data: CHF, pleural effusion  PORTABLE CHEST - 1 VIEW  Comparison: 04/11/2013  Findings: The cardiac shadow is stable.  An endotracheal tube remains in satisfactory position.  Diffuse pleural calcifications are noted on the right and stable.  A moderate-sized left-sided pleural effusion is again seen.  There is likely underlying left basilar infiltrate.  IMPRESSION: Chronic changes as described.  Persistent left pleural effusion and likely basilar  infiltrate.   Original Report Authenticated By: Alcide Clever, M.D.   Dg Chest Port 1 View  04/11/2013   *RADIOLOGY REPORT*  Clinical Data: Post intubation.  PORTABLE CHEST - 1 VIEW  Comparison: 04/10/2013 and 11/13/2008.  Findings: 1629 hours.  Interval intubation.  The tip of the endotracheal tube is in the mid trachea.  There are worsening bilateral air space opacities superimposed on chronic pleural calcifications on the right.  A moderate sized left pleural effusion is similar to yesterday's examination.  No pneumothorax is identified.  The heart size and mediastinal contours are stable.  IMPRESSION:  1.  Satisfactory position of the endotracheal tube. 2.  Worsening bilateral air space opacities suspicious for edema or infection.  Similar left pleural effusion.   Original Report Authenticated By: Carey Bullocks, M.D.   Dg C-arm 1-60 Min  04/11/2013   *RADIOLOGY REPORT*  Clinical Data: The distal left femur fracture.  LEFT FEMUR - 2 VIEW,DG C-ARM 1-60 MIN  Comparison: Radiographs dated 04/10/2013  Findings: AP and lateral views of the distal left femur demonstrate near anatomic alignment and position of the fracture after open reduction and internal fixation.  Side plate and multiple screws are in place.  C-arm imaging was provided during the operation.  Fluoroscopy time:  26 seconds.  IMPRESSION: Open reduction and internal fixation of the distal femur fracture.   Original Report Authenticated By: Francene Boyers, M.D.     ASSESSMENT / PLAN:  PULMONARY A: Acute respiratory failure 2nd to pulmonary edema most likely from negative pressure pulmonary edema >> resolved. Lt pleural effusion. Chronic R Pleural Plaques. P:   -oxygen as needed to keep SpO2 > 92% -family deciding about having thoracentesis >> I have recommended this -d/c solumedrol -taper steroids  CARDIOVASCULAR A: Hx of systolic heart failure >> baseline EF 40 to 45% from 11/13/08; cardiac enzymes negative. Hx of HTN. P:  -f/u  Echo -continue ASA -resume bidil 6/18  RENAL A: Acute on Chronic renal failure (BL scr 1.4). S cr rising.  P:   -monitor renal fx -send urine studies  -keep foley in for now -hold further diuresis for now  GASTROINTESTINAL A: Nutrition. P:   -Protonix for SUP -carb modified diet  HEMATOLOGIC A: Anemia. (ACD) P:  -f/u CBC -transfuse for Hb < 7 -continue lovenox for DVT prevention  INFECTIOUS A: No evidence for infection. P:   -monitor clinically  ENDOCRINE A: No issues. P:   -monitor blood sugar on BMET  NEUROLOGIC A: Pain - post op ORIF L femur fx P:   -PRN fentanyl for pain  -Robaxin for muscle spasms  Updated pt's family.  Will transfer to telemetry 6/18.  Will ask [facility] to resume care 6/19.  PCCM will f/u as pulmonary consult to assess pleural effusion.  Coralyn Helling, MD [facility] Pulmonary/Critical Care 04/13/2013, 12:38 PM Pager:  [phone] After 3pm call: [phone]

## 2013-04-13 NOTE — Progress Notes (Signed)
PT Cancellation Note  Patient Details Name: Alyssa Knight MRN: [id number] DOB: 09-16-1917   Cancelled Treatment:    Reason Eval/Treat Not Completed: Patient at procedure or test/unavailable;Patient not medically ready. Per nsg, pt s/p thoracentesis. Will follow up for eval tomorrow.   Fabio Asa 04/13/2013, 4:03 PM

## 2013-04-13 NOTE — Progress Notes (Signed)
Alyssa Knight  MRN: [id number] DOB/Age: Dec 25, 1916 79 y.o. Physician: Lynnea Maizes, M.D. 2 Days Post-Op Procedure(s) (LRB): OPEN REDUCTION INTERNAL FIXATION (ORIF) DISTAL FEMUR FRACTURE- left (Left)  Subjective: No new c/o. Anticipating thoracentesis this pm Vital Signs Temp:  [97.6 F (36.4 C)-98.1 F (36.7 C)] 97.6 F (36.4 C) (06/18 1213) Pulse Rate:  [66-111] 101 (06/18 1300) Resp:  [18-32] 32 (06/18 1300) BP: (88-129)/(25-93) 122/70 mmHg (06/18 1300) SpO2:  [94 %-100 %] 100 % (06/18 1300)  Lab Results  Recent Labs  04/12/13 0320 04/13/13 0440  WBC 14.5* 23.9*  HGB 7.9* 8.0*  HCT 24.2* 25.0*  PLT 171 193   BMET  Recent Labs  04/12/13 2210 04/13/13 0440  NA 135 134*  K 4.7 4.9  CL 101 99  CO2 28 25  GLUCOSE 156* 183*  BUN 33* 37*  CREATININE 2.02* 2.14*  CALCIUM 8.2* 8.3*   INR  Date Value Range Status  04/10/2013 1.08  0.00 - 1.49 Final     Exam  Able to lift left leg. Good ankle motion. Minimal leg pain  Plan Ortho plan as previously outlined. Anticipate 6 weeks NWB LLE, then 6 weeks PWB 50% LLE. OK for GENTLE AA rom left knee with PT  Alyssa Knight M 04/13/2013, 1:45 PM

## 2013-04-13 NOTE — Progress Notes (Signed)
Pt states she has not had a BM since last Friday 6/13. Pt given scheduled colace tonight and PRN dose of miralax. Baron Hamper, RN 04/13/2013

## 2013-04-13 NOTE — Progress Notes (Signed)
NUTRITION FOLLOW UP  Intervention:   1.  Supplements; Ensure Complete po BID, each supplement provides 350 kcal and 13 grams of protein. 2.  General healthful diet; encourage intake as able.   Nutrition Dx:   Increased nutrient needs related to healing as evidenced by fracture.   Monitor:  1. Food/Beverage; resume of PO diet once appropriate  Assessment:   Pt admitted with femur fx s/p fall. RD met with pt to discuss nutrition status PTA.  Pt reports she was eating well with good appetite PTA.  Pt believes she may have lost weight, but reports her usual wt as 150-160 lbs which is consistent with her current wt.  Pt is able to give a vague dietary recall which includes a light breakfast or morning snacks, lunch and dinner meals, and occasional snacks or Ensure.  Pt reports she received breakfast this morning and ate well. Discussed increased nutrient needs for healing with pt and encouraged intake.   Height: Ht Readings from Last 1 Encounters:  04/11/13 5\' 6"  (1.676 m)    Weight Status:   Wt Readings from Last 1 Encounters:  04/12/13 151 lb 10.8 oz (68.8 kg)    Re-estimated needs:  Kcal: 1720-1930 Protein: 75-90g Fluid: ~2.0 L/day  Skin: intact  Diet Order: Carb Control   Intake/Output Summary (Last 24 hours) at 04/13/13 1239 Last data filed at 04/13/13 1100  Gross per 24 hour  Intake    870 ml  Output    330 ml  Net    540 ml    Last BM: 6/14   Labs:   Recent Labs Lab 04/12/13 0320 04/12/13 2210 04/13/13 0440  NA 136 135 134*  K 5.1 4.7 4.9  CL 101 101 99  CO2 26 28 25   BUN 27* 33* 37*  CREATININE 1.78* 2.02* 2.14*  CALCIUM 7.9* 8.2* 8.3*  MG  --  2.1  --   PHOS  --  5.4*  --   GLUCOSE 140* 156* 183*    CBG (last 3)   Recent Labs  04/13/13 0348 04/13/13 0750 04/13/13 1210  GLUCAP 171* 114* 195*    Scheduled Meds: . aspirin EC  325 mg Oral Q breakfast  . docusate sodium  100 mg Oral BID  . enoxaparin (LOVENOX) injection  30 mg  Subcutaneous QHS  . insulin aspart  0-9 Units Subcutaneous Q4H  . methylPREDNISolone (SOLU-MEDROL) injection  40 mg Intravenous Q12H  . pantoprazole  40 mg Oral Q1200    Continuous Infusions: . dextrose 5 % and 0.45% NaCl 30 mL/hr at 04/13/13 0435    Loyce Dys, MS RD LDN Clinical Inpatient Dietitian Pager: [phone] Weekend/After hours pager: [phone]

## 2013-04-13 NOTE — Progress Notes (Signed)
OT Cancellation Note  Patient Details Name: Alyssa Knight MRN: [id number] DOB: February 11, 1917   Cancelled Treatment:    Reason Eval/Treat Not Completed: Medical issues which prohibited therapy (s/p thorencentesis. Bedrest)  Cyara Devoto,HILLARY 04/13/2013, 4:04 PM

## 2013-04-14 ENCOUNTER — Inpatient Hospital Stay (HOSPITAL_COMMUNITY): Payer: Medicare Other

## 2013-04-14 DIAGNOSIS — I509 Heart failure, unspecified: Secondary | ICD-10-CM

## 2013-04-14 LAB — URINE CULTURE: Colony Count: NO GROWTH

## 2013-04-14 LAB — TYPE AND SCREEN: Unit division: 0

## 2013-04-14 LAB — SODIUM, URINE, RANDOM: Sodium, Ur: 12 mEq/L

## 2013-04-14 LAB — URINALYSIS W MICROSCOPIC + REFLEX CULTURE
Nitrite: NEGATIVE
Protein, ur: NEGATIVE mg/dL
Specific Gravity, Urine: 1.019 (ref 1.005–1.030)
Urobilinogen, UA: 0.2 mg/dL (ref 0.0–1.0)

## 2013-04-14 LAB — POTASSIUM: Potassium: 4.4 mEq/L (ref 3.5–5.1)

## 2013-04-14 LAB — RETICULOCYTES
RBC.: 2.83 MIL/uL — ABNORMAL LOW (ref 3.87–5.11)
Retic Count, Absolute: 39.6 10*3/uL (ref 19.0–186.0)
Retic Ct Pct: 1.4 % (ref 0.4–3.1)

## 2013-04-14 LAB — BASIC METABOLIC PANEL
BUN: 48 mg/dL — ABNORMAL HIGH (ref 6–23)
Calcium: 8.1 mg/dL — ABNORMAL LOW (ref 8.4–10.5)
Creatinine, Ser: 2.24 mg/dL — ABNORMAL HIGH (ref 0.50–1.10)
GFR calc Af Amer: 20 mL/min — ABNORMAL LOW (ref >90)
GFR calc non Af Amer: 18 mL/min — ABNORMAL LOW (ref >90)

## 2013-04-14 LAB — CBC
HCT: 21.6 % — ABNORMAL LOW (ref 36.0–46.0)
MCH: 27.7 pg (ref 26.0–34.0)
MCHC: 32.4 g/dL (ref 30.0–36.0)
MCV: 85.4 fL (ref 78.0–100.0)
Platelets: 188 10*3/uL (ref 150–400)
RDW: 15.7 % — ABNORMAL HIGH (ref 11.5–15.5)
WBC: 21.5 10*3/uL — ABNORMAL HIGH (ref 4.0–10.5)

## 2013-04-14 LAB — CREATININE, URINE, RANDOM
Creatinine, Urine: 280.13 mg/dL
Creatinine, Urine: 183.86 mg/dL

## 2013-04-14 LAB — OSMOLALITY, URINE: Osmolality, Ur: 437 mOsm/kg (ref 390–1090)

## 2013-04-14 MED ORDER — SODIUM POLYSTYRENE SULFONATE 15 GM/60ML PO SUSP
30.0000 g | Freq: Once | ORAL | Status: AC
  Administered 2013-04-14: 30 g via ORAL
  Filled 2013-04-14: qty 120

## 2013-04-14 MED ORDER — SODIUM CHLORIDE 0.9 % IV SOLN
INTRAVENOUS | Status: AC
  Administered 2013-04-14: 20:00:00 via INTRAVENOUS

## 2013-04-14 NOTE — Progress Notes (Signed)
Pt continues to pass gas and had 1 bowel movement. Will continue to monitor. Baron Hamper, RN

## 2013-04-14 NOTE — Progress Notes (Signed)
Pharmacist Heart Failure Core Measure Documentation  Assessment: Alyssa Knight has an EF documented as 30%on 04/12/13 by echo.  Rationale: Heart failure patients with left ventricular systolic dysfunction (LVSD) and an EF < 40% should be prescribed an angiotensin converting enzyme inhibitor (ACEI) or angiotensin receptor blocker (ARB) at discharge unless a contraindication is documented in the medical record.  This patient is not currently on an ACEI or ARB for HF.  This note is being placed in the record in order to provide documentation that a contraindication to the use of these agents is present for this encounter.  ACE Inhibitor or Angiotensin Receptor Blocker is contraindicated (specify all that apply)  []   ACEI allergy AND ARB allergy []   Angioedema []   Moderate or severe aortic stenosis []   Hyperkalemia []   Hypotension []   Renal artery stenosis [x]   Worsening renal function, preexisting renal disease or dysfunction   Severiano Gilbert 04/14/2013 8:43 AM

## 2013-04-14 NOTE — Progress Notes (Signed)
Pt given kayexalate today for potassium of 5.3. Pt has not had BM yet. Pt given miralax in prune juice. Pt vomited prune juice. But is no longer nauseous. Pt has bowel sounds in all quadrants and abdomen is obese. Pt is passing gas. Will continue to monitor pt. Baron Hamper, RN 04/14/2013

## 2013-04-14 NOTE — Progress Notes (Signed)
Pt asymptomatic with 7 beats of svt. BP 110/60 manual. HR now 107, MD notified. Baron Hamper, RN

## 2013-04-14 NOTE — Evaluation (Signed)
Occupational Therapy Evaluation Patient Details Name: Alyssa Knight MRN: [id number] DOB: 02/20/1917 Today's Date: 04/14/2013 Time: 4098-1191 OT Time Calculation (min): 32 min  OT Assessment / Plan / Recommendation Clinical Impression  Pt admitted with fractured L distal femur sustained in a fall.  Pt underwent an ORIF and subsequently had VDRF.  Pt is required to maintain NWB on her L LE x 6 weeks.  Pt presents with generalized weakness, decreased activity tolerance, and requires mod assist for ambulation with R    OT Assessment  Patient needs continued OT Services    Follow Up Recommendations  Home health OT;Supervision/Assistance - 24 hour    Barriers to Discharge      Equipment Recommendations  3 in 1 bedside comode;Tub/shower bench;Wheelchair (measurements OT);Wheelchair cushion (measurements OT)    Recommendations for Other Services    Frequency  Min 2X/week    Precautions / Restrictions Precautions Precautions: Fall Precaution Comments: gentle AROM L knee Required Braces or Orthoses: Knee Immobilizer - Left Restrictions Weight Bearing Restrictions: Yes LLE Weight Bearing: Non weight bearing   Pertinent Vitals/Pain Moderate in L LE, premedicated,repositioned, did not want more meds    ADL  Eating/Feeding: Independent Where Assessed - Eating/Feeding: Chair Grooming: Teeth care;Wash/dry hands;Wash/dry face;Set up;Brushing hair Where Assessed - Grooming: Supported sitting Upper Body Bathing: Set up Where Assessed - Upper Body Bathing: Unsupported sitting Lower Body Bathing: Maximal assistance Where Assessed - Lower Body Bathing: Unsupported sitting;Supported sit to stand Upper Body Dressing: Set up Where Assessed - Upper Body Dressing: Unsupported sitting Lower Body Dressing: Maximal assistance Where Assessed - Lower Body Dressing: Unsupported sitting;Supported sit to stand Toilet Transfer: Moderate assistance Toilet Transfer Method: Sit to stand Equipment Used:  Knee Immobilizer;Rolling walker Transfers/Ambulation Related to ADLs: mod assist for ambulation with RW to maintain NWB status on L LE ADL Comments: Pt was very active PTA, wants to be able to perform ADL independently.    OT Diagnosis: Generalized weakness;Acute pain  OT Problem List: Decreased strength;Decreased activity tolerance;Impaired balance (sitting and/or standing);Decreased knowledge of use of DME or AE;Decreased knowledge of precautions;Pain OT Treatment Interventions: Self-care/ADL training;DME and/or AE instruction;Splinting;Patient/family education   OT Goals Acute Rehab OT Goals OT Goal Formulation: With patient Time For Goal Achievement: 04/21/13 Potential to Achieve Goals: Good ADL Goals Pt Will Perform Grooming: with supervision;Standing at sink ADL Goal: Grooming - Progress: Goal set today Pt Will Perform Lower Body Bathing: with supervision;Sit to stand from chair;with adaptive equipment ADL Goal: Lower Body Bathing - Progress: Goal set today Pt Will Perform Lower Body Dressing: with supervision;Sit to stand from chair;with adaptive equipment ADL Goal: Lower Body Dressing - Progress: Goal set today Pt Will Transfer to Toilet: with supervision;Ambulation;with DME;3-in-1;Maintaining weight bearing status ADL Goal: Toilet Transfer - Progress: Goal set today Pt Will Perform Toileting - Clothing Manipulation: with supervision;Standing ADL Goal: Toileting - Clothing Manipulation - Progress: Goal set today Pt Will Perform Toileting - Hygiene: with supervision;Sitting on 3-in-1 or toilet ADL Goal: Toileting - Hygiene - Progress: Goal set today Pt Will Perform Tub/Shower Transfer: Tub transfer;with supervision;Ambulation;with DME;Transfer tub bench;Maintaining weight bearing status ADL Goal: Tub/Shower Transfer - Progress: Goal set today  Visit Information  Last OT Received On: 04/14/13 Assistance Needed: +1 PT/OT Co-Evaluation/Treatment: Yes    Subjective Data   Subjective: "I was being smart washing clothes!" Patient Stated Goal: Home with family--to daughter's home.   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 swinging forw. on UE's) Left Upper Extremity Assessment LUE ROM/Strength/Tone: Within functional levels Right Lower Extremity Assessment RLE ROM/Strength/Tone: Within functional levels (bil generally weak at >=4/5) Left Lower Extremity Assessment LLE ROM/Strength/Tone: Deficits;Unable to fully assess;Due to pain (could maintain NWB in standing for short periods only)     Mobility Bed Mobility Bed Mobility: Supine to Sit;Sitting - Scoot to Edge of Bed Supine to Sit: 4: Min assist Sitting - Scoot to [place] of Bed: 5: Supervision Details for Bed Mobility Assistance: verbal cues to bridge hips Transfers Transfers: Sit to Stand;Stand to Sit Sit to Stand: 4: Min assist;With upper extremity assist;From bed Stand to Sit: 4: Min assist;With upper extremity assist;To chair/3-in-1 Details for Transfer Assistance: verbal cues  for hand placement     Exercise General Exercises - Lower Extremity Heel Slides: AAROM;Left;10 reps;Supine   Balance Balance Balance Assessed: Yes Static Sitting Balance Static Sitting - Balance Support: Feet supported Static Sitting - Level of Assistance: 5: Stand by assistance Static Sitting - Comment/# of Minutes: 5 Static Standing Balance Static Standing - Balance Support: Bilateral upper extremity supported Static Standing - Level of Assistance: 4: Min assist Static Standing - Comment/# of Minutes: 1   End of Session OT - End of Session Activity Tolerance: Patient limited by fatigue Patient left: in chair;with call bell/phone within reach  GO     Evern Bio 04/14/2013, 1:19 PM [phone]

## 2013-04-14 NOTE — Evaluation (Signed)
Physical Therapy Evaluation Patient Details Name: Alyssa Knight MRN: [id number] DOB: [birth year] Today's Date: 04/14/2013 Time: 3329-5188 PT Time Calculation (min): 30 min  PT Assessment / Plan / Recommendation Clinical Impression  pt admitted with L distal femur fx s/p ORIF b/c was trying to do laundry and fell while taking off her clothes.  On eval, pt moved surprisingly well, but has difficulty ambulating NWB on the L LE.  Pt will benefit from Pt to maximize Independence.    PT Assessment  Patient needs continued PT services    Follow Up Recommendations  Home health PT    Does the patient have the potential to tolerate intense rehabilitation      Barriers to Discharge        Equipment Recommendations  Rolling walker with 5" wheels;Other (comment);Wheelchair (measurements PT);Wheelchair cushion (measurements PT) (3 in 1, tub bench)    Recommendations for Other Services     Frequency Min 3X/week    Precautions / Restrictions Precautions Precautions: Fall Precaution Comments: gentle AROM L knee Required Braces or Orthoses: Knee Immobilizer - Left Restrictions Weight Bearing Restrictions: Yes LLE Weight Bearing: Non weight bearing   Pertinent Vitals/Pain       Mobility  Bed Mobility Bed Mobility: Supine to Sit;Sitting - Scoot to Edge of Bed Supine to Sit: 4: Min assist Sitting - Scoot to [place] of Bed: 5: Supervision Details for Bed Mobility Assistance: verbal cues to bridge hips Transfers Transfers: Sit to Stand;Stand to Sit Sit to Stand: 4: Min assist;With upper extremity assist;From bed Stand to Sit: 4: Min assist;With upper extremity assist;To chair/3-in-1 Details for Transfer Assistance: verbal cues for hand placement Ambulation/Gait Ambulation/Gait Assistance: 3: Mod assist Ambulation Distance (Feet): 5 Feet Assistive device: Rolling walker Ambulation/Gait Assistance Details: difficulty with "swing to " while maintaining NWB L LE, but stood and pivoted much  better. Gait Pattern: Step-to pattern Stairs: No Wheelchair Mobility Wheelchair Mobility: No    Exercises General Exercises - Lower Extremity Heel Slides: AAROM;Left;10 reps;Supine   PT Diagnosis: Difficulty walking;Generalized weakness;Acute pain  PT Problem List: Decreased strength;Decreased activity tolerance;Decreased range of motion;Decreased mobility;Decreased knowledge of use of DME;Decreased knowledge of precautions;Pain PT Treatment Interventions: Gait training;Functional mobility training;Therapeutic activities;Therapeutic exercise;Patient/family education;DME instruction;Stair training   PT Goals Acute Rehab PT Goals PT Goal Formulation: With patient Time For Goal Achievement: 04/28/13 Potential to Achieve Goals: Good Pt will go Supine/Side to Sit: with supervision PT Goal: Supine/Side to Sit - Progress: Goal set today Pt will go Sit to Stand: Other (comment) (min guard assist) PT Goal: Sit to Stand - Progress: Goal set today Pt will Transfer Bed to Chair/Chair to Bed: Other (comment) (min guard assist) PT Transfer Goal: Bed to Chair/Chair to Bed - Progress: Goal set today Pt will Ambulate: 1 - 15 feet;with min assist;with least restrictive assistive device;with rolling walker PT Goal: Ambulate - Progress: Goal set today Pt will Go Up / Down Stairs: 1-2 stairs;with +1 total assist;with other equipment (comment) (with w/c,  family will demo or verbalize understanding) PT Goal: Up/Down Stairs - Progress: Goal set today  Visit Information  Last PT Received On: 04/14/13 Assistance Needed: +1    Subjective Data  Subjective: Yeah, I do everything myself.Marland KitchenMarland KitchenI go volunteer at the food bank. Patient Stated Goal: Home and independent   Prior Functioning  Home Living Lives With: Alone Available Help at Discharge: Family;Available 24 hours/day (Will go to stay with family post D/C) Type of Home: House Home Access: Stairs to enter [employer] of Steps:  2 Home  Layout: One level Bathroom Shower/Tub: Advice worker with back Prior Function Level of Independence: Independent Able to Take Stairs?: Yes Driving: No Vocation:  (volunteers at food bank) Communication Communication: No difficulties    Cognition  Cognition Arousal/Alertness: Awake/alert Behavior During Therapy: WFL for tasks assessed/performed Overall Cognitive Status: Within Functional Limits for tasks assessed    Extremity/Trunk Assessment Right Upper Extremity Assessment RUE ROM/Strength/Tone: Within functional levels (functional, weak enough to have diff. swinging forw. on UE's) Left Upper Extremity Assessment LUE ROM/Strength/Tone: Within functional levels Right Lower Extremity Assessment RLE ROM/Strength/Tone: Within functional levels (bil generally weak at >=4/5) Left Lower Extremity Assessment LLE ROM/Strength/Tone: Deficits;Unable to fully assess;Due to pain (could maintain NWB in standing for short periods only)   Balance Balance Balance Assessed: Yes Static Sitting Balance Static Sitting - Balance Support: Feet supported Static Sitting - Level of Assistance: 5: Stand by assistance Static Sitting - Comment/# of Minutes: 5 Static Standing Balance Static Standing - Balance Support: Bilateral upper extremity supported Static Standing - Level of Assistance: 4: Min assist Static Standing - Comment/# of Minutes: 1  End of Session PT - End of Session Activity Tolerance: Patient tolerated treatment well Patient left: in chair;with call bell/phone within reach Nurse Communication: Mobility status  GP     Mabry Tift, Eliseo Gum 04/14/2013, 12:47 PM 04/14/2013  [place] Bing, PT [phone]  (pager)

## 2013-04-14 NOTE — Progress Notes (Signed)
Pt output this shift is , pt has NS running at 89ml/hr to her IV. MD on call, Magick[place]ine paged via amion textpage. Will continue to monitor and assess. Baron Hamper, RN 04/14/2013

## 2013-04-14 NOTE — Progress Notes (Signed)
Pt HR up to 140 non-sustained. Pt asymptomatic. BP 104/48, HR now sustaining in 80s. MD paged via [facility] text page. Will continue to monitor pt. Baron Hamper, RN 04/14/2013

## 2013-04-14 NOTE — Progress Notes (Signed)
PULMONARY  / CRITICAL CARE MEDICINE  Name: Alyssa Knight MRN: [id number] DOB: April 30, 1917    ADMISSION DATE:  04/10/2013 CONSULTATION DATE:  04/11/13  REFERRING MD :  Dr. Rennis Chris PRIMARY SERVICE: [facility]-->PCCM  CHIEF COMPLAINT:  Post-Op Respiratory Failure  BRIEF PATIENT DESCRIPTION:  79 yo female had fall at home resulting in displaced Lt femur fracture.  Had ORIF 6/16.  Developed VDRF in PACU, and PCCM assumed care while in ICU.  SIGNIFICANT EVENTS / STUDIES:  6/15 - admit s/p mechanical fall with L distal femur fx 6/16 - ORIF; VDRF in PACU  LINES / TUBES: OETT 6/16>>>6/17  CULTURES:   ANTIBIOTICS: Ancef 6/16 (post-op)  SUBJECTIVE:  Feels better.  Denies chest pain.  VITAL SIGNS: Temp:  [97.6 F (36.4 C)-98.4 F (36.9 C)] 98.4 F (36.9 C) (06/19 0532) Pulse Rate:  [66-106] 90 (06/19 0532) Resp:  [19-32] 20 (06/19 0532) BP: (94-124)/(31-74) 96/51 mmHg (06/19 0532) SpO2:  [98 %-100 %] 98 % (06/19 0532) Weight:  [72.3 kg (159 lb 6.3 oz)-72.9 kg (160 lb 11.5 oz)] 72.3 kg (159 lb 6.3 oz) (06/19 0532)  INTAKE / OUTPUT: Intake/Output     06/18 0701 - 06/19 0700 06/19 0701 - 06/20 0700   P.O. 300 120   I.V. (mL/kg) 411.7 (5.7)    Total Intake(mL/kg) 711.7 (9.8) 120 (1.7)   Urine (mL/kg/hr) 398 (0.2)    Total Output 398     Net +313.7 +120          PHYSICAL EXAMINATION: General: No distress Neuro: Alert, follows commands HEENT: no sinus tenderness Cardiovascular: regular Lungs: decreased breath sounds Lt base Abdomen: soft, non tender Musculoskeletal: no edema Skin: LLE with great toe mild necrosis   LABS:  Recent Labs Lab 04/12/13 2210 04/13/13 0440 04/14/13 0600  NA 135 134* 134*  K 4.7 4.9 5.3*  CL 101 99 102  CO2 28 25 25   BUN 33* 37* 48*  CREATININE 2.02* 2.14* 2.24*  GLUCOSE 156* 183* 133*    Recent Labs Lab 04/12/13 0320 04/13/13 0440 04/14/13 0600  HGB 7.9* 8.0* 7.0*  HCT 24.2* 25.0* 21.6*  WBC 14.5* 23.9* 21.5*  PLT 171 193 188     Recent Labs Lab 04/13/13 0750 04/13/13 1210 04/13/13 1612 04/13/13 2109 04/14/13 0608  GLUCAP 114* 195* 222* 201* 138*    Imaging: Dg Chest Port 1 View  04/14/2013   *RADIOLOGY REPORT*  Clinical Data: Follow up pleural effusion.  PORTABLE CHEST - 1 VIEW  Comparison: 04/13/2013  Findings: No significant change in the right hemithorax with diffuse calcifications.  There are persistent densities at the left lung base suggestive for pleural fluid and volume loss.  Calcified granuloma in the left apex.  Heart size is grossly stable.  No significant pneumothorax. Decreased lung volumes compared to the previous examination.  IMPRESSION: Low lung volumes with vascular crowding.  Difficult to exclude mild edema.  Persistent left basilar densities are suggestive for pleural fluid and atelectasis. Densities at the left lung base have minimally changed.   Original Report Authenticated By: Richarda Overlie, M.D.   Dg Chest Port 1 View  04/13/2013   *RADIOLOGY REPORT*  Clinical Data: Post left thoracentesis  PORTABLE CHEST - 1 VIEW  Comparison: Portable exam 1513 hours compared to 0501 hours  Findings: Right fibrothorax again identified. Enlargement of cardiac silhouette with pulmonary vascular congestion. Atherosclerotic calcification aorta. Significant decrease in left pleural effusion post thoracentesis. Persistent atelectasis versus consolidation at left base. No definite pneumothorax following thoracentesis. Calcified granuloma left  upper lobe. Diffuse osseous demineralization.  IMPRESSION: Decrease in left pleural effusion and basilar atelectasis post thoracentesis without evidence of pneumothorax. Right fibrothorax. Enlargement of cardiac silhouette with pulmonary vascular congestion.   Original Report Authenticated By: Ulyses Southward, M.D.   Dg Chest Port 1 View  04/13/2013   *RADIOLOGY REPORT*  Clinical Data: SOB, follow up pleural effusion, CHF  PORTABLE CHEST - 1 VIEW  Comparison: 04/12/2013  Findings:  Interval extubation.  Chronic changes in the right hemithorax with dense pleural calcifications and volume loss.  Cardiomegaly with mild left perihilar edema, increased. Moderate left pleural effusion with associated left lower lobe opacity, possibly atelectasis, unchanged.  No pneumothorax.  IMPRESSION: Interval extubation.  Cardiomegaly with mild interstitial edema, increased.  Moderate left pleural effusion with associated left lower lobe opacity, possibly atelectasis, unchanged.   Original Report Authenticated By: Charline Bills, M.D.   Dg Chest Port 1 View  04/12/2013   *RADIOLOGY REPORT*  Clinical Data: CHF, pleural effusion  PORTABLE CHEST - 1 VIEW  Comparison: 04/11/2013  Findings: The cardiac shadow is stable.  An endotracheal tube remains in satisfactory position.  Diffuse pleural calcifications are noted on the right and stable.  A moderate-sized left-sided pleural effusion is again seen.  There is likely underlying left basilar infiltrate.  IMPRESSION: Chronic changes as described.  Persistent left pleural effusion and likely basilar infiltrate.   Original Report Authenticated By: Alcide Clever, M.D.     ASSESSMENT / PLAN:  PULMONARY A: Acute respiratory failure 2nd to pulmonary edema most likely from negative pressure pulmonary edema >> resolved. Lt pleural effusion. S/p thoracentesis on L.  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 Hx of HTN. P:  -f/u Echo see report  Left ventricle: The cavity size was normal. Systolic function was moderately to severely reduced. The estimated ejection fraction was in the range of 30% to 35%. There is akinesis of the basal-midinferolateral myocardium. Doppler parameters are consistent with a reversible restrictive pattern, indicative  of decreased left ventricular diastolic compliance and/or increased left atrial pressure (grade 3 diastolic dysfunction).   -continue ASA -resume bidil 6/18  RENAL Lab Results  Component Value Date   CREATININE 2.24* 04/14/2013   CREATININE 2.14* 04/13/2013   CREATININE 2.02* 04/12/2013    A: Acute on Chronic renal failure (BL scr 1.4). S cr rising.  P:   -monitor renal fx -send urine studies  D/c foley  -hold further diuresis for now  GASTROINTESTINAL A: Nutrition. P:   -Protonix for SUP -carb modified diet  HEMATOLOGIC  Recent Labs  04/13/13 0440 04/14/13 0600  HGB 8.0* 7.0*    A: Anemia. (ACD) P:  -f/u CBC -transfuse for Hb < 7 -continue lovenox for DVT prevention  INFECTIOUS A: No evidence for infection. P:   -monitor clinically  ENDOCRINE A: No issues. P:   -monitor blood sugar on BMET  NEUROLOGIC A: Pain - post op ORIF L femur fx P:   -PRN fentanyl for pain  -Robaxin for muscle spasms  Updated pt's family.   Brett Canales Minor ACNP Adolph Pollack PCCM Pager [phone] till 3 pm If no answer page [phone] 04/14/2013, 9:39 AM   Pt seen and examined with Minor NP.   I agree with the above note. Fluid from pleural space essentially benign.  PCCM will see again prn tfr to trh.  Dorcas Carrow Beeper  [phone]  Cell  [phone]  If no response or cell goes to voicemail, call beeper [phone]

## 2013-04-14 NOTE — Progress Notes (Signed)
[facility]                                                                                Patient Demographics  Alyssa Knight, is a 79 y.o. female, DOB - 04/06/1917, ZOX:[id number], WJX:[id number]  Admit date - 04/10/2013  Admitting Physician Alyssa Mage, MD  Outpatient Primary MD for the patient is No primary provider on file.  LOS - 4   Chief Complaint  Patient presents with  . 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 Had ORIF 6/16. Developed VDRF in PACU, and PCCM assumed care while in ICU. While in ICU she developed Leukocytosis, Anemia, ARF, Pl effusions needing thoracentesis, transferred to Hospitalist 04-14-13   1. Mech-Fall - Post L Fem fracture post ORIF Dr Alyssa Knight 04-11-13, management per Ortho, WBC rising Ortho requested to reeval wound. PT, Lovenox.   2.Acute Resp Failure in PACU with L>R Pl effusion neededing thoracentesis likely from negative Pressure (/ mild acute on chr CHF) - better now no SOB, PCCM following, Pl fluid results Alyssa/W PCCM Alyssa Knight he will monitor.   3.Acute on Chr Mild Combined Systolic + Diastolic CHF - now compensated, ACE/ARB, diuretics held due to ARF, continue Imdur-hydralazine, B Blocker later if BP stable.   4.ARF on CKD 3 (1.4) - Ur sodium low, gentle IVF, monitor.   5.Anemia - AOCD + operative blood loss+ dilutional - type screened, if <7 or BP low, transfuse. An panel.   6.High K - kayaxalate - repeat K.   7.Leukocytosis - ? Leg wound, ortho requested to re eval, no diarrhea, monitor UC, monitor temp and Pl fluid.    8.DM-2 - A1c, ISS      Code Status: Full  Family Communication: None Present  Disposition Plan: TBD   Procedures Thoracentesis, ETT, ORIF   Consults   PCCM,Ortho   DVT Prophylaxis  Lovenox    Lab Results  Component Value Date   PLT 188 04/14/2013    Medications  Scheduled Meds: . aspirin EC  325 mg Oral Q breakfast  . docusate sodium  100 mg Oral BID  . enoxaparin (LOVENOX) injection  30 mg Subcutaneous QHS  . feeding supplement  237 mL Oral BID BM  . isosorbide-hydrALAZINE  1 tablet Oral BID  . pantoprazole  40 mg Oral Q1200   Continuous Infusions: . sodium chloride 50 mL/hr at 04/13/13 1422   PRN Meds:.acetaminophen, fentaNYL, methocarbamol (ROBAXIN) IV, methocarbamol, ondansetron (ZOFRAN) IV, polyethylene glycol  Antibiotics     Anti-infectives   Start     Dose/Rate Route Frequency Ordered Stop   04/11/13 2000  ceFAZolin (ANCEF) IVPB 2 g/50 mL premix     2 g 100 mL/hr over 30 Minutes Intravenous Every 6 hours 04/11/13 1939 04/12/13 0300   04/11/13 1315  ceFAZolin (ANCEF) IVPB 2 g/50 mL premix     2 g 100 mL/hr over 30 Minutes  Intravenous  Once 04/11/13 1359 04/11/13 1313       Time Spent in minutes   45   Alyssa Knight K M.Alyssa on 04/14/2013 at 7:43 PM  Between 7am to 7pm - Pager - [phone]  After 7pm go to [email or web address] - password [email or web address]  And look for the night coverage person covering for me after hours  [facility] Office  [phone]    Subjective:   Alyssa Knight today has, No headache, No chest pain, No abdominal pain - No Nausea, No new weakness tingling or numbness, No Cough - SOB.    Objective:   Filed Vitals:   04/13/13 2100 04/14/13 0037 04/14/13 0532 04/14/13 1433  BP: 110/60 104/48 96/51 116/60  Pulse:   90 96  Temp:   98.4 F (36.9 C) 98.4 F (36.9 C)  TempSrc:   Oral Oral  Resp:   20 18  Height:      Weight:   72.3 kg (159 lb 6.3 oz)   SpO2:   98% 100%    Wt Readings from Last 3 Encounters:  04/14/13 72.3 kg (159 lb 6.3 oz)  04/14/13 72.3 kg (159 lb 6.3 oz)     Intake/Output Summary (Last 24 hours) at 04/14/13 1943 Last data filed at 04/14/13 1835  Gross  per 24 hour  Intake    780 ml  Output    308 ml  Net    472 ml    Exam Awake Alert, Oriented X 3, No new F.N deficits, Normal affect [place].AT,PERRAL Supple Neck,No JVD, No cervical lymphadenopathy appriciated.  Symmetrical Chest wall movement, Good air movement bilaterally, CTAB RRR,No Gallops,Rubs or new Murmurs, No Parasternal Heave +ve B.Sounds, Abd Soft, Non tender, No organomegaly appriciated, No rebound - guarding or rigidity. No Cyanosis, Clubbing or edema, No new Rash or bruise < L Knee in bandage and immobalizer   Data Review   Micro Results Recent Results (from the past 240 hour(s))  SURGICAL PCR SCREEN     Status: None   Collection Time    04/11/13  7:50 AM      Result Value Range Status   MRSA, PCR NEGATIVE  NEGATIVE Final   Staphylococcus aureus NEGATIVE  NEGATIVE Final   Comment:            The Xpert SA Assay (FDA     approved for NASAL specimens     in patients over 54 years of age),     is one component of     a comprehensive surveillance     program.  Test performance has     been validated by [employer] for patients greater     than or equal to 40 year old.     It is not intended     to diagnose infection nor to     guide or monitor treatment.  URINE CULTURE     Status: None   Collection Time    04/11/13 11:15 PM      Result Value Range Status   Specimen Description URINE, CATHETERIZED   Final   Special Requests ADDED 2352   Final   Culture  Setup Time 04/12/2013 00:49   Final   Colony Count NO GROWTH   Final   Culture NO GROWTH   Final   Report Status 04/13/2013 FINAL   Final  URINE CULTURE     Status: None   Collection Time    04/13/13  1:31 PM  Result Value Range Status   Specimen Description URINE, CATHETERIZED   Final   Special Requests NONE   Final   Culture  Setup Time 04/13/2013 17:37   Final   Colony Count NO GROWTH   Final   Culture NO GROWTH   Final   Report Status 04/14/2013 FINAL   Final  BODY FLUID CULTURE     Status:  None   Collection Time    04/13/13  3:06 PM      Result Value Range Status   Specimen Description PLEURAL FLUID LEFT   Final   Special Requests FLUID   Final   Gram Stain     Final   Value: RARE WBC PRESENT,BOTH PMN AND MONONUCLEAR     NO ORGANISMS SEEN   Culture NO GROWTH 1 DAY   Final   Report Status PENDING   Incomplete  AFB CULTURE WITH SMEAR     Status: None   Collection Time    04/13/13  3:06 PM      Result Value Range Status   Specimen Description PLEURAL FLUID LEFT   Final   Special Requests FLUID   Final   ACID FAST SMEAR NO ACID FAST BACILLI SEEN   Final   Culture     Final   Value: CULTURE WILL BE EXAMINED FOR 6 WEEKS BEFORE ISSUING A FINAL REPORT   Report Status PENDING   Incomplete    Radiology Reports Dg Femur Left  04/11/2013   *RADIOLOGY REPORT*  Clinical Data: The distal left femur fracture.  LEFT FEMUR - 2 VIEW,DG C-ARM 1-60 MIN  Comparison: Radiographs dated 04/10/2013  Findings: AP and lateral views of the distal left femur demonstrate near anatomic alignment and position of the fracture after open reduction and internal fixation.  Side plate and multiple screws are in place.  C-arm imaging was provided during the operation.  Fluoroscopy time:  26 seconds.  IMPRESSION: Open reduction and internal fixation of the distal femur fracture.   Original Report Authenticated By: Francene Boyers, M.Alyssa.   Dg Chest Port 1 View  04/14/2013   *RADIOLOGY REPORT*  Clinical Data: Follow up pleural effusion.  PORTABLE CHEST - 1 VIEW  Comparison: 04/13/2013  Findings: No significant change in the right hemithorax with diffuse calcifications.  There are persistent densities at the left lung base suggestive for pleural fluid and volume loss.  Calcified granuloma in the left apex.  Heart size is grossly stable.  No significant pneumothorax. Decreased lung volumes compared to the previous examination.  IMPRESSION: Low lung volumes with vascular crowding.  Difficult to exclude mild  edema.  Persistent left basilar densities are suggestive for pleural fluid and atelectasis. Densities at the left lung base have minimally changed.   Original Report Authenticated By: Richarda Overlie, M.Alyssa.     Dg Knee Complete 4 Views Left  04/10/2013   *RADIOLOGY REPORT*  Clinical Data: Pain post fall  LEFT KNEE - COMPLETE 4+ VIEW  Comparison: None  Findings: Four views of the left knee submitted.  There is displaced fracture of distal left femur lateral femoral condyle. Moderate joint effusion.  Spurring of patella.  IMPRESSION: Displaced fracture of distal left femur lateral femoral condyle.   Original Report Authenticated By: Natasha Mead, M.Alyssa.   Dg C-arm 1-60 Min  04/11/2013   *RADIOLOGY REPORT*  Clinical Data: The distal left femur fracture.  LEFT FEMUR - 2 VIEW,DG C-ARM 1-60 MIN  Comparison: Radiographs dated 04/10/2013  Findings: AP and lateral views of  the distal left femur demonstrate near anatomic alignment and position of the fracture after open reduction and internal fixation.  Side plate and multiple screws are in place.  C-arm imaging was provided during the operation.  Fluoroscopy time:  26 seconds.  IMPRESSION: Open reduction and internal fixation of the distal femur fracture.   Original Report Authenticated By: Francene Boyers, M.Alyssa.    [facility]  Recent Labs Lab 04/10/13 1841 04/11/13 0455 04/11/13 1417 04/11/13 1933 04/12/13 0320 04/13/13 0440 04/14/13 0600  WBC 10.4 7.2  --  13.2* 14.5* 23.9* 21.5*  HGB 10.4* 8.5* 8.8* 8.3* 7.9* 8.0* 7.0*  HCT 31.8* 26.2* 26.0* 25.9* 24.2* 25.0* 21.6*  PLT 205 183  --  171 171 193 188  MCV 85.5 85.3  --  85.2 84.9 85.3 85.4  MCH 28.0 27.7  --  27.3 27.7 27.3 27.7  MCHC 32.7 32.4  --  32.0 32.6 32.0 32.4  RDW 15.5 15.7*  --  15.7* 15.7* 15.8* 15.7*  LYMPHSABS 0.9  --   --   --   --   --   --   MONOABS 0.8  --   --   --   --   --   --   EOSABS 0.1  --   --   --   --   --   --   BASOSABS 0.0  --   --   --   --   --   --     Chemistries   Recent  Labs Lab 04/11/13 0455 04/11/13 1417 04/12/13 0320 04/12/13 2210 04/13/13 0440 04/14/13 0600  NA 138 139 136 135 134* 134*  K 4.0 4.3 5.1 4.7 4.9 5.3*  CL 104  --  101 101 99 102  CO2 28  --  26 28 25 25   GLUCOSE 138* 109* 140* 156* 183* 133*  BUN 27*  --  27* 33* 37* 48*  CREATININE 1.52*  --  1.78* 2.02* 2.14* 2.24*  CALCIUM 8.3*  --  7.9* 8.2* 8.3* 8.1*  MG  --   --   --  2.1  --   --    ------------------------------------------------------------------------------------------------------------------ estimated creatinine clearance is 14.9 ml/min (by C-G formula based on Cr of 2.24). ------------------------------------------------------------------------------------------------------------------ No results found for this basename: HGBA1C,  in the last 72 hours ------------------------------------------------------------------------------------------------------------------ No results found for this basename: CHOL, HDL, LDLCALC, TRIG, CHOLHDL, LDLDIRECT,  in the last 72 hours ------------------------------------------------------------------------------------------------------------------ No results found for this basename: TSH, T4TOTAL, FREET3, T3FREE, THYROIDAB,  in the last 72 hours ------------------------------------------------------------------------------------------------------------------ No results found for this basename: VITAMINB12, FOLATE, FERRITIN, TIBC, IRON, RETICCTPCT,  in the last 72 hours  Coagulation profile  Recent Labs Lab 04/10/13 1841  INR 1.08    No results found for this basename: DDIMER,  in the last 72 hours  Cardiac Enzymes  Recent Labs Lab 04/12/13 0710 04/12/13 1145 04/12/13 1852  TROPONINI <0.30 <0.30 <0.30   ------------------------------------------------------------------------------------------------------------------ No components found with this basename: POCBNP,

## 2013-04-15 DIAGNOSIS — S7290XA Unspecified fracture of unspecified femur, initial encounter for closed fracture: Secondary | ICD-10-CM

## 2013-04-15 DIAGNOSIS — E119 Type 2 diabetes mellitus without complications: Secondary | ICD-10-CM

## 2013-04-15 DIAGNOSIS — I509 Heart failure, unspecified: Secondary | ICD-10-CM

## 2013-04-15 DIAGNOSIS — I10 Essential (primary) hypertension: Secondary | ICD-10-CM

## 2013-04-15 DIAGNOSIS — N183 Chronic kidney disease, stage 3 (moderate): Secondary | ICD-10-CM

## 2013-04-15 DIAGNOSIS — J9600 Acute respiratory failure, unspecified whether with hypoxia or hypercapnia: Secondary | ICD-10-CM

## 2013-04-15 LAB — GLUCOSE, CAPILLARY
Glucose-Capillary: 129 mg/dL — ABNORMAL HIGH (ref 70–99)
Glucose-Capillary: 111 mg/dL — ABNORMAL HIGH (ref 70–99)

## 2013-04-15 LAB — HEMOGLOBIN A1C: Mean Plasma Glucose: 137 mg/dL — ABNORMAL HIGH (ref <117)

## 2013-04-15 LAB — COMPREHENSIVE METABOLIC PANEL
ALT: <5 U/L (ref 0–35)
Calcium: 7.8 mg/dL — ABNORMAL LOW (ref 8.4–10.5)
GFR calc Af Amer: 23 mL/min — ABNORMAL LOW (ref >90)
Glucose, Bld: 147 mg/dL — ABNORMAL HIGH (ref 70–99)
Sodium: 137 mEq/L (ref 135–145)
Total Protein: 5.8 g/dL — ABNORMAL LOW (ref 6.0–8.3)

## 2013-04-15 LAB — CBC
MCH: 27.6 pg (ref 26.0–34.0)
MCHC: 32.1 g/dL (ref 30.0–36.0)
Platelets: 214 10*3/uL (ref 150–400)
RBC: 2.57 MIL/uL — ABNORMAL LOW (ref 3.87–5.11)

## 2013-04-15 LAB — URINE CULTURE
Colony Count: NO GROWTH
Culture: NO GROWTH

## 2013-04-15 LAB — TSH: TSH: 2.208 u[IU]/mL (ref 0.350–4.500)

## 2013-04-15 LAB — FERRITIN: Ferritin: 143 ng/mL (ref 10–291)

## 2013-04-15 MED ORDER — METOPROLOL TARTRATE 1 MG/ML IV SOLN
10.0000 mg | Freq: Once | INTRAVENOUS | Status: AC
  Administered 2013-04-15: 10 mg via INTRAVENOUS
  Filled 2013-04-15: qty 10

## 2013-04-15 MED ORDER — METOPROLOL TARTRATE 50 MG PO TABS
50.0000 mg | ORAL_TABLET | Freq: Two times a day (BID) | ORAL | Status: DC
  Administered 2013-04-15 – 2013-04-16 (×3): 50 mg via ORAL
  Filled 2013-04-15 (×5): qty 1

## 2013-04-15 MED ORDER — INSULIN ASPART 100 UNIT/ML ~~LOC~~ SOLN
0.0000 [IU] | Freq: Three times a day (TID) | SUBCUTANEOUS | Status: DC
  Administered 2013-04-15 – 2013-04-16 (×3): 1 [IU] via SUBCUTANEOUS

## 2013-04-15 NOTE — Progress Notes (Signed)
Pt oob with therapy, dressing to left knee changed, no s/s of infection, pt received 1 unit of blood, labs to be drawn at 1830, will continue to monitor

## 2013-04-15 NOTE — Plan of Care (Signed)
Problem: Phase II Progression Outcomes Goal: Bed to chair Outcome: Completed/Met Date Met:  04/15/13 Pt OOB to chair today, 2 RN assisted pt back to bed with use of walker. Pt tolerated well

## 2013-04-15 NOTE — Progress Notes (Signed)
Alyssa Knight  MRN: [id number] DOB/Age: 1916-12-05 79 y.o. Physician: Jacquelyne Balint Procedure: Procedure(s) (LRB): OPEN REDUCTION INTERNAL FIXATION (ORIF) DISTAL FEMUR FRACTURE- left (Left)     Subjective: Up in chair today. Alert and awake and appropriate, no specific c/o.   Vital Signs Temp:  [98 F (36.7 C)-98.4 F (36.9 C)] 98.2 F (36.8 C) (06/20 0555) Pulse Rate:  [94-145] 95 (06/20 0555) Resp:  [18] 18 (06/20 0555) BP: (101-144)/(48-77) 101/56 mmHg (06/20 0555) SpO2:  [94 %-100 %] 94 % (06/20 0555) Weight:  [73.6 kg (162 lb 4.1 oz)] 73.6 kg (162 lb 4.1 oz) (06/20 0555)  Lab Results  Recent Labs  04/14/13 0600 04/15/13 0630  WBC 21.5* 15.1*  HGB 7.0* 7.1*  HCT 21.6* 22.1*  PLT 188 214   BMET  Recent Labs  04/14/13 0600 04/14/13 2100 04/15/13 0630  NA 134*  --  137  K 5.3* 4.4 4.2  CL 102  --  103  CO2 25  --  24  GLUCOSE 133*  --  147*  BUN 48*  --  51*  CREATININE 2.24*  --  2.06*  CALCIUM 8.1*  --  7.8*   INR  Date Value Range Status  04/10/2013 1.08  0.00 - 1.49 Final     Exam Left leg dressing dry, padded knee immobilzer. NVI        Plan Will have dressing changed.  Ok for gentle ROM but still NWB Will see again on Monday, call through weekend if needs arise  [facility] for Dr.Kevin Supple 04/15/2013, 9:37 AM

## 2013-04-15 NOTE — Progress Notes (Signed)
Physical Therapy Treatment Patient Details Name: Alyssa Knight MRN: [id number] DOB: 1916/11/12 Today's Date: 04/15/2013 Time: 7829-5621 PT Time Calculation (min): 23 min  PT Assessment / Plan / Recommendation Comments on Treatment Session  Pt very pleasant & willing to participate in therapy.  She verbalizes understanding of NWBing LLE but she is unable to maintain during stand pivot transfer.      Follow Up Recommendations  Home health PT     Does the patient have the potential to tolerate intense rehabilitation     Barriers to Discharge        Equipment Recommendations  Rolling walker with 5" wheels;Other (comment);Wheelchair (measurements PT);Wheelchair cushion (measurements PT) (3-in-1, tub bench)    Recommendations for Other Services    Frequency Min 3X/week   Plan Discharge plan remains appropriate;Frequency remains appropriate    Precautions / Restrictions Precautions Precautions: Fall Precaution Comments: gentle AROM L knee Required Braces or Orthoses: Knee Immobilizer - Left Restrictions Weight Bearing Restrictions: Yes LLE Weight Bearing: Non weight bearing       Mobility  Bed Mobility Bed Mobility: Not assessed Transfers Transfers: Sit to Stand;Stand to Sit;Stand Pivot Transfers Sit to Stand: 4: Min assist;With upper extremity assist;From bed;From chair/3-in-1;With armrests Stand to Sit: 4: Min assist;With upper extremity assist;With armrests;To chair/3-in-1;To bed Stand Pivot Transfers: 4: Min assist Details for Transfer Assistance: Cues for hand placement, LLE positioning, & technique.  Pt unable to maintain NWBing LLE with SPT at this time.   Ambulation/Gait Stairs: No Wheelchair Mobility Wheelchair Mobility: No      PT Goals Acute Rehab PT Goals Time For Goal Achievement: 04/28/13 Potential to Achieve Goals: Good Pt will go Supine/Side to Sit: with supervision Pt will go Sit to Stand: Other (comment) PT Goal: Sit to Stand - Progress: Progressing  toward goal Pt will Transfer Bed to Chair/Chair to Bed: Other (comment) PT Transfer Goal: Bed to Chair/Chair to Bed - Progress: Progressing toward goal Pt will Ambulate: 1 - 15 feet;with min assist;with least restrictive assistive device;with rolling walker Pt will Go Up / Down Stairs: 1-2 stairs;with +1 total assist;with other equipment (comment)  Visit Information  Last PT Received On: 04/15/13 Assistance Needed: +1    Subjective Data      Cognition  Cognition Arousal/Alertness: Awake/alert Behavior During Therapy: WFL for tasks assessed/performed Overall Cognitive Status: Within Functional Limits for tasks assessed    Balance     End of Session PT - End of Session Equipment Utilized During Treatment: Gait belt;Left knee immobilizer Activity Tolerance: Patient tolerated treatment well Patient left: in chair;with call bell/phone within reach Nurse Communication: Mobility status     Verdell Face, [place] [phone] 04/15/2013

## 2013-04-15 NOTE — Progress Notes (Signed)
Occupational Therapy Treatment Patient Details Name: Alyssa Knight MRN: [id number] DOB: 1916-12-10 Today's Date: 04/15/2013 Time: 1336-1400 OT Time Calculation (min): 24 min  OT Assessment / Plan / Recommendation Comments on Treatment Session Pt in great spirits and reports no pain currently with activities.  Educated on AE for LB selfcare tasks and performed simple stand pivot transfers in preparation for toileting tasks.  Pt still needing mod facilitation for transfers in order to maintain NWBing status on the LLE.      Follow Up Recommendations  Home health OT;Supervision/Assistance - 24 hour       Equipment Recommendations  3 in 1 bedside comode;Tub/shower bench;Wheelchair cushion (measurements OT);Wheelchair (measurements OT)       Frequency Min 2X/week   Plan Discharge plan remains appropriate    Precautions / Restrictions Precautions Precautions: Fall Precaution Comments: gentle AROM L knee Required Braces or Orthoses: Knee Immobilizer - Left Restrictions Weight Bearing Restrictions: Yes LLE Weight Bearing: Non weight bearing   Pertinent Vitals/Pain Pt with no report of pain. O2 sats 93-94% on room air.    ADL  Lower Body Dressing: Performed;Moderate assistance Where Assessed - Lower Body Dressing: Supported sit to Pharmacist, hospital: Simulated;Moderate assistance Toilet Transfer Method: Stand pivot Toilet Transfer Equipment: Bedside commode Toileting - Clothing Manipulation and Hygiene: Simulated;Moderate assistance Where Assessed - Toileting Clothing Manipulation and Hygiene: Sit to stand from 3-in-1 or toilet Equipment Used: Sock aid;Rolling walker;Reacher Transfers/Ambulation Related to ADLs: Pt needs mod assist to take 3-4 small steps with the RW.  She demonstrates increased difficulty and needs assistance to maintain NWBIng over the LLE. ADL Comments: Pt educated on AE use for LB selfcare secondary to not being able to reach her LLE for donning and doffing  sock.      OT Goals ADL Goals Pt Will Perform Grooming: with supervision;Standing at sink Pt Will Perform Lower Body Bathing: with supervision;Sit to stand from chair;with adaptive equipment Pt Will Perform Lower Body Dressing: with supervision;Sit to stand from chair;with adaptive equipment ADL Goal: Lower Body Dressing - Progress: Progressing toward goals Pt Will Transfer to Toilet: with supervision;Ambulation;with DME;3-in-1;Maintaining weight bearing status ADL Goal: Toilet Transfer - Progress: Progressing toward goals Pt Will Perform Toileting - Clothing Manipulation: with supervision;Standing ADL Goal: Toileting - Clothing Manipulation - Progress: Progressing toward goals Pt Will Perform Toileting - Hygiene: with supervision;Sitting on 3-in-1 or toilet ADL Goal: Toileting - Hygiene - Progress: Progressing toward goals Pt Will Perform Tub/Shower Transfer: Tub transfer;with supervision;Ambulation;with DME;Transfer tub bench;Maintaining weight bearing status  Visit Information  Last OT Received On: 04/15/13 Assistance Needed: +1    Subjective Data  Subjective: I just got up in the chair with therapy.      Cognition  Cognition Arousal/Alertness: Awake/alert Behavior During Therapy: WFL for tasks assessed/performed Overall Cognitive Status: Within Functional Limits for tasks assessed    Mobility  Bed Mobility Bed Mobility: Not assessed Transfers Transfers: Sit to Stand Sit to Stand: 3: Mod assist;With armrests;From chair/3-in-1 Stand to Sit: 3: Mod assist;With upper extremity assist;To chair/3-in-1 Details for Transfer Assistance: Cues for hand placement, LLE positioning, & technique.  Pt unable to maintain NWBing LLE with SPT at this time.         Balance Balance Balance Assessed: Yes Static Standing Balance Static Standing - Balance Support: Right upper extremity supported;Left upper extremity supported Static Standing - Level of Assistance: 4: Min assist   End of  Session OT - End of Session Equipment Utilized During Treatment: Gait belt Activity Tolerance: Patient  limited by fatigue Patient left: in chair;with call bell/phone within reach Nurse Communication: Mobility status     Ford Peddie OTR/L 04/15/2013, 2:12 PM

## 2013-04-15 NOTE — Progress Notes (Signed)
Pt HR sustaining in 120-140 BP 136/77. Pt SOB. Oxygen increased to 3L and HOB elevated. Pt states she feels better. MD notified and orders to give lopressor IV. Pt Rhythm looked irregular and like A. Fib on the monitor. EKG performed- Sinus tachycardia with PACs. Baron Hamper, RN 04/15/2013

## 2013-04-15 NOTE — Progress Notes (Signed)
HCPOA documents left for pt at bedside, pt sleeping.  Will ask w/e CSW to follow up.

## 2013-04-15 NOTE — Plan of Care (Signed)
Problem: Phase II Progression Outcomes Goal: Discharge plan established Outcome: Completed/Met Date Met:  04/15/13 Plan for dc is to go home with family until recovered, then return homw  Problem: Phase III Progression Outcomes Goal: Incision clean - minimal/no drainage Outcome: Completed/Met Date Met:  04/15/13 pts drsg changed, incision is clean, no drainage noted, will continue to monitor

## 2013-04-15 NOTE — Progress Notes (Signed)
[facility]                                                                                Patient Demographics  Alyssa Knight, is a 79 y.o. female, DOB - [birth year], WUJ:[id number], [id number]:[id number]  Admit date - 04/10/2013  Admitting Physician Lars Mage, MD  Outpatient Primary MD for the patient is No primary provider on file.  LOS - 5   Chief Complaint  Patient presents with  . 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 Had ORIF 6/16. Developed VDRF in PACU, and PCCM assumed care while in ICU. While in ICU she developed Leukocytosis, Anemia, ARF, Pl effusions needing thoracentesis, transferred to Hospitalist 04-14-13    1. Mech-Fall - Post L Fem fracture post ORIF Dr Rennis Chris 04-11-13, management per Ortho, WBC rising Ortho requested to reeval wound. PT, Lovenox.    2.Acute Resp Failure in PACU with L>R Pl effusion neededing thoracentesis likely from negative Pressure (/ mild acute on chr CHF) pleural fluid appears to be transudative - better now no SOB, PCCM following, Pl fluid results D/W PCCM Dr Craige Cotta on 04-14-13 he will monitor.    3.Acute on Chr Mild Combined Systolic + Diastolic CHF - now compensated, ACE/ARB, diuretics held due to ARF, continue Imdur-hydralazine, B Blocker later if BP stable.    4.ARF on CKD 3 (1.4) - Ur sodium low, gentle IVF, monitor. Trend improving.    5.Anemia - AOCD + operative blood loss+ dilutional - type screened,since  BP low with ARF will  Transfuse 1 unit on 04/15/2013, her anemia panel is stable.    6.High K - kayaxalate - stable.    7.Leukocytosis - ? Leg wound, ortho requested to re eval, no diarrhea, monitor UC, monitor temp and Pl fluid. WBC improving.    8.DM-2 - A1c, ISS  Lab Results   Component Value Date   HGBA1C 6.1* 04/11/2013    CBG (last 3)   Recent Labs  04/13/13 2109 04/14/13 0608 04/15/13 0554  GLUCAP 201* 138* 141*          Code Status: Full  Family Communication: None Present  Disposition Plan: TBD   Procedures Thoracentesis, ETT, ORIF   Consults  PCCM,Ortho   DVT Prophylaxis  Lovenox    Lab Results  Component Value Date   PLT 214 04/15/2013    Medications  Scheduled Meds: . aspirin EC  325 mg Oral Q breakfast  . docusate sodium  100 mg Oral BID  . enoxaparin (LOVENOX) injection  30 mg Subcutaneous QHS  . feeding supplement  237 mL Oral BID BM  . insulin aspart  0-9 Units Subcutaneous TID WC  . isosorbide-hydrALAZINE  1 tablet Oral BID  . metoprolol tartrate  50 mg Oral BID  . pantoprazole  40 mg Oral Q1200   Continuous Infusions:   PRN Meds:.acetaminophen, fentaNYL, methocarbamol (ROBAXIN) IV, methocarbamol, ondansetron (ZOFRAN)  IV, polyethylene glycol  Antibiotics     Anti-infectives   Start     Dose/Rate Route Frequency Ordered Stop   04/11/13 2000  ceFAZolin (ANCEF) IVPB 2 g/50 mL premix     2 g 100 mL/hr over 30 Minutes Intravenous Every 6 hours 04/11/13 1939 04/12/13 0300   04/11/13 1315  ceFAZolin (ANCEF) IVPB 2 g/50 mL premix     2 g 100 mL/hr over 30 Minutes Intravenous  Once 04/11/13 1359 04/11/13 1313       Time Spent in minutes   45   SINGH,PRASHANT K M.D on 04/15/2013 at 9:58 AM  Between 7am to 7pm - Pager - [phone]  After 7pm go to [email or web address] - password [email or web address]  And look for the night coverage person covering for me after hours  [facility] Office  [phone]    Subjective:   Alyssa Knight today has, No headache, No chest pain, No abdominal pain - No Nausea, No new weakness tingling or numbness, No Cough - SOB.    Objective:   Filed Vitals:   04/15/13 0038 04/15/13 0151 04/15/13 0216 04/15/13 0555  BP: 144/54 104/48  101/56  Pulse: 121  94 95  Temp:    98.2 F  (36.8 C)  TempSrc:    Oral  Resp:    18  Height:      Weight:    73.6 kg (162 lb 4.1 oz)  SpO2: 99%   94%    Wt Readings from Last 3 Encounters:  04/15/13 73.6 kg (162 lb 4.1 oz)  04/15/13 73.6 kg (162 lb 4.1 oz)     Intake/Output Summary (Last 24 hours) at 04/15/13 0958 Last data filed at 04/15/13 0823  Gross per 24 hour  Intake    480 ml  Output    226 ml  Net    254 ml    Exam Awake Alert, Oriented X 3, No new F.N deficits, Normal affect [place].AT,PERRAL Supple Neck,No JVD, No cervical lymphadenopathy appriciated.  Symmetrical Chest wall movement, Good air movement bilaterally, CTAB RRR,No Gallops,Rubs or new Murmurs, No Parasternal Heave +ve B.Sounds, Abd Soft, Non tender, No organomegaly appriciated, No rebound - guarding or rigidity. No Cyanosis, Clubbing or edema, No new Rash or bruise < L Knee in bandage and immobalizer   Data Review   Micro Results Recent Results (from the past 240 hour(s))  SURGICAL PCR SCREEN     Status: None   Collection Time    04/11/13  7:50 AM      Result Value Range Status   MRSA, PCR NEGATIVE  NEGATIVE Final   Staphylococcus aureus NEGATIVE  NEGATIVE Final   Comment:            The Xpert SA Assay (FDA     approved for NASAL specimens     in patients over 28 years of age),     is one component of     a comprehensive surveillance     program.  Test performance has     been validated by [employer] for patients greater     than or equal to 88 year old.     It is not intended     to diagnose infection nor to     guide or monitor treatment.  URINE CULTURE     Status: None   Collection Time    04/11/13 11:15 PM      Result Value Range Status   Specimen Description URINE,  CATHETERIZED   Final   Special Requests ADDED 2352   Final   Culture  Setup Time 04/12/2013 00:49   Final   Colony Count NO GROWTH   Final   Culture NO GROWTH   Final   Report Status 04/13/2013 FINAL   Final  URINE CULTURE     Status: None   Collection  Time    04/13/13  1:31 PM      Result Value Range Status   Specimen Description URINE, CATHETERIZED   Final   Special Requests NONE   Final   Culture  Setup Time 04/13/2013 17:37   Final   Colony Count NO GROWTH   Final   Culture NO GROWTH   Final   Report Status 04/14/2013 FINAL   Final  BODY FLUID CULTURE     Status: None   Collection Time    04/13/13  3:06 PM      Result Value Range Status   Specimen Description PLEURAL FLUID LEFT   Final   Special Requests FLUID   Final   Gram Stain     Final   Value: RARE WBC PRESENT,BOTH PMN AND MONONUCLEAR     NO ORGANISMS SEEN   Culture NO GROWTH 1 DAY   Final   Report Status PENDING   Incomplete  AFB CULTURE WITH SMEAR     Status: None   Collection Time    04/13/13  3:06 PM      Result Value Range Status   Specimen Description PLEURAL FLUID LEFT   Final   Special Requests FLUID   Final   ACID FAST SMEAR NO ACID FAST BACILLI SEEN   Final   Culture     Final   Value: CULTURE WILL BE EXAMINED FOR 6 WEEKS BEFORE ISSUING A FINAL REPORT   Report Status PENDING   Incomplete    Radiology Reports Dg Femur Left  04/11/2013   *RADIOLOGY REPORT*  Clinical Data: The distal left femur fracture.  LEFT FEMUR - 2 VIEW,DG C-ARM 1-60 MIN  Comparison: Radiographs dated 04/10/2013  Findings: AP and lateral views of the distal left femur demonstrate near anatomic alignment and position of the fracture after open reduction and internal fixation.  Side plate and multiple screws are in place.  C-arm imaging was provided during the operation.  Fluoroscopy time:  26 seconds.  IMPRESSION: Open reduction and internal fixation of the distal femur fracture.   Original Report Authenticated By: Francene Boyers, M.D.   Dg Chest Port 1 View  04/14/2013   *RADIOLOGY REPORT*  Clinical Data: Follow up pleural effusion.  PORTABLE CHEST - 1 VIEW  Comparison: 04/13/2013  Findings: No significant change in the right hemithorax with diffuse calcifications.  There are  persistent densities at the left lung base suggestive for pleural fluid and volume loss.  Calcified granuloma in the left apex.  Heart size is grossly stable.  No significant pneumothorax. Decreased lung volumes compared to the previous examination.  IMPRESSION: Low lung volumes with vascular crowding.  Difficult to exclude mild edema.  Persistent left basilar densities are suggestive for pleural fluid and atelectasis. Densities at the left lung base have minimally changed.   Original Report Authenticated By: Richarda Overlie, M.D.     Dg Knee Complete 4 Views Left  04/10/2013   *RADIOLOGY REPORT*  Clinical Data: Pain post fall  LEFT KNEE - COMPLETE 4+ VIEW  Comparison: None  Findings: Four views of the left knee submitted.  There is displaced fracture  of distal left femur lateral femoral condyle. Moderate joint effusion.  Spurring of patella.  IMPRESSION: Displaced fracture of distal left femur lateral femoral condyle.   Original Report Authenticated By: Natasha Mead, M.D.   Dg C-arm 1-60 Min  04/11/2013   *RADIOLOGY REPORT*  Clinical Data: The distal left femur fracture.  LEFT FEMUR - 2 VIEW,DG C-ARM 1-60 MIN  Comparison: Radiographs dated 04/10/2013  Findings: AP and lateral views of the distal left femur demonstrate near anatomic alignment and position of the fracture after open reduction and internal fixation.  Side plate and multiple screws are in place.  C-arm imaging was provided during the operation.  Fluoroscopy time:  26 seconds.  IMPRESSION: Open reduction and internal fixation of the distal femur fracture.   Original Report Authenticated By: Francene Boyers, M.D.    [facility]  Recent Labs Lab 04/10/13 1841  04/11/13 1933 04/12/13 0320 04/13/13 0440 04/14/13 0600 04/15/13 0630  WBC 10.4  < > 13.2* 14.5* 23.9* 21.5* 15.1*  HGB 10.4*  < > 8.3* 7.9* 8.0* 7.0* 7.1*  HCT 31.8*  < > 25.9* 24.2* 25.0* 21.6* 22.1*  PLT 205  < > 171 171 193 188 214  MCV 85.5  < > 85.2 84.9 85.3 85.4 86.0  MCH 28.0  < >  27.3 27.7 27.3 27.7 27.6  MCHC 32.7  < > 32.0 32.6 32.0 32.4 32.1  RDW 15.5  < > 15.7* 15.7* 15.8* 15.7* 15.8*  LYMPHSABS 0.9  --   --   --   --   --   --   MONOABS 0.8  --   --   --   --   --   --   EOSABS 0.1  --   --   --   --   --   --   BASOSABS 0.0  --   --   --   --   --   --   < > = values in this interval not displayed.  Chemistries   Recent Labs Lab 04/12/13 0320 04/12/13 2210 04/13/13 0440 04/14/13 0600 04/14/13 2100 04/15/13 0630  NA 136 135 134* 134*  --  137  K 5.1 4.7 4.9 5.3* 4.4 4.2  CL 101 101 99 102  --  103  CO2 26 28 25 25   --  24  GLUCOSE 140* 156* 183* 133*  --  147*  BUN 27* 33* 37* 48*  --  51*  CREATININE 1.78* 2.02* 2.14* 2.24*  --  2.06*  CALCIUM 7.9* 8.2* 8.3* 8.1*  --  7.8*  MG  --  2.1  --   --   --   --   AST  --   --   --   --   --  26  ALT  --   --   --   --   --  <5  ALKPHOS  --   --   --   --   --  61  BILITOT  --   --   --   --   --  0.4   ------------------------------------------------------------------------------------------------------------------ estimated creatinine clearance is 16.2 ml/min (by C-G formula based on Cr of 2.06). ------------------------------------------------------------------------------------------------------------------ No results found for this basename: HGBA1C,  in the last 72 hours ------------------------------------------------------------------------------------------------------------------ No results found for this basename: CHOL, HDL, LDLCALC, TRIG, CHOLHDL, LDLDIRECT,  in the last 72 hours ------------------------------------------------------------------------------------------------------------------ No results found for this basename: TSH, T4TOTAL, FREET3, T3FREE, THYROIDAB,  in the last 72 hours ------------------------------------------------------------------------------------------------------------------  Recent  Labs  04/14/13 2100  VITAMINB12 283  FOLATE 17.5  FERRITIN 143  TIBC 187*   IRON 25*  RETICCTPCT 1.4    Coagulation profile  Recent Labs Lab 04/10/13 1841  INR 1.08    No results found for this basename: DDIMER,  in the last 72 hours  Cardiac Enzymes  Recent Labs Lab 04/12/13 0710 04/12/13 1145 04/12/13 1852  TROPONINI <0.30 <0.30 <0.30   ------------------------------------------------------------------------------------------------------------------ No components found with this basename: POCBNP,

## 2013-04-15 NOTE — Progress Notes (Signed)
04/15/13 1030 This NCM spoke with daughter, Carley Hammed, about discharge plans for pt.  After reviewing home health agencies, Carley Hammed chose [facility].  In addition, pt. needs DME rolling walker, 3-N-1, hospital bed, wheelchair, and overbed table.  TC to Lupita Leash, with [facility], to give referral for [facility] RN, PT/OT, and aide.  TC to Novelty, with [facility], to give DME referral.  Pt. to dc home tomorrow with family.  Family would like ambulance transport for home tomorrow. Lupita Leash, CSW, made aware.   Tera Mater, RN, BSN NCM [phone]

## 2013-04-16 LAB — CBC
HCT: 24.5 % — ABNORMAL LOW (ref 36.0–46.0)
Hemoglobin: 8 g/dL — ABNORMAL LOW (ref 12.0–15.0)
MCH: 28.1 pg (ref 26.0–34.0)
MCHC: 32.7 g/dL (ref 30.0–36.0)
MCV: 86 fL (ref 78.0–100.0)
Platelets: 215 K/uL (ref 150–400)
RBC: 2.85 MIL/uL — ABNORMAL LOW (ref 3.87–5.11)
RDW: 15.7 % — ABNORMAL HIGH (ref 11.5–15.5)
WBC: 9.1 K/uL (ref 4.0–10.5)

## 2013-04-16 LAB — GLUCOSE, CAPILLARY
Glucose-Capillary: 124 mg/dL — ABNORMAL HIGH (ref 70–99)
Glucose-Capillary: 107 mg/dL — ABNORMAL HIGH (ref 70–99)
Glucose-Capillary: 131 mg/dL — ABNORMAL HIGH (ref 70–99)

## 2013-04-16 LAB — BASIC METABOLIC PANEL
Chloride: 103 mEq/L (ref 96–112)
GFR calc Af Amer: 20 mL/min — ABNORMAL LOW (ref >90)
Potassium: 4.5 mEq/L (ref 3.5–5.1)
Sodium: 138 mEq/L (ref 135–145)

## 2013-04-16 LAB — BODY FLUID CULTURE: Culture: NO GROWTH

## 2013-04-16 MED ORDER — POLYETHYLENE GLYCOL 3350 17 G PO PACK: 17.0000 g | PACK | Freq: Every day | ORAL | Status: DC | PRN

## 2013-04-16 MED ORDER — FUROSEMIDE 20 MG PO TABS: 20.0000 mg | ORAL_TABLET | Freq: Every day | ORAL | Status: DC

## 2013-04-16 MED ORDER — METOPROLOL TARTRATE 50 MG PO TABS: 50.0000 mg | ORAL_TABLET | Freq: Two times a day (BID) | ORAL | Status: DC

## 2013-04-16 MED ORDER — ENSURE COMPLETE PO LIQD: 237.0000 mL | Freq: Two times a day (BID) | ORAL | Status: DC

## 2013-04-16 MED ORDER — PANTOPRAZOLE SODIUM 40 MG PO TBEC: 40.0000 mg | DELAYED_RELEASE_TABLET | Freq: Every day | ORAL | Status: DC

## 2013-04-16 NOTE — Progress Notes (Signed)
Repositioned few times & assisted out of bed by this Clinical research associate and Andree Moro. MWB on left leg. No acute distress noted.

## 2013-04-16 NOTE — Discharge Summary (Addendum)
[facility]                                                                                   Alyssa Knight, is a 79 y.o. female  DOB 04/24/1917  MRN [id number].  Admission date:  04/10/2013  Discharge Date:  04/16/2013  Primary MD  No primary provider on file.  Admitting Physician  Lars Mage, MD  Admission Diagnosis  Renal insufficiency [593.9] Femur fracture, left, closed, initial encounter [821.00]  Discharge Diagnosis     Principal Problem:   Femur fracture, left Active Problems:   HTN (hypertension)   Congestive heart failure   Chronic renal insufficiency, stage III (moderate)   Diabetes type 2, controlled   Acute respiratory failure with hypoxia   Acute systolic heart failure   Pleural effusion, left    Past Medical History  Diagnosis Date  . Diabetes mellitus without complication   . HTN (hypertension)   . CKD (chronic kidney disease)   . Systolic CHF     Past Surgical History  Procedure Laterality Date  . Femur fracture surgery Left 03/2013    L distal femur lateral condyle s/p ORIF  . Orif femur fracture Left 04/11/2013    Procedure: OPEN REDUCTION INTERNAL FIXATION (ORIF) DISTAL FEMUR FRACTURE- left;  Surgeon: Senaida Lange, MD;  Location: [facility] OR;  Service: Orthopedics;  Laterality: Left;     Recommendations for primary care physician for things to follow:       Discharge Diagnoses:   Principal Problem:   Femur fracture, left Active Problems:   HTN (hypertension)   Congestive heart failure   Chronic renal insufficiency, stage III (moderate)   Diabetes type 2, controlled   Acute respiratory failure with hypoxia   Acute systolic heart failure   Pleural effusion, left    Discharge Condition: stable   Diet recommendation: See Discharge Instructions below   Consults orthopedics, pulmonary    History of present illness and  Hospital Course:     Kindly see H&P for history of present illness and admission details, please review  complete Labs, Consult reports and Test reports for all details in brief Alyssa Knight, is a 79 y.o. 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 Had ORIF 6/16. Developed VDRF in PACU, and PCCM assumed care while in ICU. While in ICU she developed Leukocytosis, Anemia, ARF, Pl effusions needing thoracentesis, transferred to [facility] 04-14-13.      1. Mech-Fall - Post L Fem fracture post ORIF Dr Rennis Chris 04-11-13, management per Ortho, wound stable her orthopedics who saw the patient yesterday, no weight bearing on the left leg per orthopedics. Patient will see Dr. supple in one week and get further activity instructions at that point.   2.Acute Resp Failure in PACU with L>R Pl effusion neededing thoracentesis likely from negative Pressure ( mild acute on chr CHF) pleural fluid appears to be transudative - better now no SOB, he has come off of oxygen and is now symptom-free, Pl fluid results D/W PCCM Dr Craige Cotta on 04-14-13, he  will follow up on next visit in the office.   3.Acute on Chr Mild Combined Systolic + Diastolic CHF - now compensated, ACE/ARB, diuretics held due to ARF, continue Imdur-hydralazine along with B Blocker was added. If creatinine remains stable consider ACE/ARB as outpatient.   4.ARF on CKD 3 (baseline appears to be close to 1.8) - Ur sodium low, post gentle IVF, monitor. Trend improving. Have lowered her home Lasix dose which will be held till the 25th of this month.   5.Anemia - AOCD + operative blood loss+ dilutional - type screened,since BP low with ARF will Transfuse 1 unit on 04/15/2013, her anemia panel is stable.    6. mild chronic DM-2 - A1c, at her age I would just do diet control.  Lab Results   Component  Value  Date    HGBA1C  6.1*  04/11/2013    CBG (last 3)   Recent Labs  04/15/13 2116 04/15/13 2119 04/16/13 0709  GLUCAP 117* 111*  124*      Today   Subjective:   Alyssa Knight today has no headache,no chest abdominal pain,no new weakness tingling or numbness, feels much better wants to go home today.   Objective:   Blood pressure 103/56, pulse 62, temperature 98 F (36.7 C), temperature source Oral, resp. rate 18, height 5\' 7"  (1.702 m), weight 73.2 kg (161 lb 6 oz), SpO2 94.00%.   Intake/Output Summary (Last 24 hours) at 04/16/13 1018 Last data filed at 04/16/13 0816  Gross per 24 hour  Intake 1229.5 ml  Output    225 ml  Net 1004.5 ml    Exam Awake Alert, Oriented *3, No new F.N deficits, Normal affect [place].AT,PERRAL Supple Neck,No JVD, No cervical lymphadenopathy appriciated.  Symmetrical Chest wall movement, Good air movement bilaterally, CTAB RRR,No Gallops,Rubs or new Murmurs, No Parasternal Heave +ve B.Sounds, Abd Soft, Non tender, No organomegaly appriciated, No rebound -guarding or rigidity. No Cyanosis, Clubbing or edema, No new Rash or bruise, left knee immobilizer in place  Data Review   Major procedures and Radiology Reports - PLEASE review detailed and final reports for all details in brief -      Dg Femur Left  04/11/2013   *RADIOLOGY REPORT*  Clinical Data: The distal left femur fracture.  LEFT FEMUR - 2 VIEW,DG C-ARM 1-60 MIN  Comparison: Radiographs dated 04/10/2013  Findings: AP and lateral views of the distal left femur demonstrate near anatomic alignment and position of the fracture after open reduction and internal fixation.  Side plate and multiple screws are in place.  C-arm imaging was provided during the operation.  Fluoroscopy time:  26 seconds.  IMPRESSION: Open reduction and internal fixation of the distal femur fracture.   Original Report Authenticated By: Francene Boyers, M.D.   Dg Chest Port 1 View  04/14/2013   *RADIOLOGY REPORT*  Clinical Data: Follow up pleural effusion.  PORTABLE CHEST - 1 VIEW  Comparison: 04/13/2013  Findings: No significant change in the right  hemithorax with diffuse calcifications.  There are persistent densities at the left lung base suggestive for pleural fluid and volume loss.  Calcified granuloma in the left apex.  Heart size is grossly stable.  No significant pneumothorax. Decreased lung volumes compared to the previous examination.  IMPRESSION: Low lung volumes with vascular crowding.  Difficult to exclude mild edema.  Persistent left basilar densities are suggestive for pleural fluid and atelectasis. Densities at the left lung base have minimally changed.   Original Report Authenticated By: Richarda Overlie,  M.D.   Dg Chest Port 1 View  04/13/2013   *RADIOLOGY REPORT*  Clinical Data: Post left thoracentesis  PORTABLE CHEST - 1 VIEW  Comparison: Portable exam 1513 hours compared to 0501 hours  Findings: Right fibrothorax again identified. Enlargement of cardiac silhouette with pulmonary vascular congestion. Atherosclerotic calcification aorta. Significant decrease in left pleural effusion post thoracentesis. Persistent atelectasis versus consolidation at left base. No definite pneumothorax following thoracentesis. Calcified granuloma left upper lobe. Diffuse osseous demineralization.  IMPRESSION: Decrease in left pleural effusion and basilar atelectasis post thoracentesis without evidence of pneumothorax. Right fibrothorax. Enlargement of cardiac silhouette with pulmonary vascular congestion.   Original Report Authenticated By: Ulyses Southward, M.D.   Dg Chest Port 1 View  04/13/2013   *RADIOLOGY REPORT*  Clinical Data: SOB, follow up pleural effusion, CHF  PORTABLE CHEST - 1 VIEW  Comparison: 04/12/2013  Findings: Interval extubation.  Chronic changes in the right hemithorax with dense pleural calcifications and volume loss.  Cardiomegaly with mild left perihilar edema, increased. Moderate left pleural effusion with associated left lower lobe opacity, possibly atelectasis, unchanged.  No pneumothorax.  IMPRESSION: Interval extubation.  Cardiomegaly with  mild interstitial edema, increased.  Moderate left pleural effusion with associated left lower lobe opacity, possibly atelectasis, unchanged.   Original Report Authenticated By: Charline Bills, M.D.   Dg Chest Port 1 View  04/12/2013   *RADIOLOGY REPORT*  Clinical Data: CHF, pleural effusion  PORTABLE CHEST - 1 VIEW  Comparison: 04/11/2013  Findings: The cardiac shadow is stable.  An endotracheal tube remains in satisfactory position.  Diffuse pleural calcifications are noted on the right and stable.  A moderate-sized left-sided pleural effusion is again seen.  There is likely underlying left basilar infiltrate.  IMPRESSION: Chronic changes as described.  Persistent left pleural effusion and likely basilar infiltrate.   Original Report Authenticated By: Alcide Clever, M.D.   Dg Chest Port 1 View  04/11/2013   *RADIOLOGY REPORT*  Clinical Data: Post intubation.  PORTABLE CHEST - 1 VIEW  Comparison: 04/10/2013 and 11/13/2008.  Findings: 1629 hours.  Interval intubation.  The tip of the endotracheal tube is in the mid trachea.  There are worsening bilateral air space opacities superimposed on chronic pleural calcifications on the right.  A moderate sized left pleural effusion is similar to yesterday's examination.  No pneumothorax is identified.  The heart size and mediastinal contours are stable.  IMPRESSION:  1.  Satisfactory position of the endotracheal tube. 2.  Worsening bilateral air space opacities suspicious for edema or infection.  Similar left pleural effusion.   Original Report Authenticated By: Carey Bullocks, M.D.   Dg Chest Portable 1 View  04/11/2013   *RADIOLOGY REPORT*  Clinical Data: Left femur fracture.  PORTABLE CHEST - 1 VIEW  Comparison: Chest x-ray 11/03/2008.  Findings: Dense opacification at the base of the left hemithorax may reflect areas of atelectasis and/or consolidation, with superimposed moderate left-sided pleural effusion.  Extensive calcifications are again seen projecting  over the right hemithorax. Right apical pleuroparenchymal thickening.  Mild pulmonary venous congestion, without frank pulmonary edema.  Heart size appears borderline enlarged. The patient is rotated to the right on today's exam, resulting in distortion of the mediastinal contours and reduced diagnostic sensitivity and specificity for mediastinal pathology.  Atherosclerosis in the thoracic aorta.  IMPRESSION: 1.  New atelectasis and/or consolidation in the left lower lobe with moderate left-sided pleural effusion. 2.  Extensive pleural and parenchymal calcifications within the right hemithorax redemonstrated, potentially related to prior tuberculosis infection. 3.  Atherosclerosis.   Original Report Authenticated By: Trudie Reed, M.D.   Dg Knee Complete 4 Views Left  04/10/2013   *RADIOLOGY REPORT*  Clinical Data: Pain post fall  LEFT KNEE - COMPLETE 4+ VIEW  Comparison: None  Findings: Four views of the left knee submitted.  There is displaced fracture of distal left femur lateral femoral condyle. Moderate joint effusion.  Spurring of patella.  IMPRESSION: Displaced fracture of distal left femur lateral femoral condyle.   Original Report Authenticated By: Natasha Mead, M.D.   Dg C-arm 1-60 Min  04/11/2013   *RADIOLOGY REPORT*  Clinical Data: The distal left femur fracture.  LEFT FEMUR - 2 VIEW,DG C-ARM 1-60 MIN  Comparison: Radiographs dated 04/10/2013  Findings: AP and lateral views of the distal left femur demonstrate near anatomic alignment and position of the fracture after open reduction and internal fixation.  Side plate and multiple screws are in place.  C-arm imaging was provided during the operation.  Fluoroscopy time:  26 seconds.  IMPRESSION: Open reduction and internal fixation of the distal femur fracture.   Original Report Authenticated By: Francene Boyers, M.D.    Micro Results     Recent Results (from the past 240 hour(s))  SURGICAL PCR SCREEN     Status: None   Collection Time     04/11/13  7:50 AM      Result Value Range Status   MRSA, PCR NEGATIVE  NEGATIVE Final   Staphylococcus aureus NEGATIVE  NEGATIVE Final   Comment:            The Xpert SA Assay (FDA     approved for NASAL specimens     in patients over 39 years of age),     is one component of     a comprehensive surveillance     program.  Test performance has     been validated by [employer] for patients greater     than or equal to 69 year old.     It is not intended     to diagnose infection nor to     guide or monitor treatment.  URINE CULTURE     Status: None   Collection Time    04/11/13 11:15 PM      Result Value Range Status   Specimen Description URINE, CATHETERIZED   Final   Special Requests ADDED 2352   Final   Culture  Setup Time 04/12/2013 00:49   Final   Colony Count NO GROWTH   Final   Culture NO GROWTH   Final   Report Status 04/13/2013 FINAL   Final  URINE CULTURE     Status: None   Collection Time    04/13/13  1:31 PM      Result Value Range Status   Specimen Description URINE, CATHETERIZED   Final   Special Requests NONE   Final   Culture  Setup Time 04/13/2013 17:37   Final   Colony Count NO GROWTH   Final   Culture NO GROWTH   Final   Report Status 04/14/2013 FINAL   Final  BODY FLUID CULTURE     Status: None   Collection Time    04/13/13  3:06 PM      Result Value Range Status   Specimen Description PLEURAL FLUID LEFT   Final   Special Requests FLUID   Final   Gram Stain     Final   Value: RARE WBC PRESENT,BOTH PMN  AND MONONUCLEAR     NO ORGANISMS SEEN   Culture NO GROWTH 2 DAYS   Final   Report Status PENDING   Incomplete  AFB CULTURE WITH SMEAR     Status: None   Collection Time    04/13/13  3:06 PM      Result Value Range Status   Specimen Description PLEURAL FLUID LEFT   Final   Special Requests FLUID   Final   ACID FAST SMEAR NO ACID FAST BACILLI SEEN   Final   Culture     Final   Value: CULTURE WILL BE EXAMINED FOR 6 WEEKS BEFORE ISSUING  A FINAL REPORT   Report Status PENDING   Incomplete  URINE CULTURE     Status: None   Collection Time    04/14/13  8:17 AM      Result Value Range Status   Specimen Description URINE, CATHETERIZED   Final   Special Requests NONE   Final   Culture  Setup Time 04/14/2013 13:01   Final   Colony Count NO GROWTH   Final   Culture NO GROWTH   Final   Report Status 04/15/2013 FINAL   Final     CBC w Diff: Lab Results  Component Value Date   WBC 9.1 04/16/2013   HGB 8.0* 04/16/2013   HCT 24.5* 04/16/2013   PLT 215 04/16/2013   LYMPHOPCT 9* 04/10/2013   MONOPCT 7 04/10/2013   EOSPCT 1 04/10/2013   BASOPCT 0 04/10/2013    CMP: Lab Results  Component Value Date   NA 138 04/16/2013   K 4.5 04/16/2013   CL 103 04/16/2013   CO2 25 04/16/2013   BUN 55* 04/16/2013   CREATININE 2.28* 04/16/2013   PROT 5.8* 04/15/2013   ALBUMIN 2.4* 04/15/2013   BILITOT 0.4 04/15/2013   ALKPHOS 61 04/15/2013   AST 26 04/15/2013   ALT <5 04/15/2013  .   Discharge Instructions     Follow with Primary MD as suggested by the case manager in 3 days   Get CBC, CMP, checked 3 days by Primary MD and again as instructed by your Primary MD. Get a 2 view Chest X ray done next visit.  Get Medicines reviewed and adjusted.  Please request your Prim.MD to go over all Hospital Tests and Procedure/Radiological results at the follow up, please get all Hospital records sent to your Prim MD by signing hospital release before you go home.  Activity: No weight bearing on left leg for 6 weeks, then per Dr. supple with Full fall precautions use walker/cane & assistance as needed   Diet: Heart healthy low carbohydrate,  Fluid restriction 1.8 lit/day, Aspiration precautions.  For Heart failure patients - Check your Weight same time everyday, if you gain over 2 pounds, or you develop in leg swelling, experience more shortness of breath or chest pain, call your Primary MD immediately. Follow Cardiac Low Salt Diet and 1.8 lit/day fluid  restriction.  Disposition Home with home health  If you experience worsening of your admission symptoms, develop shortness of breath, life threatening emergency, suicidal or homicidal thoughts you must seek medical attention immediately by calling 911 or calling your MD immediately  if symptoms less severe.  You Must read complete instructions/literature along with all the possible adverse reactions/side effects for all the Medicines you take and that have been prescribed to you. Take any new Medicines after you have completely understood and accpet all the possible adverse reactions/side effects.  Do not drive and provide baby sitting services if your were admitted for syncope or siezures until you have seen by Primary MD or a Neurologist and advised to do so again.  Do not drive when taking Pain medications.    Do not take more than prescribed Pain, Sleep and Anxiety Medications  Special Instructions: If you have smoked or chewed Tobacco  in the last 2 yrs please stop smoking, stop any regular Alcohol  and or any Recreational drug use.  Wear Seat belts while driving.   Please note  You were cared for by a hospitalist during your hospital stay. If you have any questions about your discharge medications or the care you received while you were in the hospital after you are discharged, you can call the unit and asked to speak with the hospitalist on call if the hospitalist that took care of you is not available. Once you are discharged, your primary care physician will handle any further medical issues. Please note that NO REFILLS for any discharge medications will be authorized once you are discharged, as it is imperative that you return to your primary care physician (or establish a relationship with a primary care physician if you do not have one) for your aftercare needs so that they can reassess your need for medications and monitor your lab values.       Follow-up Information    Follow up with SOOD,VINEET, MD. Schedule an appointment as soon as possible for a visit in 1 week.   Contact information:   [address] [place] [address] [phone]       Follow up with Willa Rough, MD. Schedule an appointment as soon as possible for a visit in 1 week. (Or one of his partners)    Contact information:   [address] [place] [address] [phone]       Follow up with Your primary care physician. Schedule an appointment as soon as possible for a visit in 1 week. (As suggested by case manager)       Follow up with Senaida Lange, MD. Schedule an appointment as soon as possible for a visit in 3 days.   Contact information:   [address] [place] [address] [phone]         Discharge Medications     Medication List    TAKE these medications       aspirin EC 81 MG tablet  Take 81 mg by mouth daily.     CENTRUM SILVER PO  Take 1 tablet by mouth daily.     feeding supplement Liqd  Take 237 mLs by mouth 2 (two) times daily between meals.     furosemide 20 MG tablet  Commonly known as:  LASIX  Take 1 tablet (20 mg total) by mouth daily.  Start taking on:  04/20/2013     isosorbide-hydrALAZINE 20-37.5 MG per tablet  Commonly known as:  BIDIL  Take 1 tablet by mouth 2 (two) times daily.     metoprolol 50 MG tablet  Commonly known as:  LOPRESSOR  Take 1 tablet (50 mg total) by mouth 2 (two) times daily.     pantoprazole 40 MG tablet  Commonly known as:  PROTONIX  Take 1 tablet (40 mg total) by mouth daily at 12 noon.     polyethylene glycol packet  Commonly known as:  MIRALAX / GLYCOLAX  Take 17 g by mouth daily  as needed.           Total Time in preparing paper work, data evaluation and todays exam - 35 minutes  Leroy Sea M.D on 04/16/2013 at 10:18 AM  [facility] Office  [phone]

## 2013-04-16 NOTE — Clinical Social Work Note (Signed)
Patient ready for discharge home today. CSW facilitated transport home via ambulance.  Genelle Bal, MSW, LCSW [phone]

## 2013-04-16 NOTE — Progress Notes (Signed)
Pt's family at bedside in am, dc instructions given to pt and family, both verbalized understanding, pt leaving via ambulance to sons house, pt stable upon dc

## 2013-04-18 LAB — TYPE AND SCREEN
ABO/RH(D): O POS
Unit division: 0

## 2013-04-19 ENCOUNTER — Telehealth: Payer: Self-pay | Admitting: Pulmonary Disease

## 2013-04-19 NOTE — Telephone Encounter (Signed)
Per d/c note 04/16/13: Follow-up Information  Follow up with SOOD,VINEET, MD. Schedule an appointment as soon as possible for a visit in 1 week.  Contact information:  [address]  [place] [address]  [phone]   TP has nothing available next week. Please advise if okay to dbl book Dr. Craige Cotta thanks

## 2013-04-20 NOTE — Telephone Encounter (Signed)
Called, spoke with Alyssa Knight.  States pt's breathing is doing "really good."  Reports pt does have SOB at times with a lot of exertion but this does resolve after resting.  Other than this, per Alyssa Knight, pt is having no problems with breathing at this time.  States pt is receiving home health nurses through [facility].  Alyssa Knight states they have informed her pt's lungs are clear and resp rate is fine.  Dr. Evlyn Courier next available HFU isn't until July 31.  Alyssa Knight states "this is ideal" because of pt's fracture, it is difficult to get her in and out of the car at this time.  Therefore, as Alyssa Knight is ok with this date and pt's breathing is stable at this time, we have scheduled pt for a HFU on May 26, 2013 at 3:45 pm with VS.  Alyssa Knight is aware to call back if they have any questions or concerns or if pt's breathing worsens.  She verbalized understanding, is in agreement with plan, and voiced no further questions or concerns at this time.

## 2013-04-20 NOTE — Telephone Encounter (Signed)
If her respiratory status is stable, then she does not need a follow up visit that soon.  She can be scheduled for next available appointment with me.    If she is having more trouble with her breathing, then she can be scheduled with any provider who has an opening.

## 2013-05-26 ENCOUNTER — Inpatient Hospital Stay: Payer: Medicare Other | Admitting: Pulmonary Disease

## 2013-05-26 LAB — AFB CULTURE WITH SMEAR (NOT AT ARMC): Acid Fast Smear: NONE SEEN

## 2013-06-23 ENCOUNTER — Ambulatory Visit: Payer: Medicare Other | Admitting: Cardiovascular Disease

## 2013-06-25 ENCOUNTER — Encounter (HOSPITAL_COMMUNITY): Payer: Self-pay | Admitting: Physical Medicine and Rehabilitation

## 2013-06-25 ENCOUNTER — Inpatient Hospital Stay (HOSPITAL_COMMUNITY): Payer: Medicare Other

## 2013-06-25 ENCOUNTER — Emergency Department (HOSPITAL_COMMUNITY): Payer: Medicare Other

## 2013-06-25 ENCOUNTER — Inpatient Hospital Stay (HOSPITAL_COMMUNITY)
Admission: EM | Admit: 2013-06-25 | Discharge: 2013-06-27 | DRG: 291 | Disposition: A | Discharge status: Home Health | Payer: Medicare Other | Attending: Internal Medicine | Admitting: Internal Medicine

## 2013-06-25 DIAGNOSIS — I5021 Acute systolic (congestive) heart failure: Secondary | ICD-10-CM

## 2013-06-25 DIAGNOSIS — J90 Pleural effusion, not elsewhere classified: Secondary | ICD-10-CM | POA: Diagnosis present

## 2013-06-25 DIAGNOSIS — S7292XA Unspecified fracture of left femur, initial encounter for closed fracture: Secondary | ICD-10-CM

## 2013-06-25 DIAGNOSIS — I5043 Acute on chronic combined systolic (congestive) and diastolic (congestive) heart failure: Principal | ICD-10-CM | POA: Diagnosis present

## 2013-06-25 DIAGNOSIS — Z66 Do not resuscitate: Secondary | ICD-10-CM | POA: Diagnosis present

## 2013-06-25 DIAGNOSIS — I129 Hypertensive chronic kidney disease with stage 1 through stage 4 chronic kidney disease, or unspecified chronic kidney disease: Secondary | ICD-10-CM | POA: Diagnosis present

## 2013-06-25 DIAGNOSIS — J9601 Acute respiratory failure with hypoxia: Secondary | ICD-10-CM | POA: Diagnosis present

## 2013-06-25 DIAGNOSIS — J9600 Acute respiratory failure, unspecified whether with hypoxia or hypercapnia: Secondary | ICD-10-CM | POA: Diagnosis present

## 2013-06-25 DIAGNOSIS — I509 Heart failure, unspecified: Secondary | ICD-10-CM | POA: Diagnosis present

## 2013-06-25 DIAGNOSIS — N184 Chronic kidney disease, stage 4 (severe): Secondary | ICD-10-CM | POA: Diagnosis present

## 2013-06-25 DIAGNOSIS — E119 Type 2 diabetes mellitus without complications: Secondary | ICD-10-CM | POA: Diagnosis present

## 2013-06-25 DIAGNOSIS — I10 Essential (primary) hypertension: Secondary | ICD-10-CM | POA: Diagnosis present

## 2013-06-25 DIAGNOSIS — Z79899 Other long term (current) drug therapy: Secondary | ICD-10-CM

## 2013-06-25 DIAGNOSIS — I959 Hypotension, unspecified: Secondary | ICD-10-CM | POA: Diagnosis not present

## 2013-06-25 LAB — CBC WITH DIFFERENTIAL/PLATELET
Hemoglobin: 10.5 g/dL — ABNORMAL LOW (ref 12.0–15.0)
Lymphocytes Relative: 15 % (ref 12–46)
Lymphs Abs: 1 10*3/uL (ref 0.7–4.0)
Neutro Abs: 4.8 10*3/uL (ref 1.7–7.7)
Neutrophils Relative %: 75 % (ref 43–77)
Platelets: 172 10*3/uL (ref 150–400)
RBC: 3.77 MIL/uL — ABNORMAL LOW (ref 3.87–5.11)
WBC: 6.4 10*3/uL (ref 4.0–10.5)

## 2013-06-25 LAB — LACTATE DEHYDROGENASE, PLEURAL OR PERITONEAL FLUID

## 2013-06-25 LAB — COMPREHENSIVE METABOLIC PANEL
AST: 18 U/L (ref 0–37)
CO2: 25 mEq/L (ref 19–32)
Calcium: 9.4 mg/dL (ref 8.4–10.5)
Creatinine, Ser: 1.52 mg/dL — ABNORMAL HIGH (ref 0.50–1.10)
GFR calc non Af Amer: 28 mL/min — ABNORMAL LOW (ref >90)

## 2013-06-25 LAB — LACTATE DEHYDROGENASE
LDH: 254 U/L — ABNORMAL HIGH (ref 94–250)
LDH: 519 U/L — ABNORMAL HIGH (ref 94–250)

## 2013-06-25 LAB — BASIC METABOLIC PANEL
CO2: 24 mEq/L (ref 19–32)
Chloride: 104 mEq/L (ref 96–112)
GFR calc non Af Amer: 28 mL/min — ABNORMAL LOW (ref >90)
Glucose, Bld: 87 mg/dL (ref 70–99)
Potassium: 5 mEq/L (ref 3.5–5.1)
Sodium: 139 mEq/L (ref 135–145)

## 2013-06-25 LAB — GRAM STAIN

## 2013-06-25 LAB — POCT I-STAT TROPONIN I: Troponin i, poc: 0.07 ng/mL (ref 0.00–0.08)

## 2013-06-25 LAB — PROTIME-INR
INR: 1.17 (ref 0.00–1.49)
Prothrombin Time: 14.7 seconds (ref 11.6–15.2)

## 2013-06-25 LAB — BODY FLUID CELL COUNT WITH DIFFERENTIAL: Neutrophil Count, Fluid: 49 % — ABNORMAL HIGH (ref 0–25)

## 2013-06-25 LAB — TSH: TSH: 2.509 u[IU]/mL (ref 0.350–4.500)

## 2013-06-25 MED ORDER — ASPIRIN EC 81 MG PO TBEC
81.0000 mg | DELAYED_RELEASE_TABLET | Freq: Every day | ORAL | Status: DC
  Administered 2013-06-25 – 2013-06-27 (×3): 81 mg via ORAL
  Filled 2013-06-25 (×3): qty 1

## 2013-06-25 MED ORDER — GUAIFENESIN-DM 100-10 MG/5ML PO SYRP: 5.0000 mL | ORAL_SOLUTION | ORAL | Status: DC | PRN

## 2013-06-25 MED ORDER — LISINOPRIL 2.5 MG PO TABS
2.5000 mg | ORAL_TABLET | Freq: Every day | ORAL | Status: DC
  Administered 2013-06-25 – 2013-06-26 (×2): 2.5 mg via ORAL
  Filled 2013-06-25 (×2): qty 1

## 2013-06-25 MED ORDER — ONDANSETRON HCL 4 MG PO TABS: 4.0000 mg | ORAL_TABLET | Freq: Four times a day (QID) | ORAL | Status: DC | PRN

## 2013-06-25 MED ORDER — ISOSORB DINITRATE-HYDRALAZINE 20-37.5 MG PO TABS
1.0000 | ORAL_TABLET | Freq: Two times a day (BID) | ORAL | Status: DC
  Administered 2013-06-25 – 2013-06-26 (×3): 1 via ORAL
  Filled 2013-06-25 (×4): qty 1

## 2013-06-25 MED ORDER — HYDROCODONE-ACETAMINOPHEN 5-325 MG PO TABS
1.0000 | ORAL_TABLET | ORAL | Status: DC | PRN
  Administered 2013-06-25: 1 via ORAL
  Filled 2013-06-25: qty 1

## 2013-06-25 MED ORDER — HEPARIN SODIUM (PORCINE) 5000 UNIT/ML IJ SOLN
5000.0000 [IU] | Freq: Three times a day (TID) | INTRAMUSCULAR | Status: DC
  Administered 2013-06-25 – 2013-06-27 (×6): 5000 [IU] via SUBCUTANEOUS
  Filled 2013-06-25 (×9): qty 1

## 2013-06-25 MED ORDER — ALBUTEROL SULFATE (5 MG/ML) 0.5% IN NEBU: 2.5000 mg | INHALATION_SOLUTION | RESPIRATORY_TRACT | Status: DC | PRN

## 2013-06-25 MED ORDER — POLYETHYLENE GLYCOL 3350 17 G PO PACK
17.0000 g | PACK | Freq: Every day | ORAL | Status: DC | PRN
  Filled 2013-06-25: qty 1

## 2013-06-25 MED ORDER — FUROSEMIDE 10 MG/ML IJ SOLN
60.0000 mg | Freq: Once | INTRAMUSCULAR | Status: AC
  Administered 2013-06-25: 60 mg via INTRAVENOUS
  Filled 2013-06-25: qty 6

## 2013-06-25 MED ORDER — METOPROLOL TARTRATE 50 MG PO TABS
50.0000 mg | ORAL_TABLET | Freq: Two times a day (BID) | ORAL | Status: DC
  Administered 2013-06-25 – 2013-06-26 (×2): 50 mg via ORAL
  Filled 2013-06-25 (×4): qty 1

## 2013-06-25 MED ORDER — ONDANSETRON HCL 4 MG/2ML IJ SOLN: 4.0000 mg | Freq: Four times a day (QID) | INTRAMUSCULAR | Status: DC | PRN

## 2013-06-25 NOTE — ED Notes (Signed)
Alyssa Knight with CCM at bedside performing spinal tap. Pt tolerating without difficulty.

## 2013-06-25 NOTE — ED Notes (Signed)
Pt presents to department for evaluation of SOB and swelling to bilateral lower legs. History of CHF, takes Lasix at home. Pt states she can't lay flat at night to sleep. Denies chest pain. Respirations unlabored. She is alert and oriented x4.

## 2013-06-25 NOTE — Progress Notes (Signed)
Patient heart rate ranging from 40-70.  Mostly in mid 50. Order given to hold 2200 Metoprolol dose by Daphane Shepherd.

## 2013-06-25 NOTE — Procedures (Signed)
Thoracentesis Procedure Note  Pre-operative Diagnosis: pleural effusion    Post-operative Diagnosis: same  Indications: dyspnea     Procedure Details  Consent: Informed consent was obtained. Risks of the procedure were discussed including: infection, bleeding, pain, pneumothorax.  Under sterile conditions the patient was positioned. Betadine solution and sterile drapes were utilized.  1% buffered lidocaine was used to anesthetize the rib space which was identified via real time [place]. Fluid was obtained without any difficulties and minimal blood loss.  A dressing was applied to the wound and wound care instructions were provided.   Findings 1500 ml of clear pleural fluid was obtained. A sample was sent to Pathology for cytogenetics, flow, and cell counts, as well as for infection analysis.  Complications:  None; patient tolerated the procedure well.          Condition: stable  Plan A follow up chest x-ray was ordered. Bed Rest for 0 hours. Tylenol 650 mg. for pain.  Supervised and was present through the entire procedure.  Lonia Farber, MD Pulmonary and Critical Care Medicine [facility] Pager: [phone]

## 2013-06-25 NOTE — ED Provider Notes (Signed)
CSN: [id number]     Arrival date & time 06/25/13  4098 History   First MD Initiated Contact with Patient 06/25/13 [phone]     Chief Complaint  Patient presents with  . Shortness of Breath  . Leg Swelling    HPI  PCP Dr. Manus Gunning.  [facility]. Presents with a week's worth of worsening orthopnea. A few days prior that noted that her legs have become more swollen. She's had swelling in her legs in the past. She states this may be the worse it has been.  She has a history of CHF.  Most recently is 35%. She is on daily Lasix. She states she is continued on the same dose. No changes. Has not missed doses. She denies pain anywhere, no chest pain, no leg pain. She has orthopnea. She has a slight decrease of her exertional capacity however she is still able to get around her home.   Past Medical History  Diagnosis Date  . Diabetes mellitus without complication   . HTN (hypertension)   . CKD (chronic kidney disease)   . Systolic CHF    Past Surgical History  Procedure Laterality Date  . Femur fracture surgery Left 03/2013    L distal femur lateral condyle s/p ORIF  . Orif femur fracture Left 04/11/2013    Procedure: OPEN REDUCTION INTERNAL FIXATION (ORIF) DISTAL FEMUR FRACTURE- left;  Surgeon: Senaida Lange, MD;  Location: [facility] OR;  Service: Orthopedics;  Laterality: Left;   No family history on file. History  Substance Use Topics  . Smoking status: Never Smoker   . Smokeless tobacco: Not on file  . Alcohol Use: Yes   OB History   Grav Para Term Preterm Abortions TAB SAB Ect Mult Living                 Review of Systems  Constitutional: Negative for fever, chills, diaphoresis, appetite change and fatigue.  HENT: Negative for sore throat, mouth sores and trouble swallowing.   Eyes: Negative for visual disturbance.  Respiratory: Positive for shortness of breath. Negative for cough, chest tightness and wheezing.        Orthopnea  Cardiovascular: Positive for leg swelling. Negative for  chest pain.  Gastrointestinal: Negative for nausea, vomiting, abdominal pain, diarrhea and abdominal distention.  Endocrine: Negative for polydipsia, polyphagia and polyuria.  Genitourinary: Negative for dysuria, frequency and hematuria.  Musculoskeletal: Negative for gait problem.  Skin: Negative for color change, pallor and rash.  Neurological: Negative for dizziness, syncope, light-headedness and headaches.  Hematological: Does not bruise/bleed easily.  Psychiatric/Behavioral: Negative for behavioral problems and confusion.    Allergies  Actos  Home Medications   Current Outpatient Rx  Name  Route  Sig  Dispense  Refill  . acetaminophen (TYLENOL) 500 MG tablet   Oral   Take 500 mg by mouth every 6 (six) hours as needed for pain (for pain in leg).         Marland Kitchen aspirin EC 81 MG tablet   Oral   Take 81 mg by mouth daily.         . furosemide (LASIX) 20 MG tablet   Oral   Take 20 mg by mouth.         . isosorbide-hydrALAZINE (BIDIL) 20-37.5 MG per tablet   Oral   Take 1 tablet by mouth 2 (two) times daily.         . metoprolol (LOPRESSOR) 50 MG tablet   Oral   Take 50 mg by  mouth 2 (two) times daily.         . Multiple Vitamins-Minerals (CENTRUM SILVER PO)   Oral   Take 1 tablet by mouth daily.          BP 125/69  Pulse 77  Temp(Src) 97.4 F (36.3 C) (Oral)  Resp 13  SpO2 97% Physical Exam  Constitutional: She is oriented to person, place, and time. She appears well-developed and well-nourished. No distress.  Awake alert oriented lucid and conversant  HENT:  Head: Normocephalic.  Eyes: Conjunctivae are normal. Pupils are equal, round, and reactive to light. No scleral icterus.  Neck: Normal range of motion. Neck supple. No thyromegaly present.  No JVD  Cardiovascular: Normal rate and regular rhythm.  Exam reveals no gallop and no friction rub.   No murmur heard. No S3 or 4 gallop  Pulmonary/Chest: Effort normal and breath sounds normal. No  respiratory distress. She has no wheezes. She has no rales.  Decreased left basilar breath sounds  Abdominal: Soft. Bowel sounds are normal. She exhibits no distension. There is no tenderness. There is no rebound.  Musculoskeletal: Normal range of motion.  Neurological: She is alert and oriented to person, place, and time.  Skin: Skin is warm and dry. No rash noted.  1-2+ symmetric bilateral edema to the knee. No cording no redness or erythema or warmth  Psychiatric: She has a normal mood and affect. Her behavior is normal.    ED Course  Procedures (including critical care time)  EKG: Indication shortness of breath interpretation sinus rhythm with occasional PVCs no acute ST or T wave changes. No changes comparison 6/14.  Labs Review Labs Reviewed  CBC WITH DIFFERENTIAL - Abnormal; Notable for the following:    RBC 3.77 (*)    Hemoglobin 10.5 (*)    HCT 32.7 (*)    RDW 17.1 (*)    All other components within normal limits  BASIC METABOLIC PANEL - Abnormal; Notable for the following:    BUN 28 (*)    Creatinine, Ser 1.52 (*)    GFR calc non Af Amer 28 (*)    GFR calc Af Amer 33 (*)    All other components within normal limits  PRO B NATRIURETIC PEPTIDE - Abnormal; Notable for the following:    Pro B Natriuretic peptide (BNP) 17428.0 (*)    All other components within normal limits  PROTIME-INR  LACTATE DEHYDROGENASE  COMPREHENSIVE METABOLIC PANEL  POCT I-STAT TROPONIN I   Imaging Review Dg Chest 2 View  06/25/2013   *RADIOLOGY REPORT*  Clinical Data: Shortness of breath  CHEST - 2 VIEW  Comparison: 04/14/2013  Findings: Chronic changes are noted on the right.  A large left- sided pleural effusion is identified.  This is new from prior exam. The cardiac shadow is mildly enlarged.  IMPRESSION: Enlarging left-sided pleural effusion.  Chronic changes on the right.   Original Report Authenticated By: Alcide Clever, M.D.    MDM   1. Pleural effusion, left   2. Acute respiratory  failure with hypoxia   3. Chronic renal insufficiency, stage III (moderate)   4. Congestive heart failure   5. Diabetes type 2, controlled   6. HTN (hypertension)    discuss findings with the patient that we could discuss the findings and the patient was hospitalist. Shows marked chronic scarring of her right lung she is a new greater than 50% left hemithorax pleural effusion. She is in congestive heart failure. Plan is admission. Consideration for thoracentesis. Diuresis.  Claudean Kinds, MD 06/25/13 [id number]

## 2013-06-25 NOTE — H&P (Signed)
[facility]                                                                                    Patient Demographics  Alyssa Knight, is a 79 y.o. female  CSN: [id number]  MRN: [id number]  DOB - Jun 12, 1917  Admit Date - 06/25/2013  Outpatient Primary MD for the patient is No primary provider on file.   With History of -  Past Medical History  Diagnosis Date  . Diabetes mellitus without complication   . HTN (hypertension)   . CKD (chronic kidney disease)   . Systolic CHF       Past Surgical History  Procedure Laterality Date  . Femur fracture surgery Left 03/2013    L distal femur lateral condyle s/p ORIF  . Orif femur fracture Left 04/11/2013    Procedure: OPEN REDUCTION INTERNAL FIXATION (ORIF) DISTAL FEMUR FRACTURE- left;  Surgeon: Senaida Lange, MD;  Location: [facility] OR;  Service: Orthopedics;  Laterality: Left;    in for   Chief Complaint  Patient presents with  . Shortness of Breath  . Leg Swelling     HPI  Alyssa Knight  is a 79 y.o. female, With history of chronic combined systolic and diastolic heart failure last EF 35% few months ago,Chronic kidney disease stage IV baseline creatinine between 1.5 and 1.7, hypertension, diabetes mellitus and diet control, recent fall with left femoral fracture status post open reduction internal fixation a few months ago she has recovered well from the surgery, history of left-sided pleural effusion status post thoracentesis few months ago by pulmonary critical care at this facility follows with Dr.Sood, presents to the hospital with 4-5 day history of progressive shortness of breath which is mostly exertional along with orthopnea and increased swelling in both legs, denies any chest pain or palpitations, mild dry cough no fever chills, no abdominal pain or diarrhea no blood in stool or urine, presented to the ER where a workup was consistent with acute respiratory failure secondary to large left-sided pleural effusion consistent with  acute on chronic combined heart failure.  Her initial lab work, EKG including troponin were all stable. I was called to admit the patient.    Review of Systems    In addition to the HPI above,   No Fever-chills, No Headache, No changes with Vision or hearing, No problems swallowing food or Liquids, No Chest pain, Cough , +ve excertional Shortness of Breath and Orthopnea No Abdominal pain, No Nausea or Vommitting, Bowel movements are regular, No Blood in stool or Urine, No dysuria, No new skin rashes or bruises, + leg swelling No new joints pains-aches,  No new weakness, tingling, numbness in any extremity, No recent weight gain or loss, No polyuria, polydypsia or polyphagia, No significant Mental Stressors.  A full 10 point Review of Systems was done, except as stated above, all other Review of Systems were negative.   Social History History  Substance Use Topics  . Smoking status: Never Smoker   . Smokeless tobacco: Not on file  . Alcohol Use: Yes      Family History NO CAD at early age  Prior to Admission medications  Medication Sig Start Date End Date Taking? Authorizing Provider  acetaminophen (TYLENOL) 500 MG tablet Take 500 mg by mouth every 6 (six) hours as needed for pain (for pain in leg).   Yes Historical Provider, MD  aspirin EC 81 MG tablet Take 81 mg by mouth daily.   Yes Historical Provider, MD  furosemide (LASIX) 20 MG tablet Take 20 mg by mouth.   Yes Historical Provider, MD  isosorbide-hydrALAZINE (BIDIL) 20-37.5 MG per tablet Take 1 tablet by mouth 2 (two) times daily.   Yes Historical Provider, MD  metoprolol (LOPRESSOR) 50 MG tablet Take 50 mg by mouth 2 (two) times daily.   Yes Historical Provider, MD  Multiple Vitamins-Minerals (CENTRUM SILVER PO) Take 1 tablet by mouth daily.   Yes Historical Provider, MD    Allergies  Allergen Reactions  . Actos [Pioglitazone] Other (See Comments)    May have dropped blood sugar too much-family unsure     Physical Exam  Vitals  Blood pressure 125/69, pulse 77, temperature 97.4 F (36.3 C), temperature source Oral, resp. rate 13, SpO2 97.00%.   1. General frail elderly AA female lying in bed in NAD,     2. Normal affect and insight, Not Suicidal or Homicidal, Awake Alert, Oriented X 3.  3. No F.N deficits, ALL C.Nerves Intact, Strength 5/5 all 4 extremities, Sensation intact all 4 extremities, Plantars down going.  4. Ears and Eyes appear Normal, Conjunctivae clear, PERRLA. Moist Oral Mucosa.  5. Supple Neck, ++ JVD, No cervical lymphadenopathy appriciated, No Carotid Bruits.  6. Symmetrical Chest wall movement, reduced LLL Breath sounds , bibasilar rales L>R  7. RRR, No Gallops, Rubs or Murmurs, No Parasternal Heave.  8. Positive Bowel Sounds, Abdomen Soft, Non tender, No organomegaly appriciated,No rebound -guarding or rigidity.  9.  No Cyanosis, Normal Skin Turgor, No Skin Rash or Bruise.  10. Good muscle tone,  joints appear normal , no effusions, Normal ROM.  11. No Palpable Lymph Nodes in Neck or Axillae     Data Review  CBC  Recent Labs Lab 06/25/13 0910  WBC 6.4  HGB 10.5*  HCT 32.7*  PLT 172  MCV 86.7  MCH 27.9  MCHC 32.1  RDW 17.1*  LYMPHSABS 1.0  MONOABS 0.5  EOSABS 0.1  BASOSABS 0.0   ------------------------------------------------------------------------------------------------------------------  Chemistries   Recent Labs Lab 06/25/13 0910  NA 139  K 5.0  CL 104  CO2 24  GLUCOSE 87  BUN 28*  CREATININE 1.52*  CALCIUM 9.3   ------------------------------------------------------------------------------------------------------------------ CrCl is unknown because both a height and weight (above a minimum accepted value) are required for this calculation. ------------------------------------------------------------------------------------------------------------------ No results found for this basename: TSH, T4TOTAL, FREET3, T3FREE,  THYROIDAB,  in the last 72 hours   Coagulation profile No results found for this basename: INR, PROTIME,  in the last 168 hours ------------------------------------------------------------------------------------------------------------------- No results found for this basename: DDIMER,  in the last 72 hours -------------------------------------------------------------------------------------------------------------------  Cardiac Enzymes No results found for this basename: CK, CKMB, TROPONINI, MYOGLOBIN,  in the last 168 hours ------------------------------------------------------------------------------------------------------------------ No components found with this basename: POCBNP,    ---------------------------------------------------------------------------------------------------------------  Urinalysis    Component Value Date/Time   COLORURINE AMBER* 04/14/2013 0817   APPEARANCEUR CLOUDY* 04/14/2013 0817   LABSPEC 1.019 04/14/2013 0817   PHURINE 5.0 04/14/2013 0817   GLUCOSEU NEGATIVE 04/14/2013 0817   HGBUR SMALL* 04/14/2013 0817   BILIRUBINUR NEGATIVE 04/14/2013 0817   KETONESUR NEGATIVE 04/14/2013 0817   PROTEINUR NEGATIVE 04/14/2013 0817   UROBILINOGEN 0.2 04/14/2013  1610   NITRITE NEGATIVE 04/14/2013 0817   LEUKOCYTESUR SMALL* 04/14/2013 0817    ----------------------------------------------------------------------------------------------------------------  Imaging results:   Dg Chest 2 View  06/25/2013   *RADIOLOGY REPORT*  Clinical Data: Shortness of breath  CHEST - 2 VIEW  Comparison: 04/14/2013  Findings: Chronic changes are noted on the right.  A large left- sided pleural effusion is identified.  This is new from prior exam. The cardiac shadow is mildly enlarged.  IMPRESSION: Enlarging left-sided pleural effusion.  Chronic changes on the right.   Original Report Authenticated By: Alcide Clever, M.D.    My personal review of EKG: Rhythm NSR,  no Acute ST  changes    Assessment & Plan   1.Acute respiratory failure secondary to reoccurrence of left sided pleural effusion. This likely is brought on by acute on chronic combined systolic and diastolic heart failure, she underwent left-sided thoracentesis by pulmonary critical care a few months ago in this hospital, currently I will admit her to a telemetry bed, salt fluid restriction, IV Lasix for diuresis, have consulted pulmonary critical care to see if they would consider another thoracentesis.    2. Acute on chronic combined systolic and diastolic heart failure. A few months ago was 35%, telemetry monitoring, fluid salt restriction, IV Lasix, elevate head of the bed, continue long acting hydralazine and nitrate, and low-dose ACE inhibitor, continue home dose beta blocker.    3. Chronic disease stage IV. Creatinine around baseline of 1.5-1.7, with diuresis and addition of low-dose ACE inhibitor we'll monitor renal function closely.    4. Recent mechanical fall with left femoral fracture. Status post open reduction internal fixation a few months ago, she is recovering well from this injury, activity as tolerated under the guidance of physical therapy.    5. Mention of type 2 diabetes mellitus in the chart. This is controlled by diet, will check A1c and monitor.    DVT Prophylaxis Heparin    AM Labs Ordered, also please review Full Orders  Family Communication: Admission, patients condition and plan of care including tests being ordered have been discussed with the patient and son who indicate understanding and agree with the plan and Code Status.  Code Status DNR  Likely DC to  Home  Time spent in minutes : 35  Condition Marinell Blight K M.D on 06/25/2013 at 10:25 AM  Between 7am to 7pm - Pager - [phone]  After 7pm go to [email or web address] - password [email or web address]  And look for the night coverage person covering me after hours  [facility] Office   [phone]

## 2013-06-25 NOTE — ED Notes (Signed)
Fluids labeled and sent down to lab.

## 2013-06-26 DIAGNOSIS — I5021 Acute systolic (congestive) heart failure: Secondary | ICD-10-CM

## 2013-06-26 LAB — CBC
HCT: 31.3 % — ABNORMAL LOW (ref 36.0–46.0)
Hemoglobin: 10.1 g/dL — ABNORMAL LOW (ref 12.0–15.0)
MCH: 27.7 pg (ref 26.0–34.0)
MCV: 85.8 fL (ref 78.0–100.0)
RBC: 3.65 MIL/uL — ABNORMAL LOW (ref 3.87–5.11)

## 2013-06-26 LAB — BASIC METABOLIC PANEL
CO2: 26 mEq/L (ref 19–32)
Glucose, Bld: 81 mg/dL (ref 70–99)
Potassium: 4.3 mEq/L (ref 3.5–5.1)
Sodium: 139 mEq/L (ref 135–145)

## 2013-06-26 MED ORDER — SODIUM CHLORIDE 0.9 % IV BOLUS (SEPSIS)
250.0000 mL | Freq: Once | INTRAVENOUS | Status: AC
  Administered 2013-06-26: 15:00:00 250 mL via INTRAVENOUS

## 2013-06-26 MED ORDER — FUROSEMIDE 20 MG PO TABS
20.0000 mg | ORAL_TABLET | Freq: Every day | ORAL | Status: DC
  Administered 2013-06-26 – 2013-06-27 (×2): 20 mg via ORAL
  Filled 2013-06-26 (×2): qty 1

## 2013-06-26 NOTE — Evaluation (Signed)
Physical Therapy Evaluation Patient Details Name: Alyssa Knight MRN: [id number] DOB: 11-30-1916 Today's Date: 06/26/2013 Time: 4098-1191 PT Time Calculation (min): 20 min  PT Assessment / Plan / Recommendation History of Present Illness  79 y.o. female admitted with SOB with pleural effusion now s/p thoracentesis removal of 1500cc.  S/p fall with left femur fracture and ORIF few months ago.  Clinical Impression  Patient presents close to functional baseline where she has assist for ambulation at home with walker.  Feel no follow up PT needs, but will see acutely to maximize independence and safety prior to d/c home with family assist.    PT Assessment  Patient needs continued PT services    Follow Up Recommendations  No PT follow up;Supervision/Assistance - 24 hour    Does the patient have the potential to tolerate intense rehabilitation    N/A  Barriers to Discharge  None      Equipment Recommendations  None recommended by PT    Recommendations for Other Services   None  Frequency Min 3X/week    Precautions / Restrictions Precautions Precautions: Fall Precaution Comments: h/o fall previous to left femoral fracture few months ago; now walks with assist in the home, has not had falls since; pt with posterior bias   Pertinent Vitals/Pain Denies pain; SpO2 94% ambulating in hall on room air      Mobility  Bed Mobility Bed Mobility: Not assessed Details for Bed Mobility Assistance: pt in recliner Transfers Transfers: Sit to Stand;Stand to Sit Sit to Stand: 4: Min guard;From chair/3-in-1;With upper extremity assist Stand to Sit: 4: Min assist;To chair/3-in-1;With upper extremity assist Details for Transfer Assistance: cues for hand placement; assist to lower safely in chair (tends to sit with uncontrolled descent) Ambulation/Gait Ambulation/Gait Assistance: 4: Min guard Ambulation Distance (Feet): 105 Feet Assistive device: Rolling walker Ambulation/Gait Assistance  Details: posterior bias, stopped to check SpO2 in hallway (was 94%) noted posterior loss of balance Gait Pattern: Step-through pattern;Shuffle;Trunk flexed    Exercises     PT Diagnosis: Generalized weakness;Abnormality of gait  PT Problem List: Decreased balance;Decreased activity tolerance;Decreased mobility;Decreased safety awareness PT Treatment Interventions: DME instruction;Gait training;Stair training;Functional mobility training;Therapeutic activities;Patient/family education;Therapeutic exercise;Balance training     PT Goals(Current goals can be found in the care plan section) Acute Rehab PT Goals Patient Stated Goal: To go home with family help PT Goal Formulation: With patient Time For Goal Achievement: 07/10/13 Potential to Achieve Goals: Good  Visit Information  Last PT Received On: 06/26/13 Assistance Needed: +1 History of Present Illness: 79 y.o. female admitted with SOB with pleural effusion now s/p thoracentesis removal of 1500cc.  S/p fall with left femur fracture and ORIF few months ago.       Prior Functioning  Home Living Family/patient expects to be discharged to:: Private residence Living Arrangements: Children Available Help at Discharge: Family;Available 24 hours/day Type of Home: House Home Access: Stairs to enter Entrance Stairs-Rails: Right Home Layout: One level Home Equipment: Walker - 2 wheels;Shower seat;Bedside commode;Wheelchair - manual Prior Function Level of Independence: Needs assistance Gait / Transfers Assistance Needed: assist with walker for in home ambulation, uses wheelchair for outings Communication Communication: No difficulties Dominant Hand: Right    Cognition  Cognition Arousal/Alertness: Awake/alert Behavior During Therapy: WFL for tasks assessed/performed Overall Cognitive Status: Within Functional Limits for tasks assessed (did not recall day, knew month/year)    Extremity/Trunk Assessment Lower Extremity  Assessment Lower Extremity Assessment: RLE deficits/detail;LLE deficits/detail RLE Deficits / Details: AROM WFL, strength at  least 3/5 LLE Deficits / Details: AROM decreased ankle DF, strength at least 3/5 except ankle DF; has edema bil LE's Cervical / Trunk Assessment Cervical / Trunk Assessment: Kyphotic   Balance Balance Balance Assessed: Yes Static Standing Balance Static Standing - Balance Support: Bilateral upper extremity supported Static Standing - Level of Assistance: 4: Min assist  End of Session PT - End of Session Equipment Utilized During Treatment: Gait belt Activity Tolerance: Patient limited by fatigue Patient left: in chair;with call bell/phone within reach  GP     [facility] 06/26/2013, 1:10 PM [place], PT [phone] 06/26/2013

## 2013-06-26 NOTE — Progress Notes (Signed)
[facility] PROGRESS NOTE  Alyssa Knight:[id number] DOB: 1917/03/10 DOA: 06/25/2013 PCP: No primary provider on file.  Assessment/Plan: Left-sided pleural effusion   Patient with history of congestive heart failure ejection fraction of 35%, undergoing thoracentesis yesterday with removal of 1500 mL's. Pleural fluid culture showing no organisms. Patient undergoing previous workup for pleural effusion, felt to be secondary to heart failure. She had a left sided pleural effusion drained in June of 2014, with fluid analysis suggestive of CHF.  Will maximize diuretic therapy. Followup on cultures.  Acute on chronic systolic and diastolic heart failure Patient with a known ejection fraction of 35%,   having a urinary output of 922 ml's in the last 24 hours.  Will continue diuretic therapy, transition to oral Lasix.      Hypotension Likely precipitate by antihypertensive agents administered today.  Systolic blood pressure pressures reaching as low as the 70s, improved to 102 after the receiving a bolus of normal saline.  I am discontinuing lisinopril metoprolol. Will need to monitor closely with diuretic therapy   Stage III chronic kidney disease Patient having a downward trend in her creatinine from 1.5 to 1.7. I believe this still may be within her baseline range. Follow up on AM lab work as she is on diuretic therapy.                                   Code Status: DO NOT RESUSCITATE Family Communication: Plan discussed with patient Disposition Plan: Monitor patient over the next 24 hours, followup on fluid analysis.   Consultants:  Pulmonary/critical care  Procedures:  Thoracentesis performed on 06/25/2013 with removal of 1500 mL of pleural fluid   HPI/Subjective: Alyssa Knight is a pleasant 79 year old female with a past medical history of congestive heart failure, admitted yesterday, presenting to the department with worsening shortness of breath and cough. Chest x-ray  showed a large left-sided pleural effusion, undergoing thoracentesis as 1500 mL of clear fluid was drained. Today she reports feeling much better. In the afternoon however, she developed hypotension after receiving her antihypertensive agents. She was bolused with 250 mL's of normal saline with subsequent improvement to blood pressures. Most recent blood pressure 107/50.  Objective: Filed Vitals:   06/26/13 1420  BP: 107/50  Pulse: 62  Temp:   Resp:     Intake/Output Summary (Last 24 hours) at 06/26/13 1446 Last data filed at 06/26/13 1300  Gross per 24 hour  Intake    500 ml  Output    951 ml  Net   -451 ml   Filed Weights   06/25/13 1258 06/25/13 1308 06/26/13 0419  Weight: 65.8 kg (145 lb 1 oz) 63.6 kg (140 lb 3.4 oz) 65.59 kg (144 lb 9.6 oz)    Exam:   General:  Patient is in no acute distress she is sitting comfortably.  Cardiovascular: Regular rate rhythm normal S1-S2, she has 2+ bilateral extremity pitting edema  Respiratory: Normal respiratory effort, clear lung sounds bilaterally.  Abdomen: Abdomen is soft nontender as are positive bowel sounds  Musculoskeletal: Present range of motion of all extremities   Data Reviewed: Basic Metabolic Panel:  Recent Labs Lab 06/25/13 0910 06/25/13 1043 06/26/13 0400  NA 139 141 139  K 5.0 5.1 4.3  CL 104 105 103  CO2 24 25 26   GLUCOSE 87 82 81  BUN 28* 28* 33*  CREATININE 1.52* 1.52* 1.70*  CALCIUM 9.3  9.4 8.9   Liver Function Tests:  Recent Labs Lab 06/25/13 1043 06/25/13 1126  AST 18  --   ALT <5  --   ALKPHOS 57  --   BILITOT 0.3  --   PROT 7.3 7.4  ALBUMIN 3.3*  --    No results found for this basename: LIPASE, AMYLASE,  in the last 168 hours No results found for this basename: AMMONIA,  in the last 168 hours CBC:  Recent Labs Lab 06/25/13 0910 06/26/13 0400  WBC 6.4 9.0  NEUTROABS 4.8  --   HGB 10.5* 10.1*  HCT 32.7* 31.3*  MCV 86.7 85.8  PLT 172 166   Cardiac Enzymes: No results found  for this basename: CKTOTAL, CKMB, CKMBINDEX, TROPONINI,  in the last 168 hours BNP (last 3 results)  Recent Labs  04/12/13 0320 06/25/13 0914  PROBNP 10720.0* 17428.0*   CBG: No results found for this basename: GLUCAP,  in the last 168 hours  Recent Results (from the past 240 hour(s))  GRAM STAIN     Status: None   Collection Time    06/25/13 11:30 AM      Result Value Range Status   Specimen Description PLEURAL LEFT FLUID   Final   Special Requests NONE   Final   Gram Stain     Final   Value: CYTOSPIN SLIDE     WBC PRESENT,BOTH PMN AND MONONUCLEAR     NO ORGANISMS SEEN   Report Status 06/25/2013 FINAL   Final  BODY FLUID CULTURE     Status: None   Collection Time    06/25/13 11:30 AM      Result Value Range Status   Specimen Description PLEURAL LEFT FLUID   Final   Special Requests NONE   Final   Gram Stain     Final   Value: CYTOSPIN WBC PRESENT,BOTH PMN AND MONONUCLEAR     NO ORGANISMS SEEN     Performed at [facility]     Performed at [facility]   Culture PENDING   Incomplete   Report Status PENDING   Incomplete     Studies: Dg Chest 2 View  06/25/2013   *RADIOLOGY REPORT*  Clinical Data: Shortness of breath  CHEST - 2 VIEW  Comparison: 04/14/2013  Findings: Chronic changes are noted on the right.  A large left- sided pleural effusion is identified.  This is new from prior exam. The cardiac shadow is mildly enlarged.  IMPRESSION: Enlarging left-sided pleural effusion.  Chronic changes on the right.   Original Report Authenticated By: Alcide Clever, M.D.   Dg Chest Port 1 View  06/25/2013   *RADIOLOGY REPORT*  Clinical Data: Status post thoracentesis.  PORTABLE CHEST - 1 VIEW  Comparison: Chest x-ray 06/25/2013.  Findings: Compared to the prior study, the previously noted large left pleural effusion has significantly decreased in size, now only a small amount of residual pleural fluid remains.  No definite postprocedural pneumothorax identified.  There  has been significant re-expansion of the left lung, with minimal residual subsegmental atelectasis in the left base.  Gross architectural distortion and multifocal pleural parenchymal calcification throughout the right hemithorax, similar to numerous prior examinations, compatible with chronic fibrothorax. Heart size is mildly enlarged. The patient is rotated to the right on today's exam, resulting in distortion of the mediastinal contours and reduced diagnostic sensitivity and specificity for mediastinal pathology.  Atherosclerosis in the thoracic aorta.  A small calcified granuloma in the left apex is unchanged.  IMPRESSION: 1.  Near complete drainage of the left pleural effusion from recent thoracentesis with significantly improved aeration throughout the left lung base.  The appearance of the chest is otherwise essentially unchanged, as above. 2.  No evidence of postprocedural pneumothorax.   Original Report Authenticated By: Trudie Reed, M.D.    Scheduled Meds: . aspirin EC  81 mg Oral Daily  . heparin  5,000 Units Subcutaneous Q8H  . isosorbide-hydrALAZINE  1 tablet Oral BID  . sodium chloride  250 mL Intravenous Once   Continuous Infusions:   Principal Problem:   Pleural effusion, left Active Problems:   HTN (hypertension)   Congestive heart failure   Femur fracture, left   Chronic renal insufficiency, stage III (moderate)   Diabetes type 2, controlled   Acute respiratory failure with hypoxia    Time spent: 35    Jeralyn Bennett  [facility] Pager [phone]. If 7PM-7AM, please contact night-coverage at [email or web address], password [facility] 06/26/2013, 2:46 PM  LOS: 1 day

## 2013-06-26 NOTE — Significant Event (Signed)
Dr Vanessa Barbara called Patient B/P at 74/29 hr 54 ,bolus 250 ml  NS IV given, post  Bolus B/P 107/50 hr 62. Patient states is feeling better . Will continue to monitor patient.

## 2013-06-27 LAB — CBC
MCH: 27.7 pg (ref 26.0–34.0)
MCHC: 32.1 g/dL (ref 30.0–36.0)
MCV: 86.3 fL (ref 78.0–100.0)
Platelets: 145 10*3/uL — ABNORMAL LOW (ref 150–400)
RBC: 3.43 MIL/uL — ABNORMAL LOW (ref 3.87–5.11)
RDW: 17.1 % — ABNORMAL HIGH (ref 11.5–15.5)

## 2013-06-27 LAB — BASIC METABOLIC PANEL
BUN: 35 mg/dL — ABNORMAL HIGH (ref 6–23)
CO2: 28 mEq/L (ref 19–32)
Calcium: 8.6 mg/dL (ref 8.4–10.5)
Creatinine, Ser: 1.87 mg/dL — ABNORMAL HIGH (ref 0.50–1.10)
Glucose, Bld: 89 mg/dL (ref 70–99)

## 2013-06-27 NOTE — Progress Notes (Signed)
Physical Therapy Treatment Patient Details Name: Alyssa Knight MRN: [id number] DOB: 18-Jun-1917 Today's Date: 06/27/2013 Time: 9562-1308 PT Time Calculation (min): 12 min  PT Assessment / Plan / Recommendation  History of Present Illness 79 y.o. female admitted with SOB with pleural effusion now s/p thoracentesis removal of 1500cc.  S/p fall with left femur fracture and ORIF few months ago.   PT Comments   Pt very pleasant & willing to participate in therapy session.  Moves well.     Follow Up Recommendations  No PT follow up;Supervision/Assistance - 24 hour     Does the patient have the potential to tolerate intense rehabilitation     Barriers to Discharge        Equipment Recommendations  None recommended by PT    Recommendations for Other Services    Frequency Min 3X/week   Progress towards PT Goals Progress towards PT goals: Progressing toward goals  Plan Current plan remains appropriate    Precautions / Restrictions Precautions Precautions: Fall Precaution Comments: h/o fall previous to left femoral fracture few months ago; now walks with assist in the home, has not had falls since; pt with posterior bias   Pertinent Vitals/Pain No pain reported.     Mobility  Bed Mobility Bed Mobility: Not assessed Transfers Transfers: Sit to Stand;Stand to Sit Sit to Stand: 4: Min guard;With upper extremity assist;With armrests;From chair/3-in-1 Stand to Sit: 4: Min guard;With upper extremity assist;With armrests;To chair/3-in-1 Details for Transfer Assistance: cues for hand placement & use of UE's to control descent with stand>sit Ambulation/Gait Ambulation/Gait Assistance: 4: Min guard Ambulation Distance (Feet): 120 Feet Assistive device: Rolling walker Ambulation/Gait Assistance Details: cues for tall posture due to fwd flexed posture but no posterior bias noted today Gait Pattern: Step-through pattern;Trunk flexed Stairs: No Wheelchair Mobility Wheelchair Mobility: No     Exercises General Exercises - Lower Extremity Long Arc Quad: AROM;Strengthening;Both;10 reps Hip Flexion/Marching: AROM;Strengthening;Both;10 reps Toe Raises: AROM;Both;10 reps Heel Raises: AROM;Both;10 reps       PT Goals (current goals can now be found in the care plan section) Acute Rehab PT Goals Patient Stated Goal: To go home with family help PT Goal Formulation: With patient Time For Goal Achievement: 07/10/13 Potential to Achieve Goals: Good  Visit Information  Last PT Received On: 06/27/13 Assistance Needed: +1 History of Present Illness: 79 y.o. female admitted with SOB with pleural effusion now s/p thoracentesis removal of 1500cc.  S/p fall with left femur fracture and ORIF few months ago.    Subjective Data  Patient Stated Goal: To go home with family help   Cognition  Cognition Arousal/Alertness: Awake/alert Behavior During Therapy: WFL for tasks assessed/performed Overall Cognitive Status: Within Functional Limits for tasks assessed    Balance     End of Session PT - End of Session Equipment Utilized During Treatment: Gait belt Activity Tolerance: Patient tolerated treatment well Patient left: in chair;with call bell/phone within reach   GP     Lara Mulch 06/27/2013, 8:26 AM   Verdell Face, PTA [phone] 06/27/2013

## 2013-06-27 NOTE — Progress Notes (Addendum)
Noted consult to CM stating that pt could have PT/OT and a CHF visit if needed. Pt has already d/c and I didn't receive call prior to that so I called number listed in EPIC and spoke with patient's son.  They stated that they had previously had [facility] and they had just signed off within the last 1-2 weeks.  They wished to remain with [facility].  Advised them that I would make these arrangements for HHRN/PT. Pt's son stated understanding. Confirmed that there has been no change in address or phone.  Son did request that the mobile number listed, his number, be the primary number used.

## 2013-06-27 NOTE — Discharge Summary (Signed)
Physician Discharge Summary  ETHELYNE ERICH BJY:[id number] DOB: 1917/06/16 DOA: 06/25/2013  PCP: No primary provider on file. Dr Blair Heys  Admit date: 06/25/2013 Discharge date: 06/27/2013  Time spent: 40 minutes  Recommendations for Outpatient Follow-up:  1. Please followup on a BMP, patient diuresed with Lasix during this hospitalization with her creatinine at 1.87 06/27/2013. I've recommended close followup to see her primary care provider this Thursday for lab work.  Discharge Diagnoses:  Principal Problem:   Pleural effusion, left Active Problems:   HTN (hypertension)   Congestive heart failure   Femur fracture, left   Chronic renal insufficiency, stage III (moderate)   Diabetes type 2, controlled   Acute respiratory failure with hypoxia   Discharge Condition: Stable  Diet recommendation: Heart healthy  Filed Weights   06/25/13 1308 06/26/13 0419 06/27/13 0547  Weight: 63.6 kg (140 lb 3.4 oz) 65.59 kg (144 lb 9.6 oz) 66 kg (145 lb 8.1 oz)    History of present illness:  Alyssa Knight is a 79 y.o. female, With history of chronic combined systolic and diastolic heart failure last EF 35% few months ago,Chronic kidney disease stage IV baseline creatinine between 1.5 and 1.7, hypertension, diabetes mellitus and diet control, recent fall with left femoral fracture status post open reduction internal fixation a few months ago she has recovered well from the surgery, history of left-sided pleural effusion status post thoracentesis few months ago by pulmonary critical care at this facility follows with Dr.Sood, presents to the hospital with 4-5 day history of progressive shortness of breath which is mostly exertional along with orthopnea and increased swelling in both legs, denies any chest pain or palpitations, mild dry cough no fever chills, no abdominal pain or diarrhea no blood in stool or urine, presented to the ER where a workup was consistent with acute respiratory failure  secondary to large left-sided pleural effusion consistent with acute on chronic combined heart failure.       Hospital Course:  [name] is a pleasant 79 year old female with a past medical history of congestive heart, have an ejection fraction of 35%, stage IV chronic kidney disease with baseline creatinine between 1.5 and 1.7, history of left-sided pleural effusion, undergoing thoracentesis in June of 2014, with pleural fluid analysis suggestive of a CHF. She presented with complaints of increasing shortness of breath, having imaging studies showing reaccumulation of left-sided pleural effusion. Patient undergoing thoracentesis, procedure performed on 06/25/2013 with drainage of 1500 mL of clear pleural fluid. Cultures were negative, microscopic exam negative for organisms. She was diuresed with Lasix and by the following day reported significant improvement. Complications during this hospitalization included the development of hypotension after receiving her home regimen of antihypertensive agents. This quickly improved after a 250 mL bolus of normal saline. By 06/27/2013 she reported feeling significantly better, back to her baseline, and had been ambulating down the hallway and back. He was tolerating by mouth intake and expressed her desire to go home today. I spoke with her daughter-in-law Tyronica Truxillo over telephone conversation notified her of patient's discharge. Patient resides at home with family members. I recommended to her daughter to discontinue her antihypertensive agents with the exception of Lasix explaining episode of hypotension during this hospitalization. I explained to her that patient should receive close followup at her primary physician's office in the next several days to check up on blood pressures as well as kidney function. Her daughter verbalized understanding. She was discharged in stable condition on 06/27/2013.  Procedures:  Thoracentesis,  performed on 06/25/2013, with removal  of 1.5 L of clear pleural fluid. Vision tolerated procedure well there no complications  Consultations: Pulmonary medicine  Discharge Exam: Filed Vitals:   06/27/13 0547  BP: 108/49  Pulse: 74  Temp: 97.8 F (36.6 C)  Resp: 20    General: [name] is in no acute distress she is awake alert oriented x3. She states feeling much better today and is asking to go home. She has already ambulating down the hallway and back without any issues. Cardiovascular: Regular rate and rhythm normal S1-S2 no murmurs or gallop Respiratory: Lungs overall clear to auscultation bilaterally with a few bibasal crackles noted. She has normal respiratory effort and is off of supplemental oxygen. Abdomen: Soft nontender nondistended positive bowel sounds Extremities: She has 1-2+ bilateral extremity pitting edema, improved since yesterday's exam  Discharge Instructions  Discharge Orders   Future Orders Complete By Expires   Call MD for:  difficulty breathing, headache or visual disturbances  As directed    Call MD for:  persistant dizziness or light-headedness  As directed    Diet - low sodium heart healthy  As directed    Discharge instructions  As directed    Comments:     Please follow up with your doctor in 3 days for a check up and to repeat lab work   Increase activity slowly  As directed        Medication List    STOP taking these medications       CENTRUM SILVER PO     isosorbide-hydrALAZINE 20-37.5 MG per tablet  Commonly known as:  BIDIL     metoprolol 50 MG tablet  Commonly known as:  LOPRESSOR      TAKE these medications       acetaminophen 500 MG tablet  Commonly known as:  TYLENOL  Take 500 mg by mouth every 6 (six) hours as needed for pain (for pain in leg).     aspirin EC 81 MG tablet  Take 81 mg by mouth daily.     furosemide 20 MG tablet  Commonly known as:  LASIX  Take 20 mg by mouth.       Allergies  Allergen Reactions  . Actos [Pioglitazone] Other (See  Comments)    May have dropped blood sugar too much-family unsure       Follow-up Information   Follow up with Pcp Not In System In 4 days.       The results of significant diagnostics from this hospitalization (including imaging, microbiology, ancillary and laboratory) are listed below for reference.    Significant Diagnostic Studies: Dg Chest 2 View  06/25/2013   *RADIOLOGY REPORT*  Clinical Data: Shortness of breath  CHEST - 2 VIEW  Comparison: 04/14/2013  Findings: Chronic changes are noted on the right.  A large left- sided pleural effusion is identified.  This is new from prior exam. The cardiac shadow is mildly enlarged.  IMPRESSION: Enlarging left-sided pleural effusion.  Chronic changes on the right.   Original Report Authenticated By: Alcide Clever, M.D.   Dg Chest Port 1 View  06/25/2013   *RADIOLOGY REPORT*  Clinical Data: Status post thoracentesis.  PORTABLE CHEST - 1 VIEW  Comparison: Chest x-ray 06/25/2013.  Findings: Compared to the prior study, the previously noted large left pleural effusion has significantly decreased in size, now only a small amount of residual pleural fluid remains.  No definite postprocedural pneumothorax identified.  There has been significant re-expansion of the  left lung, with minimal residual subsegmental atelectasis in the left base.  Gross architectural distortion and multifocal pleural parenchymal calcification throughout the right hemithorax, similar to numerous prior examinations, compatible with chronic fibrothorax. Heart size is mildly enlarged. The patient is rotated to the right on today's exam, resulting in distortion of the mediastinal contours and reduced diagnostic sensitivity and specificity for mediastinal pathology.  Atherosclerosis in the thoracic aorta.  A small calcified granuloma in the left apex is unchanged.  IMPRESSION: 1.  Near complete drainage of the left pleural effusion from recent thoracentesis with significantly improved aeration  throughout the left lung base.  The appearance of the chest is otherwise essentially unchanged, as above. 2.  No evidence of postprocedural pneumothorax.   Original Report Authenticated By: Trudie Reed, M.D.    Microbiology: Recent Results (from the past 240 hour(s))  GRAM STAIN     Status: None   Collection Time    06/25/13 11:30 AM      Result Value Range Status   Specimen Description PLEURAL LEFT FLUID   Final   Special Requests NONE   Final   Gram Stain     Final   Value: CYTOSPIN SLIDE     WBC PRESENT,BOTH PMN AND MONONUCLEAR     NO ORGANISMS SEEN   Report Status 06/25/2013 FINAL   Final  BODY FLUID CULTURE     Status: None   Collection Time    06/25/13 11:30 AM      Result Value Range Status   Specimen Description PLEURAL LEFT FLUID   Final   Special Requests NONE   Final   Gram Stain     Final   Value: CYTOSPIN WBC PRESENT,BOTH PMN AND MONONUCLEAR     NO ORGANISMS SEEN     Performed at [facility]     Performed at [facility]   Culture     Final   Value: NO GROWTH 1 DAY     Performed at [facility]   Report Status PENDING   Incomplete     Labs: Basic Metabolic Panel:  Recent Labs Lab 06/25/13 0910 06/25/13 1043 06/26/13 0400 06/27/13 0340  NA 139 141 139 141  K 5.0 5.1 4.3 4.5  CL 104 105 103 105  CO2 24 25 26 28   GLUCOSE 87 82 81 89  BUN 28* 28* 33* 35*  CREATININE 1.52* 1.52* 1.70* 1.87*  CALCIUM 9.3 9.4 8.9 8.6   Liver Function Tests:  Recent Labs Lab 06/25/13 1043 06/25/13 1126  AST 18  --   ALT <5  --   ALKPHOS 57  --   BILITOT 0.3  --   PROT 7.3 7.4  ALBUMIN 3.3*  --    No results found for this basename: LIPASE, AMYLASE,  in the last 168 hours No results found for this basename: AMMONIA,  in the last 168 hours CBC:  Recent Labs Lab 06/25/13 0910 06/26/13 0400 06/27/13 0340  WBC 6.4 9.0 5.7  NEUTROABS 4.8  --   --   HGB 10.5* 10.1* 9.5*  HCT 32.7* 31.3* 29.6*  MCV 86.7 85.8 86.3  PLT 172 166 145*    Cardiac Enzymes: No results found for this basename: CKTOTAL, CKMB, CKMBINDEX, TROPONINI,  in the last 168 hours BNP: BNP (last 3 results)  Recent Labs  04/12/13 0320 06/25/13 0914  PROBNP 10720.0* 17428.0*   CBG: No results found for this basename: GLUCAP,  in the last 168 hours     Signed:  Circe Chilton  [facility] 06/27/2013, 9:02 AM

## 2013-06-27 NOTE — Progress Notes (Signed)
Patient rested quietly through the night without complaints of pain.

## 2013-06-28 LAB — BODY FLUID CULTURE: Culture: NO GROWTH

## 2013-06-28 LAB — PATHOLOGIST SMEAR REVIEW

## 2013-08-26 ENCOUNTER — Emergency Department (HOSPITAL_COMMUNITY): Payer: Medicare Other

## 2013-08-26 ENCOUNTER — Inpatient Hospital Stay (HOSPITAL_COMMUNITY)
Admission: EM | Admit: 2013-08-26 | Discharge: 2013-08-29 | DRG: 291 | Disposition: A | Discharge status: Home Health | Payer: Medicare Other | Attending: Family Medicine | Admitting: Family Medicine

## 2013-08-26 ENCOUNTER — Encounter (HOSPITAL_COMMUNITY): Payer: Self-pay | Admitting: Emergency Medicine

## 2013-08-26 DIAGNOSIS — D649 Anemia, unspecified: Secondary | ICD-10-CM | POA: Diagnosis present

## 2013-08-26 DIAGNOSIS — N183 Chronic kidney disease, stage 3 unspecified: Secondary | ICD-10-CM | POA: Diagnosis present

## 2013-08-26 DIAGNOSIS — I509 Heart failure, unspecified: Secondary | ICD-10-CM | POA: Diagnosis present

## 2013-08-26 DIAGNOSIS — Z9119 Patient's noncompliance with other medical treatment and regimen: Secondary | ICD-10-CM

## 2013-08-26 DIAGNOSIS — I10 Essential (primary) hypertension: Secondary | ICD-10-CM | POA: Diagnosis present

## 2013-08-26 DIAGNOSIS — I5022 Chronic systolic (congestive) heart failure: Secondary | ICD-10-CM | POA: Diagnosis present

## 2013-08-26 DIAGNOSIS — Z23 Encounter for immunization: Secondary | ICD-10-CM

## 2013-08-26 DIAGNOSIS — N189 Chronic kidney disease, unspecified: Secondary | ICD-10-CM

## 2013-08-26 DIAGNOSIS — J9600 Acute respiratory failure, unspecified whether with hypoxia or hypercapnia: Secondary | ICD-10-CM | POA: Diagnosis present

## 2013-08-26 DIAGNOSIS — I5043 Acute on chronic combined systolic (congestive) and diastolic (congestive) heart failure: Principal | ICD-10-CM | POA: Diagnosis present

## 2013-08-26 DIAGNOSIS — N184 Chronic kidney disease, stage 4 (severe): Secondary | ICD-10-CM | POA: Diagnosis present

## 2013-08-26 DIAGNOSIS — Z8611 Personal history of tuberculosis: Secondary | ICD-10-CM

## 2013-08-26 DIAGNOSIS — I129 Hypertensive chronic kidney disease with stage 1 through stage 4 chronic kidney disease, or unspecified chronic kidney disease: Secondary | ICD-10-CM | POA: Diagnosis present

## 2013-08-26 DIAGNOSIS — N179 Acute kidney failure, unspecified: Secondary | ICD-10-CM | POA: Diagnosis present

## 2013-08-26 DIAGNOSIS — Z91199 Patient's noncompliance with other medical treatment and regimen due to unspecified reason: Secondary | ICD-10-CM

## 2013-08-26 DIAGNOSIS — E41 Nutritional marasmus: Secondary | ICD-10-CM | POA: Diagnosis present

## 2013-08-26 DIAGNOSIS — Z79899 Other long term (current) drug therapy: Secondary | ICD-10-CM

## 2013-08-26 DIAGNOSIS — Z7982 Long term (current) use of aspirin: Secondary | ICD-10-CM

## 2013-08-26 DIAGNOSIS — L089 Local infection of the skin and subcutaneous tissue, unspecified: Secondary | ICD-10-CM | POA: Diagnosis present

## 2013-08-26 DIAGNOSIS — J9601 Acute respiratory failure with hypoxia: Secondary | ICD-10-CM | POA: Diagnosis present

## 2013-08-26 DIAGNOSIS — J90 Pleural effusion, not elsewhere classified: Secondary | ICD-10-CM | POA: Diagnosis present

## 2013-08-26 DIAGNOSIS — I96 Gangrene, not elsewhere classified: Secondary | ICD-10-CM | POA: Diagnosis present

## 2013-08-26 DIAGNOSIS — I4891 Unspecified atrial fibrillation: Secondary | ICD-10-CM | POA: Diagnosis present

## 2013-08-26 DIAGNOSIS — E1159 Type 2 diabetes mellitus with other circulatory complications: Secondary | ICD-10-CM | POA: Diagnosis present

## 2013-08-26 DIAGNOSIS — E119 Type 2 diabetes mellitus without complications: Secondary | ICD-10-CM | POA: Diagnosis present

## 2013-08-26 HISTORY — DX: Heart failure, unspecified: I50.9

## 2013-08-26 LAB — COMPREHENSIVE METABOLIC PANEL
Albumin: 3.5 g/dL (ref 3.5–5.2)
Alkaline Phosphatase: 71 U/L (ref 39–117)
BUN: 41 mg/dL — ABNORMAL HIGH (ref 6–23)
Chloride: 103 mEq/L (ref 96–112)
GFR calc Af Amer: 30 mL/min — ABNORMAL LOW (ref >90)
Glucose, Bld: 140 mg/dL — ABNORMAL HIGH (ref 70–99)
Potassium: 5.1 mEq/L (ref 3.5–5.1)
Total Bilirubin: 0.4 mg/dL (ref 0.3–1.2)

## 2013-08-26 LAB — CBC WITH DIFFERENTIAL/PLATELET
Hemoglobin: 11.3 g/dL — ABNORMAL LOW (ref 12.0–15.0)
Lymphs Abs: 0.8 10*3/uL (ref 0.7–4.0)
Monocytes Relative: 8 % (ref 3–12)
Neutro Abs: 7.6 10*3/uL (ref 1.7–7.7)
Neutrophils Relative %: 82 % — ABNORMAL HIGH (ref 43–77)
RBC: 3.96 MIL/uL (ref 3.87–5.11)

## 2013-08-26 LAB — PRO B NATRIURETIC PEPTIDE: Pro B Natriuretic peptide (BNP): 31531 pg/mL — ABNORMAL HIGH (ref 0–450)

## 2013-08-26 NOTE — ED Notes (Signed)
Pt family states that she might has eaten more than usual this past weekend celebrating [employer] homecoming. Pt states that she takes her lasix everyday but has gained weight over the last couple of days and she has been SOB for the past couple of days as well. Pt denies pain.

## 2013-08-26 NOTE — ED Provider Notes (Signed)
CSN: [id number]     Arrival date & time 08/26/13  2155 History   First MD Initiated Contact with Patient 08/26/13 2236     Chief Complaint  Patient presents with  . Shortness of Breath   (Consider location/radiation/quality/duration/timing/severity/associated sxs/prior Treatment) The history is provided by the patient and a relative. No language interpreter was used.  Alyssa Knight is a 79 y/o F with PMHx of HTN, DM, chronic kidney disease, CHF presenting to the ED with shortness of breath that had started approximately 8-10 hours ago. Patient reported that she has difficulty breathing when speaking, when laying down. Reported that she found herself out of breath with motion, especially when walking to the bathroom. Patient reported that she has noticed that there has been swelling to her legs bilaterally, left leg more so than the right leg. Patient reported that there has been heaviness in her legs bilaterally. Family reported an increase in weight - as per family reported that she was 143.4lbs on 08/24/2013 then 145.5lbs today. Patient reported that she does not use oxygen at home - currently on 2 L of oxygen in ED setting via nasal cannula. Patient reported that she normally takes 20 mg of Lasix, stated that she took 40 mg today. Denied chest pain, neck pain, abdominal pain, urinary and bowel issues.  PCP Dr. Manus Gunning  Past Medical History  Diagnosis Date  . Diabetes mellitus without complication   . HTN (hypertension)   . CKD (chronic kidney disease)   . Systolic CHF   . CHF (congestive heart failure)    Past Surgical History  Procedure Laterality Date  . Femur fracture surgery Left 03/2013    L distal femur lateral condyle s/p ORIF  . Orif femur fracture Left 04/11/2013    Procedure: OPEN REDUCTION INTERNAL FIXATION (ORIF) DISTAL FEMUR FRACTURE- left;  Surgeon: Senaida Lange, MD;  Location: [facility] OR;  Service: Orthopedics;  Laterality: Left;   History reviewed. No pertinent family  history. History  Substance Use Topics  . Smoking status: Never Smoker   . Smokeless tobacco: Never Used  . Alcohol Use: Yes   OB History   Grav Para Term Preterm Abortions TAB SAB Ect Mult Living                 Review of Systems  Constitutional: Negative for fever and chills.  Respiratory: Positive for shortness of breath. Negative for chest tightness.   Cardiovascular: Negative for chest pain.  Neurological: Negative for dizziness and weakness.  All other systems reviewed and are negative.    Allergies  Actos  Home Medications   No current outpatient prescriptions on file. BP 109/71  Pulse 85  Temp(Src) 97.2 F (36.2 C) (Oral)  Resp 18  Ht 5\' 6"  (1.676 m)  Wt 147 lb 0.8 oz (66.7 kg)  BMI 23.75 kg/m2  SpO2 96% Physical Exam  Nursing note and vitals reviewed. Constitutional: She is oriented to person, place, and time. She appears well-developed and well-nourished. No distress.  HENT:  Head: Normocephalic and atraumatic.  Dry mucus membranes  Eyes: Conjunctivae and EOM are normal. Pupils are equal, round, and reactive to light. Right eye exhibits no discharge. Left eye exhibits no discharge.  Neck: Normal range of motion. Neck supple.  Cardiovascular: Normal rate, regular rhythm and normal heart sounds.  Exam reveals no friction rub.   No murmur heard. Swelling noted to the legs, ankles, and feet bilaterally Pitting edema 2+ to the left lower extremity up to just below  the left knee Pitting edema 1+ to the right lower extremity to mid-tib/fib region.   Pulmonary/Chest: Effort normal. She has no wheezes. She has no rales.  Decreased breath sounds bilaterally to upper and lower lobes  Lymphadenopathy:    She has no cervical adenopathy.  Neurological: She is alert and oriented to person, place, and time.  Skin: Skin is warm and dry. No rash noted. She is not diaphoretic. No erythema.  Psychiatric: She has a normal mood and affect. Her behavior is normal. Thought  content normal.    ED Course  Procedures (including critical care time)  12:07 AM Spoke with Dr. Verta Ellen. Patient to be admitted to the hospital for left pleural effusion and CHF exacerbation. Patient to be admitted to Telemetry, inpatient.    Date: 08/27/2013  Rate: 105  Rhythm: sinus tachycardia and premature ventricular contractions (PVC)  QRS Axis: left  Intervals: normal  ST/T Wave abnormalities: normal  Conduction Disutrbances:none  Narrative Interpretation:   Old EKG Reviewed: unchanged EKG analyzed and reviewed by this provider and attending physician.    Labs Review Labs Reviewed  PRO B NATRIURETIC PEPTIDE - Abnormal; Notable for the following:    Pro B Natriuretic peptide (BNP) 31531.0 (*)    All other components within normal limits  CBC WITH DIFFERENTIAL - Abnormal; Notable for the following:    Hemoglobin 11.3 (*)    HCT 35.6 (*)    RDW 16.0 (*)    Neutrophils Relative % 82 (*)    Lymphocytes Relative 9 (*)    All other components within normal limits  COMPREHENSIVE METABOLIC PANEL - Abnormal; Notable for the following:    Glucose, Bld 140 (*)    BUN 41 (*)    Creatinine, Ser 1.62 (*)    GFR calc non Af Amer 26 (*)    GFR calc Af Amer 30 (*)    All other components within normal limits  MAGNESIUM  PHOSPHORUS  TSH  TROPONIN I  TROPONIN I  TROPONIN I  BASIC METABOLIC PANEL  CBC   Imaging Review Dg Chest 2 View  08/26/2013   CLINICAL DATA:  Shortness of breath, cough, history hypertension, diabetes, CHF, MI, chronic kidney disease  EXAM: CHEST  2 VIEW  COMPARISON:  06/25/2013  FINDINGS: Enlargement of cardiac silhouette.  Atherosclerotic calcification aorta.  Large left pleural effusion new since previous exam with associated left basilar atelectasis.  Underlying emphysematous changes in left lung.  Right lung demonstrates extensive pleural and parenchymal calcification/ fibrothorax appearance unchanged from previous exam.  Volume loss in right chest  with mediastinal shift to right.  No pneumothorax.  Bones diffusely demineralized.  IMPRESSION: Chronic right fibrothorax with volume loss.  Emphysematous changes of the left lung with new large left pleural effusion with associated left basilar atelectasis.  Patient did have a large left pleural effusion on earlier study of 06/25/2013 and had undergone left thoracentesis.   Electronically Signed   By: Ulyses Southward M.D.   On: 08/26/2013 23:16    EKG Interpretation     Ventricular Rate:    PR Interval:    QRS Duration:   QT Interval:    QTC Calculation:   R Axis:     Text Interpretation:              MDM   1. Pleural effusion   2. CHF exacerbation   3. CKD (chronic kidney disease)    Patient presenting to the ED with shortness of breath that started today. Patient  reported that she has been out of breath with activity, laying down flat, and speaking. Patient reported swelling in both legs, left leg more so than the right leg. Patient reported that she has taken 40 mg Lasix PO.  Alert and oriented. Heart rate and rhythm normal. Pulses palpable and strong, radial and DP. Lungs decreased breath sounds bilaterally. Negative respiratory distress. Patient placed on 2 L oxygen nasal cannula.  CBC noted anemia - trend seen with patient. CMP elevated BUN and Cr - patient has history of CKD, no dialysis noted, increased in BUN from 35 to 41, and drop in Cr from 1.87 to 1.62. Increase in BNP from 17428 to 31531. Chest xray noted emphysematous changes of the left lung with new large left pleural effusion with associated left basilar atelectasis.  This provider reviewed the patient's chart - patient was seen and admitted to the hospital on 06/25/2013 due to left lung pleural effusion.  Discussed case with Dr. Verta Ellen, patient to be admitted to the hospital for CHF exacerbation with pleural effusion, patient admitted to Telemetry as inpatient. Discussed imaging and results with patient. Discussed  plan for admission with patient and family, agreed to plan of care. Patient stable for transfer.     Raymon Mutton, PA-C 08/27/13 2240

## 2013-08-26 NOTE — ED Notes (Signed)
Shortness of breath for the past 2 days, lower extremity dependent edema, 3+pitting, mild crackles in RLL, tachypnic, but O2 sats remain <94%.  Pt alert and oriented x4, denies chest pain and any other complaints Necrotic wound present on left big toe, family states chronic issue

## 2013-08-27 ENCOUNTER — Encounter (HOSPITAL_COMMUNITY): Payer: Self-pay | Admitting: Emergency Medicine

## 2013-08-27 ENCOUNTER — Inpatient Hospital Stay (HOSPITAL_COMMUNITY): Payer: Medicare Other

## 2013-08-27 LAB — TROPONIN I
Troponin I: <0.3 ng/mL (ref <0.30)
Troponin I: <0.3 ng/mL (ref <0.30)

## 2013-08-27 LAB — GLUCOSE, CAPILLARY
Glucose-Capillary: 102 mg/dL — ABNORMAL HIGH (ref 70–99)
Glucose-Capillary: 72 mg/dL (ref 70–99)

## 2013-08-27 LAB — BODY FLUID CELL COUNT WITH DIFFERENTIAL: Lymphs, Fluid: 22 %

## 2013-08-27 LAB — CBC
HCT: 33 % — ABNORMAL LOW (ref 36.0–46.0)
Hemoglobin: 10.6 g/dL — ABNORMAL LOW (ref 12.0–15.0)
MCV: 89.9 fL (ref 78.0–100.0)
RBC: 3.67 MIL/uL — ABNORMAL LOW (ref 3.87–5.11)
WBC: 9.4 10*3/uL (ref 4.0–10.5)

## 2013-08-27 LAB — BASIC METABOLIC PANEL
CO2: 25 mEq/L (ref 19–32)
Calcium: 8.6 mg/dL (ref 8.4–10.5)
Chloride: 104 mEq/L (ref 96–112)
GFR calc Af Amer: 31 mL/min — ABNORMAL LOW (ref >90)
Glucose, Bld: 114 mg/dL — ABNORMAL HIGH (ref 70–99)
Potassium: 4.9 mEq/L (ref 3.5–5.1)

## 2013-08-27 LAB — COMPREHENSIVE METABOLIC PANEL
Alkaline Phosphatase: 53 U/L (ref 39–117)
BUN: 38 mg/dL — ABNORMAL HIGH (ref 6–23)
Calcium: 8.5 mg/dL (ref 8.4–10.5)
GFR calc Af Amer: 31 mL/min — ABNORMAL LOW (ref >90)
Glucose, Bld: 143 mg/dL — ABNORMAL HIGH (ref 70–99)
Potassium: 5.7 mEq/L — ABNORMAL HIGH (ref 3.5–5.1)
Total Bilirubin: 0.4 mg/dL (ref 0.3–1.2)
Total Protein: 7.2 g/dL (ref 6.0–8.3)

## 2013-08-27 LAB — PHOSPHORUS: Phosphorus: 5.1 mg/dL — ABNORMAL HIGH (ref 2.3–4.6)

## 2013-08-27 LAB — GRAM STAIN

## 2013-08-27 LAB — TSH: TSH: 3.617 u[IU]/mL (ref 0.350–4.500)

## 2013-08-27 LAB — LACTATE DEHYDROGENASE, PLEURAL OR PERITONEAL FLUID: LD, Fluid: 6 U/L (ref 3–23)

## 2013-08-27 LAB — PROTEIN, BODY FLUID: Total protein, fluid: 2.7 g/dL

## 2013-08-27 MED ORDER — ONDANSETRON HCL 4 MG PO TABS: 4.0000 mg | ORAL_TABLET | Freq: Four times a day (QID) | ORAL | Status: DC | PRN

## 2013-08-27 MED ORDER — FUROSEMIDE 10 MG/ML IJ SOLN
40.0000 mg | Freq: Every day | INTRAMUSCULAR | Status: DC
  Administered 2013-08-27 – 2013-08-28 (×2): 40 mg via INTRAVENOUS
  Filled 2013-08-27 (×2): qty 4

## 2013-08-27 MED ORDER — ASPIRIN EC 81 MG PO TBEC
81.0000 mg | DELAYED_RELEASE_TABLET | Freq: Every day | ORAL | Status: DC
Start: 2013-08-27 — End: 2013-08-29
  Administered 2013-08-27 – 2013-08-29 (×3): 81 mg via ORAL
  Filled 2013-08-27 (×3): qty 1

## 2013-08-27 MED ORDER — ACETAMINOPHEN 325 MG PO TABS: 650.0000 mg | ORAL_TABLET | Freq: Four times a day (QID) | ORAL | Status: DC | PRN

## 2013-08-27 MED ORDER — INFLUENZA VAC SPLIT QUAD 0.5 ML IM SUSP
0.5000 mL | INTRAMUSCULAR | Status: DC
  Filled 2013-08-27: qty 0.5

## 2013-08-27 MED ORDER — HYDROCODONE-ACETAMINOPHEN 5-325 MG PO TABS: 1.0000 | ORAL_TABLET | ORAL | Status: DC | PRN

## 2013-08-27 MED ORDER — ONDANSETRON HCL 4 MG/2ML IJ SOLN: 4.0000 mg | Freq: Four times a day (QID) | INTRAMUSCULAR | Status: DC | PRN

## 2013-08-27 MED ORDER — SODIUM CHLORIDE 0.9 % IJ SOLN
3.0000 mL | Freq: Two times a day (BID) | INTRAMUSCULAR | Status: DC
  Administered 2013-08-27 – 2013-08-28 (×5): 3 mL via INTRAVENOUS

## 2013-08-27 MED ORDER — ENOXAPARIN SODIUM 30 MG/0.3ML ~~LOC~~ SOLN
30.0000 mg | SUBCUTANEOUS | Status: DC
  Administered 2013-08-27 – 2013-08-29 (×3): 30 mg via SUBCUTANEOUS
  Filled 2013-08-27 (×4): qty 0.3

## 2013-08-27 MED ORDER — MORPHINE SULFATE 2 MG/ML IJ SOLN: 1.0000 mg | INTRAMUSCULAR | Status: DC | PRN

## 2013-08-27 MED ORDER — INSULIN ASPART 100 UNIT/ML ~~LOC~~ SOLN
0.0000 [IU] | Freq: Three times a day (TID) | SUBCUTANEOUS | Status: DC
  Administered 2013-08-27: 18:00:00 1 [IU] via SUBCUTANEOUS

## 2013-08-27 NOTE — Progress Notes (Signed)
Pt admitted to 4E20, came by stretcher, pt AO x 4, VSS, family member at the bedside during admission, pt place on fall precaution, bed alarm in place, pt and family member oriented to the unit and to her room and encouraged to call for assistance to get OOB to prevent any fall or injury while in the hospital.  Heart Failure education started with the pt and family member. We'll continue with POC.

## 2013-08-27 NOTE — H&P (Signed)
[facility] History and Physical  Alyssa Knight:[id number] DOB: 1917/08/25 DOA: 08/26/2013  Referring physician: ED physician PCP: No PCP Per Patient   Chief Complaint: shortness of breath  HPI:  Pt is rather functional 79 yo female who presented to [facility] ED with main concern of progressively worsening shortness of breath that started several days prior to this admission and associated with non productive cough, 2-3 pillow orthopnea and malaise. Pt denies any chest pain and no similar events in the past. She said she took extra dose of Lasix today but that did not help. She denies fevers, chills, no specific abdominal or urinary concerns. Please note that pt was hospitalized in August 2014 and at that time left sided pleural effusion was noted. Thoracentesis was performed on 06/25/2013, with removal of 1.5 L of clear pleural fluid.  In ED, her BNP was > 30,000 and her initial oxygen saturation was in mid 80's. THis has improved to 90's with oxygen and [facility] asked to admit for CHF exacerbation.   Assessment and Plan: Acute hypoxic respiratory failure - secondary to CHF and left pleural effusion - will admit to telemetry unit for further evaluation - place on Lasix 40 mg IV daily and readjust the dose as indicated - daily I's and O's, weights - close monitoring of renal function in the setting of diuresis - if no clinical improvement consider cardio consult and IR for thoracentesis Combined systolic and diastolic CHF - worsening BNP - will place on Lasix 40 mg IV and plan on readjusting the dose as indicated  - will order 2 D ECHO  Acute on chronic renal failure - likely pre renal component imposed on chronic failure and possibly new baseline - will monitor closely with diuresis  DM - last A1C 05/2013 was 6.8 - will place on SSI   Code Status: Full Family Communication: Pt at bedside Disposition Plan: Admit to telemetry bed    Review of Systems:  Constitutional: Negative  for fever, chills and malaise/fatigue. Negative for diaphoresis.  HENT: Negative for hearing loss, ear pain, nosebleeds, congestion, sore throat, neck pain, tinnitus and ear discharge.   Eyes: Negative for blurred vision, double vision, photophobia, pain, discharge and redness.  Respiratory: Per HPI Cardiovascular: Negative for chest pain, palpitations, orthopnea, claudication and leg swelling.  Gastrointestinal: Negative for nausea, vomiting and abdominal pain. Negative for heartburn, constipation, blood in stool and melena.  Genitourinary: Negative for dysuria, urgency, frequency, hematuria and flank pain.  Musculoskeletal: Negative for myalgias, back pain, joint pain and falls.  Skin: Negative for itching and rash.  Neurological:  Negative for tingling, tremors, sensory change, speech change, focal weakness, loss of consciousness and headaches.  Endo/Heme/Allergies: Negative for environmental allergies and polydipsia. Does not bruise/bleed easily.  Psychiatric/Behavioral: Negative for suicidal ideas. The patient is not nervous/anxious.      Past Medical History  Diagnosis Date  . Diabetes mellitus without complication   . HTN (hypertension)   . CKD (chronic kidney disease)   . Systolic CHF   . CHF (congestive heart failure)     Past Surgical History  Procedure Laterality Date  . Femur fracture surgery Left 03/2013    L distal femur lateral condyle s/p ORIF  . Orif femur fracture Left 04/11/2013    Procedure: OPEN REDUCTION INTERNAL FIXATION (ORIF) DISTAL FEMUR FRACTURE- left;  Surgeon: Senaida Lange, MD;  Location: [facility] OR;  Service: Orthopedics;  Laterality: Left;    Social History:  reports that she has never smoked. She  does not have any smokeless tobacco history on file. She reports that she drinks alcohol. She reports that she does not use illicit drugs.  Allergies  Allergen Reactions  . Actos [Pioglitazone] Other (See Comments)    May have dropped blood sugar too much-family  unsure    History reviewed. No pertinent family history.  Prior to Admission medications   Medication Sig Start Date End Date Taking? Authorizing Provider  acetaminophen (TYLENOL) 500 MG tablet Take 500 mg by mouth every 6 (six) hours as needed for pain (for pain in leg).   Yes Historical Provider, MD  aspirin EC 81 MG tablet Take 81 mg by mouth daily.   Yes Historical Provider, MD  dextromethorphan (DELSYM) 30 MG/5ML liquid Take 30 mg by mouth as needed for cough.   Yes Historical Provider, MD  furosemide (LASIX) 20 MG tablet Take 20 mg by mouth daily.    Yes Historical Provider, MD    Physical Exam: Filed Vitals:   08/26/13 2200 08/26/13 2211 08/26/13 2236  BP: 140/66  133/78  Pulse: 97  91  Temp: 97.7 F (36.5 C)    TempSrc: Oral    Resp: 16  27  Weight:  71.697 kg (158 lb 1 oz)   SpO2: 97%  95%    Physical Exam  Constitutional: Appears well-developed and well-nourished. No distress.  HENT: Normocephalic. External right and left ear normal. Oropharynx is clear and moist.  Eyes: Conjunctivae and EOM are normal. PERRLA, no scleral icterus.  Neck: Normal ROM. Neck supple. No JVD. No tracheal deviation. No thyromegaly.  CVS: RRR, S1/S2 +, no murmurs, no gallops, no carotid bruit.  Pulmonary: diminished air movement on the left side with crackles at base, right side fairly clear to auscultation  Abdominal: Soft. BS +,  no distension, tenderness, rebound or guarding.  Musculoskeletal: Normal range of motion.  Lymphadenopathy: No lymphadenopathy noted, cervical, inguinal. Neuro: Alert. Normal reflexes, muscle tone coordination. No cranial nerve deficit. Skin: Skin is warm and dry. No rash noted. Not diaphoretic. No erythema. No pallor.  Psychiatric: Normal mood and affect. Behavior, judgment, thought content normal.   Labs on Admission:  Basic Metabolic Panel:  Recent Labs Lab 08/26/13 2230  NA 140  K 5.1  CL 103  CO2 27  GLUCOSE 140*  BUN 41*  CREATININE 1.62*   CALCIUM 8.8   Liver Function Tests:  Recent Labs Lab 08/26/13 2230  AST 12  ALT <5  ALKPHOS 71  BILITOT 0.4  PROT 7.9  ALBUMIN 3.5   CBC:  Recent Labs Lab 08/26/13 2230  WBC 9.2  NEUTROABS 7.6  HGB 11.3*  HCT 35.6*  MCV 89.9  PLT 187   Radiological Exams on Admission: Dg Chest 2 View  08/26/2013   CLINICAL DATA:  Shortness of breath, cough, history hypertension, diabetes, CHF, MI, chronic kidney disease  EXAM: CHEST  2 VIEW  COMPARISON:  06/25/2013  FINDINGS: Enlargement of cardiac silhouette.  Atherosclerotic calcification aorta.  Large left pleural effusion new since previous exam with associated left basilar atelectasis.  Underlying emphysematous changes in left lung.  Right lung demonstrates extensive pleural and parenchymal calcification/ fibrothorax appearance unchanged from previous exam.  Volume loss in right chest with mediastinal shift to right.  No pneumothorax.  Bones diffusely demineralized.  IMPRESSION: Chronic right fibrothorax with volume loss.  Emphysematous changes of the left lung with new large left pleural effusion with associated left basilar atelectasis.  Patient did have a large left pleural effusion on earlier study of  06/25/2013 and had undergone left thoracentesis.   Electronically Signed   By: Ulyses Southward M.D.   On: 08/26/2013 23:16   EKG: Normal sinus rhythm, no ST/T wave changes  Debbora Presto, MD  [facility] Pager [phone]  If 7PM-7AM, please contact night-coverage [email or web address] Password [email or web address] 08/27/2013, 12:50 AM

## 2013-08-27 NOTE — Progress Notes (Signed)
Alyssa Knight:[id number] DOB: 1917/09/10 DOA: 08/26/2013 PCP: No PCP Per Patient  Brief narrative: 79 y/o ?, known history of heart failure, last echocardiogram EF 30-35% 04/12/2013 [grade 3 diastolic dysfunction-poor acoustic windows], Baseline CKD stage 3-41,history of tuberculosis in 1960, treated, diabetes mellitus A1c 6.8 8/14 admitted 08/27/13 a.m. With dyspnea, orthopnea, BNP >30,000, O2 sats 80% CXR =left neural effusion, consolidation/prior? TB right lung with midline trachea shift IR consulted 11/1 for therapeutic thoracocentesis  Past medical history-As per Problem list Chart reviewed as below- Admission 06/25/2013 for decompensated CHF, acute respiratory failure Admission 04/10/2013 for renal insufficiency, left femoral fracture, acute CHF exacerbation, dilutional anemia is status post surgery Admission 11/18/2008 for acute renal failure, CHF exacerbation Admission 08/22/2006 for acute kidney injury secondary to contrast nephropathy Admission 12/13/1999 accelerated hypertension, new diagnosis of diabetes mellitus, hepatosplenomegaly, CAD, benign breast lump  Consultants:  None currently  Procedures:  none  Antibiotics:  none   Subjective  Sitting at bedside, not short of breath. States she feels a little better after getting Lasix IV. Tolerating diet About to go for ultrasound guided thoracocentesis   Objective    Interim History:   Telemetry: Atrial fibrillation  Objective: Filed Vitals:   08/26/13 2211 08/26/13 2236 08/27/13 0112 08/27/13 0500  BP:  133/78 109/71 113/56  Pulse:  91 85 85  Temp:   97.2 F (36.2 C) 98 F (36.7 C)  TempSrc:   Oral Oral  Resp:  27 18 18   Height:   5\' 6"  (1.676 m)   Weight: 71.697 kg (158 lb 1 oz)  66.7 kg (147 lb 0.8 oz)   SpO2:  95% 96% 97%    Intake/Output Summary (Last 24 hours) at 08/27/13 1219 Last data filed at 08/27/13 0700  Gross per 24 hour  Intake    236 ml  Output    550 ml  Net   -314 ml     Exam:  General: patient looks significantly younger than stated age, EOMI, NCAT, moderate dentition, no JVD Cardiovascular: S1-S2 atrial fibrillation,PMI not measured Respiratory:  clinically clearon the right-sided, not able to appreciate fremitus or resonance on the left Abdomen: soft nontender nondistended Skinstage 2-3 lower extremity edema Neurogrossly intact moving all 4 limbs equally power 5/5  Data Reviewed: Basic Metabolic Panel:  Recent Labs Lab 08/26/13 2230 08/27/13 0250  NA 140 139  K 5.1 4.9  CL 103 104  CO2 27 25  GLUCOSE 140* 114*  BUN 41* 39*  CREATININE 1.62* 1.57*  CALCIUM 8.8 8.6  MG  --  2.2  PHOS  --  5.1*   Liver Function Tests:  Recent Labs Lab 08/26/13 2230  AST 12  ALT <5  ALKPHOS 71  BILITOT 0.4  PROT 7.9  ALBUMIN 3.5   No results found for this basename: LIPASE, AMYLASE,  in the last 168 hours No results found for this basename: AMMONIA,  in the last 168 hours CBC:  Recent Labs Lab 08/26/13 2230 08/27/13 0250  WBC 9.2 9.4  NEUTROABS 7.6  --   HGB 11.3* 10.6*  HCT 35.6* 33.0*  MCV 89.9 89.9  PLT 187 158   Cardiac Enzymes:  Recent Labs Lab 08/27/13 0250 08/27/13 0812  TROPONINI <0.30 <0.30   BNP: No components found with this basename: POCBNP,  CBG:  Recent Labs Lab 08/27/13 0552 08/27/13 1137  GLUCAP 102* 102*    No results found for this or any previous visit (from the past 240 hour(s)).   Studies:  All Imaging reviewed and is as per above notation   Scheduled Meds: . aspirin EC  81 mg Oral Daily  . enoxaparin (LOVENOX) injection  30 mg Subcutaneous Q24H  . furosemide  40 mg Intravenous Daily  . [START ON 08/28/2013] influenza vac split quadrivalent PF  0.5 mL Intramuscular Tomorrow-1000  . insulin aspart  0-9 Units Subcutaneous TID WC  . sodium chloride  3 mL Intravenous Q12H   Continuous Infusions:    Assessment/Plan: 1. Acute hypoxic respiratory failure-multifactorial, secondary  to possible pleural effusion vs. CHF exacerbation-have spoken to Dr. Lowella Dandy of radiology, ultrasound-guided paracentesis performed today yielding 1.5 L of clear yellow fluid-continue diuretics-continue oxygen. Ins and outs and not accurate.  Repeat chest x-ray 11/1 shows significant decrease in left hemidiaphragm elevation 2. Decompensated systolic/diastolic heart failure-follow 2-D echo 3. History of pulmonary tuberculosis, 1960?-Chest x-ray shows calcifications on the right side as well as retraction and tracheal shift towards the right. Await pleural fluid diagnostic testing. 4. Acute renal failure in a setting of stage III and CKD-continue diuresis. Labs a.m.. 5. Normocytic anemia-baseline hemoglobin 10 point 0-11.0 6. Diet-controlled diabetes mellitus-A1c 6.8. Would not aggressively control given age, comorbidities 7. Left great toe infection/dry gangrene-stable. Follow with podiatrist 8. Moderate to severe malnutrition-monitor  Code Status: full Family Communication:  Disposition Plan: inpatient   Pleas Koch, MD  [facility] Pager [phone] 08/27/2013, 12:19 PM    LOS: 1 day

## 2013-08-27 NOTE — Progress Notes (Signed)
PT Cancellation Note  Patient Details Name: Alyssa Knight MRN: [id number] DOB: May 03, 1917   Cancelled Treatment:    Reason Eval/Treat Not Completed: Patient at procedure or test/unavailable   [name] 08/27/2013, 1:25 PM Delaney Meigs, PT [phone]

## 2013-08-27 NOTE — Progress Notes (Signed)
Pt resting on bed comfortable on RA, denies any pain or discomfort, VSS, no distress noticed. We'll continue with POC.

## 2013-08-27 NOTE — Procedures (Signed)
Successful US guided left thoracentesis. Yielded 1.5L of clear yellow fluid. Pt tolerated procedure well. No immediate complications.  Specimen was sent for labs. CXR ordered.  Brayton El PA-C 08/27/2013 1:40 PM

## 2013-08-28 DIAGNOSIS — J90 Pleural effusion, not elsewhere classified: Secondary | ICD-10-CM

## 2013-08-28 LAB — BASIC METABOLIC PANEL
CO2: 28 mEq/L (ref 19–32)
Calcium: 8.4 mg/dL (ref 8.4–10.5)
Chloride: 100 mEq/L (ref 96–112)
GFR calc Af Amer: 29 mL/min — ABNORMAL LOW (ref >90)
GFR calc non Af Amer: 25 mL/min — ABNORMAL LOW (ref >90)
Sodium: 136 mEq/L (ref 135–145)

## 2013-08-28 LAB — GLUCOSE, CAPILLARY
Glucose-Capillary: 93 mg/dL (ref 70–99)
Glucose-Capillary: 112 mg/dL — ABNORMAL HIGH (ref 70–99)

## 2013-08-28 MED ORDER — FUROSEMIDE 40 MG PO TABS
40.0000 mg | ORAL_TABLET | Freq: Two times a day (BID) | ORAL | Status: DC
  Administered 2013-08-29: 09:00:00 40 mg via ORAL
  Filled 2013-08-28 (×3): qty 1

## 2013-08-28 NOTE — Evaluation (Signed)
Physical Therapy Evaluation Patient Details Name: Alyssa Knight MRN: [id number] DOB: [birth year] Today's Date: 08/28/2013 Time: 7829-5621 PT Time Calculation (min): 31 min  PT Assessment / Plan / Recommendation History of Present Illness  Pt is rather functional 79 yo female who presented to [facility] ED with main concern of progressively worsening shortness of breath that started several days prior to this admission and associated with non productive cough, 2-3 pillow orthopnea and malaise. Pt denies any chest pain and no similar events in the past. She said she took extra dose of Lasix today but that did not help. She denies fevers, chills, no specific abdominal or urinary concerns. Please note that pt was hospitalized in August 2014 and at that time left sided pleural effusion was noted. Thoracentesis was performed on 06/25/2013, with removal of 1.5 L of clear pleural fluid.  Clinical Impression  Pt admitted with the above. Pt currently with functional limitations due to the deficits listed below (see PT Problem List). Pt very pleasant and willing to work with therapy.  Pt near baseline of function and will continue to follow acutely.  Do not anticipate any PT needs at d/c.  Pt will benefit from skilled PT to increase their independence and safety with mobility to allow discharge to the venue listed below.      PT Assessment  Patient needs continued PT services    Follow Up Recommendations  No PT follow up;Supervision/Assistance - 24 hour    Equipment Recommendations  None recommended by PT    Frequency Min 3X/week    Precautions / Restrictions Precautions Precautions: Fall (reports one fall in the last month)   Pertinent Vitals/Pain No c/o pain; reports mild SOB after ambulation 1/4 on dyspnea scale      Mobility  Bed Mobility Bed Mobility: Not assessed (Pt sitting in recliner when entering) Transfers Transfers: Sit to Stand;Stand to Sit Sit to Stand: 5: Supervision;From  chair/3-in-1 Stand to Sit: 5: Supervision;To chair/3-in-1 Details for Transfer Assistance: Supervision for safety.  Pt with correct hand placement Ambulation/Gait Ambulation/Gait Assistance: 4: Min guard;5: Supervision Ambulation Distance (Feet): 100 Feet Assistive device: Rolling walker Ambulation/Gait Assistance Details: Initial minguard for safety but able to advance to supervision.  Gait Pattern: Step-through pattern;Decreased stride length;Trunk flexed Gait velocity: decreased Stairs: No    Exercises     PT Diagnosis: Difficulty walking  PT Problem List: Decreased strength;Decreased activity tolerance;Decreased balance;Decreased knowledge of use of DME;Cardiopulmonary status limiting activity PT Treatment Interventions: DME instruction;Gait training;Functional mobility training;Therapeutic activities;Therapeutic exercise;Balance training;Patient/family education     PT Goals(Current goals can be found in the care plan section) Acute Rehab PT Goals Patient Stated Goal: To return home PT Goal Formulation: With patient Time For Goal Achievement: 09/04/13 Potential to Achieve Goals: Good  Visit Information  Last PT Received On: 08/28/13 Assistance Needed: +1 History of Present Illness: Pt is rather functional 79 yo female who presented to [facility] ED with main concern of progressively worsening shortness of breath that started several days prior to this admission and associated with non productive cough, 2-3 pillow orthopnea and malaise. Pt denies any chest pain and no similar events in the past. She said she took extra dose of Lasix today but that did not help. She denies fevers, chills, no specific abdominal or urinary concerns. Please note that pt was hospitalized in August 2014 and at that time left sided pleural effusion was noted. Thoracentesis was performed on 06/25/2013, with removal of 1.5 L of clear pleural fluid.  Prior Functioning  Home Living Family/patient expects to  be discharged to:: Private residence Living Arrangements: Children Available Help at Discharge: Family;Available 24 hours/day Type of Home: House Home Access: Ramped entrance Entrance Stairs-Rails: Right Home Layout: One level Home Equipment: Walker - 2 wheels;Shower seat;Bedside commode;Wheelchair - manual Prior Function Level of Independence: Needs assistance Gait / Transfers Assistance Needed: assist with walker for in home ambulation, uses wheelchair for outings Communication Communication: No difficulties Dominant Hand: Right    Cognition  Cognition Arousal/Alertness: Awake/alert Behavior During Therapy: WFL for tasks assessed/performed Overall Cognitive Status: Within Functional Limits for tasks assessed    Extremity/Trunk Assessment Lower Extremity Assessment Lower Extremity Assessment: Overall WFL for tasks assessed   Balance Balance Balance Assessed: Yes Static Sitting Balance Static Sitting - Balance Support: Feet supported Static Sitting - Level of Assistance: 6: Modified independent (Device/Increase time)  End of Session PT - End of Session Equipment Utilized During Treatment: Gait belt Activity Tolerance: Patient tolerated treatment well Patient left: in chair;with call bell/phone within reach Nurse Communication: Mobility status  GP     Erez Mccallum 08/28/2013, 9:12 AM  Jake Shark, PT DPT [phone]

## 2013-08-28 NOTE — Progress Notes (Signed)
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 With dyspnea, orthopnea, BNP >30,000, O2 sats 80% CXR =left neural effusion, consolidation/prior? TB right lung with midline trachea shift IR consulted 11/1 for therapeutic thoracocentesis, drained 1.5 liter sof fluid.  Patient is non-compliant on lasix at home.  Past medical history-As per Problem list Chart reviewed as below- Admission 06/25/2013 for decompensated CHF, acute respiratory failure Admission 04/10/2013 for renal insufficiency, left femoral fracture, acute CHF exacerbation, dilutional anemia is status post surgery Admission 11/18/2008 for acute renal failure, CHF exacerbation Admission 08/22/2006 for acute kidney injury secondary to contrast nephropathy Admission 12/13/1999 accelerated hypertension, new diagnosis of diabetes mellitus, hepatosplenomegaly, CAD, benign breast lump  Consultants:  None currently  Therapy recommends 24 hr supervision  Procedures:  therapetuic paracentesis 11/1  Antibiotics:  none   Subjective  Sitting at bedside, much better.  Doesn;t need oxygen atight now Ambulate w therapy   Objective    Interim History:   Telemetry: Atrial fibrillation  Objective: Filed Vitals:   08/27/13 1445 08/27/13 2014 08/28/13 0629 08/28/13 1407  BP: 109/45 103/50 131/54 106/66  Pulse: 72 90 107 106  Temp: 97.5 F (36.4 C) 97.8 F (36.6 C) 98.3 F (36.8 C) 97.5 F (36.4 C)  TempSrc: Oral Oral Oral Oral  Resp: 18 18 20 20   Height:      Weight:   63.2 kg (139 lb 5.3 oz)   SpO2: 95% 96% 93% 91%    Intake/Output Summary (Last 24 hours) at 08/28/13 1543 Last data filed at 08/28/13 1000  Gross per 24 hour  Intake    538 ml  Output   1251 ml  Net   -713 ml    Exam:  General: patient looks significantly younger than stated age, EOMI, NCAT, moderate dentition, no JVD Cardiovascular: S1-S2 atrial fibrillation,PMI not measured Respiratory:  clinically clearon the right-sided, not able to appreciate fremitus or resonance on the left Abdomen: soft nontender nondistended Skinstage 2-3 lower extremity edema Neurogrossly intact moving all 4 limbs equally power 5/5  Data Reviewed: Basic Metabolic Panel:  Recent Labs Lab 08/26/13 2230 08/27/13 0250 08/27/13 1520 08/28/13 1000  NA 140 139 137 136  K 5.1 4.9 5.7* 4.3  CL 103 104 101 100  CO2 27 25 24 28   GLUCOSE 140* 114* 143* 168*  BUN 41* 39* 38* 38*  CREATININE 1.62* 1.57* 1.60* 1.66*  CALCIUM 8.8 8.6 8.5 8.4  MG  --  2.2  --   --   PHOS  --  5.1*  --   --    Liver Function Tests:  Recent Labs Lab 08/26/13 2230 08/27/13 1520  AST 12 20  ALT <5 <5  ALKPHOS 71 53  BILITOT 0.4 0.4  PROT 7.9 7.2  ALBUMIN 3.5 3.0*   No results found for this basename: LIPASE, AMYLASE,  in the last 168 hours No results found for this basename: AMMONIA,  in the last 168 hours CBC:  Recent Labs Lab 08/26/13 2230 08/27/13 0250  WBC 9.2 9.4  NEUTROABS 7.6  --   HGB 11.3* 10.6*  HCT 35.6* 33.0*  MCV 89.9 89.9  PLT 187 158   Cardiac Enzymes:  Recent Labs Lab 08/27/13 0250 08/27/13 0812 08/27/13 1437  TROPONINI <0.30 <0.30 <  0.30   BNP: No components found with this basename: POCBNP,  CBG:  Recent Labs Lab 08/27/13 0552 08/27/13 1137 08/27/13 1620 08/27/13 2143 08/28/13 0648  GLUCAP 102* 102* 133* 72 93    Recent Results (from the past 240 hour(s))  GRAM STAIN     Status: None   Collection Time    08/27/13  1:48 PM      Result Value Range Status   Specimen Description FLUID LEFT PLEURAL   Final   Special Requests NONE   Final   Gram Stain     Final   Value: CYTOSPIN SLIDE     WBC PRESENT,BOTH PMN  AND MONONUCLEAR     NO ORGANISMS SEEN   Report Status 08/27/2013 FINAL   Final  BODY FLUID CULTURE     Status: None   Collection Time    08/27/13  1:48 PM      Result Value Range Status   Specimen Description FLUID LEFT PLEURAL   Final   Special Requests NONE   Final   Gram Stain     Final   Value: CYTOSPIN WBC PRESENT,BOTH PMN AND MONONUCLEAR     NO ORGANISMS SEEN     Performed at [facility]     Performed at [facility]   Culture     Final   Value: NO GROWTH 1 DAY     Performed at [facility]   Report Status PENDING   Incomplete     Studies:              All Imaging reviewed and is as per above notation   Scheduled Meds: . aspirin EC  81 mg Oral Daily  . enoxaparin (LOVENOX) injection  30 mg Subcutaneous Q24H  . furosemide  40 mg Intravenous Daily  . influenza vac split quadrivalent PF  0.5 mL Intramuscular Tomorrow-1000  . insulin aspart  0-9 Units Subcutaneous TID WC  . sodium chloride  3 mL Intravenous Q12H   Continuous Infusions:    Assessment/Plan: 1. Acute hypoxic respiratory failure-multifactorial, secondary to possible pleural effusion vs. CHF exacerbation-, s/p Therapeutic thoracocentesis 11.1.  Repeat chest x-ray 11/1 shows significant decrease in left hemidiaphragm elevation 2. Decompensated systolic/diastolic heart failure-follow 2-D echo--pending, lasix  to PO 11/2 3. History of pulmonary tuberculosis, 1960?-Chest x-ray shows calcifications on the right side as well as retraction and tracheal shift towards the right. Await pleural fluid diagnostic testing. 4. Acute renal failure in a setting of stage III and CKD-continue diuresis. Labs a.m.  Creat has peaked, cut back IV laix to PO 40 bid 5. Normocytic anemia-baseline hemoglobin 10 point 0-11.0 6. Diet-controlled diabetes mellitus-A1c 6.8. Would not aggressively control given age, comorbidities 7. Left great toe infection/dry gangrene-stable. Follow with podiatrist 8. Moderate to  severe malnutrition-monitor  Code Status: full Family Communication:  Disposition Plan: inpatient   Pleas Koch, MD  [facility] Pager [phone] 08/28/2013, 3:43 PM    LOS: 2 days

## 2013-08-28 NOTE — Progress Notes (Signed)
Utilization Review Completed.Melodye Swor T11/11/2012  

## 2013-08-29 LAB — BASIC METABOLIC PANEL
GFR calc Af Amer: 29 mL/min — ABNORMAL LOW (ref >90)
GFR calc non Af Amer: 25 mL/min — ABNORMAL LOW (ref >90)
Glucose, Bld: 89 mg/dL (ref 70–99)
Potassium: 4.3 mEq/L (ref 3.5–5.1)
Sodium: 138 mEq/L (ref 135–145)

## 2013-08-29 LAB — GLUCOSE, CAPILLARY: Glucose-Capillary: 81 mg/dL (ref 70–99)

## 2013-08-29 MED ORDER — FUROSEMIDE 40 MG PO TABS: 40.0000 mg | ORAL_TABLET | Freq: Two times a day (BID) | ORAL | Status: DC

## 2013-08-29 MED ORDER — COLLAGENASE 250 UNIT/GM EX OINT: TOPICAL_OINTMENT | Freq: Every day | CUTANEOUS | Status: DC

## 2013-08-29 NOTE — Discharge Summary (Signed)
Physician Discharge Summary  Alyssa Knight AVW:[id number] DOB: [birth year] DOA: 08/26/2013  PCP: Thora Lance, MD  Admit date: 08/26/2013 Discharge date: 08/29/2013  Time spent: 35 minutes  Recommendations for Outpatient Follow-up:  1. Check your weights daily 2. Low-salt diet encouraged 3. Home health nurse ordered to check on compliance medications as well as weight 4. Appointment scheduled to see Dr. Delton See of cardiology in about one week to help delineate further modalities for treatment-I have discussed her EF with Dr. Delton See and this will be addressed in the OP setting 5. Followup with PCP-Please follow Plerual fluid cultures as she has ? H/o Pulm TB 60 yrs prior   Discharge Diagnoses:  Principal Problem:   Acute systolic heart failure Active Problems:   HTN (hypertension)   Chronic renal insufficiency, stage III (moderate)   Diabetes type 2, controlled   Acute respiratory failure with hypoxia   Pleural effusion, left   Discharge Condition: Stable  Diet recommendation: Low-salt heart healthy  Filed Weights   08/27/13 0112 08/28/13 0629 08/29/13 0651  Weight: 66.7 kg (147 lb 0.8 oz) 63.2 kg (139 lb 5.3 oz) 62.959 kg (138 lb 12.8 oz)    History of present illness:  79 y/o ?, known history of heart failure, last echocardiogram EF 30-35% 04/12/2013 [grade 3 diastolic dysfunction-poor acoustic windows], Baseline CKD stage 3-41,history of tuberculosis in 1960, treated, diabetes mellitus A1c 6.8 8/14 admitted 08/27/13 a.m. With dyspnea, orthopnea, BNP >30,000, O2 sats 80%  CXR =left neural effusion, consolidation/prior? TB right lung with midline trachea shift  IR consulted 11/1 for therapeutic thoracocentesis, drained 1.5 liter sof fluid. Patient is non-compliant on lasix at home.   Hospital Course:   1. Acute hypoxic respiratory failure-multifactorial, secondary to possible pleural effusion vs. CHF exacerbation-, s/p Therapeutic thoracocentesis 11.1.  Per light's  criterion this is a transudate. Repeat chest x-ray 11/1 shows significant decrease in left hemidiaphragm elevation.  Hemodynamically stable. On discharge 08/29/13, ambulated without lp oxygen 2. Decompensated systolic/diastolic heart failure-EF on echo 15-20% on  lasix to po 40 mg twice a day.  Only 20 tablets prescribed-will need to see both PCP/cardiologist to help determine best dose as she also has some renal insufficiency 3. History of pulmonary tuberculosis, 1960?-Chest x-ray shows calcifications on the right side as well as retraction and tracheal shift towards the right. Await pleural fluid diagnostic testing which can be follwed as an OP 4. Acute renal failure in a setting of stage III and CKD-continue diuresis. Labs a.m. Creat has peaked, cut back IV laix to PO 40 bid 5. Normocytic anemia-baseline hemoglobin 10 point 0-11.0 6. Diet-controlled diabetes mellitus-A1c 6.8. Would not aggressively control given age, comorbidities 7. Left great toe infection/dry gangrene-stable. Follow with podiatrist-no s/symptoms of acute infection 8. Moderate to severe malnutrition-monitor   Procedures:  Therapeutic Thoracocentesis 08/27/13  Consultations:  IR   Discharge Exam: Filed Vitals:   08/29/13 0651  BP: 107/60  Pulse: 100  Temp: 98.4 F (36.9 C)  Resp: 18   Alert pleasant oriented.  No apprent distress  General: EOMI, NCAT Cardiovascular:  s1 s2 no m/r/g Respiratory: clear   Discharge Instructions  Discharge Orders   Future Orders Complete By Expires   Diet - low sodium heart healthy  As directed    Discharge instructions  As directed    Comments:     Patient needs to measure her weight daily, please take an extra dose of Lasix a few gain more than 3-4 pounds in a 48 hour period Please followup  with cardiology-  She will  need to have a specific cardiologist followup-this is the second time in about 2 months she has presented with decompensated heart failure Patient will need  a basic metabolic panel in about 2-3 days   Increase activity slowly  As directed        Medication List         acetaminophen 500 MG tablet  Commonly known as:  TYLENOL  Take 500 mg by mouth every 6 (six) hours as needed for pain (for pain in leg).     aspirin EC 81 MG tablet  Take 81 mg by mouth daily.     dextromethorphan 30 MG/5ML liquid  Commonly known as:  DELSYM  Take 30 mg by mouth as needed for cough.     furosemide 40 MG tablet  Commonly known as:  LASIX  Take 1 tablet (40 mg total) by mouth 2 (two) times daily.       Allergies  Allergen Reactions  . Actos [Pioglitazone] Other (See Comments)    May have dropped blood sugar too much-family unsure       Follow-up Information   Schedule an appointment as soon as possible for a visit with Thora Lance, MD.   Specialty:  Family Medicine   Contact information:   [address], [address] [place] [address] [phone]       Follow up with Tobias Alexander, H, MD. Schedule an appointment as soon as possible for a visit in 1 week.   Specialty:  Cardiology   Contact information:   [address] [place] [address] [phone]        The results of significant diagnostics from this hospitalization (including imaging, microbiology, ancillary and laboratory) are listed below for reference.    Significant Diagnostic Studies: Dg Chest 1 View  08/27/2013   CLINICAL DATA:  Post left thoracentesis. CHF.  EXAM: CHEST - 1 VIEW  COMPARISON:  08/26/2013  FINDINGS: The left pleural effusion has nearly resolved. Few densities remaining at the left lung base could represent residual pleural fluid or atelectasis. Again noted are multiple calcifications throughout the right side of the chest. The heart mediastinum are stable. No evidence for a pneumothorax.  IMPRESSION: Improved aeration in the left lung after left thoracentesis. No significant pleural fluid is remaining. Negative for a  pneumothorax.  Chronic changes in the right hemithorax.   Electronically Signed   By: Richarda Overlie M.D.   On: 08/27/2013 14:13   Dg Chest 2 View  08/26/2013   CLINICAL DATA:  Shortness of breath, cough, history hypertension, diabetes, CHF, MI, chronic kidney disease  EXAM: CHEST  2 VIEW  COMPARISON:  06/25/2013  FINDINGS: Enlargement of cardiac silhouette.  Atherosclerotic calcification aorta.  Large left pleural effusion new since previous exam with associated left basilar atelectasis.  Underlying emphysematous changes in left lung.  Right lung demonstrates extensive pleural and parenchymal calcification/ fibrothorax appearance unchanged from previous exam.  Volume loss in right chest with mediastinal shift to right.  No pneumothorax.  Bones diffusely demineralized.  IMPRESSION: Chronic right fibrothorax with volume loss.  Emphysematous changes of the left lung with new large left pleural effusion with associated left basilar atelectasis.  Patient did have a large left pleural effusion on earlier study of 06/25/2013 and had undergone left thoracentesis.   Electronically Signed   By: Ulyses Southward M.D.   On: 08/26/2013 23:16   US Thoracentesis Asp Pleural Space W/img Guide  08/27/2013  CLINICAL DATA:  Acute on chronic congestive heart failure, left-sided pleural effusion, dyspnea. Request diagnostic and therapeutic thoracentesis.  EXAM: ULTRASOUND GUIDED left THORACENTESIS  COMPARISON:  None  FINDINGS: A total of approximately 1.5 L of clear yellow fluid was removed. A fluid sample wassent for laboratory analysis.  IMPRESSION: Successful ultrasound guided left thoracentesis yielding 1.5 L of pleural fluid.  Read by: Brayton El PA-C  PROCEDURE: An ultrasound guided thoracentesis was thoroughly discussed with the patient and questions answered. The benefits, risks, alternatives and complications were also discussed. The patient understands and wishes to proceed with the procedure. Written consent was obtained.   Ultrasound was performed to localize and mark an adequate pocket of fluid in the left chest. The area was then prepped and draped in the normal sterile fashion. 1% Lidocaine was used for local anesthesia. Under ultrasound guidance a 19 gauge Yueh catheter was introduced. Thoracentesis was performed. The catheter was removed and a dressing applied.  Complications:  None immediate.   Electronically Signed   By: Richarda Overlie M.D.   On: 08/27/2013 13:51    Microbiology: Recent Results (from the past 240 hour(s))  GRAM STAIN     Status: None   Collection Time    08/27/13  1:48 PM      Result Value Range Status   Specimen Description FLUID LEFT PLEURAL   Final   Special Requests NONE   Final   Gram Stain     Final   Value: CYTOSPIN SLIDE     WBC PRESENT,BOTH PMN AND MONONUCLEAR     NO ORGANISMS SEEN   Report Status 08/27/2013 FINAL   Final  BODY FLUID CULTURE     Status: None   Collection Time    08/27/13  1:48 PM      Result Value Range Status   Specimen Description FLUID LEFT PLEURAL   Final   Special Requests NONE   Final   Gram Stain     Final   Value: CYTOSPIN WBC PRESENT,BOTH PMN AND MONONUCLEAR     NO ORGANISMS SEEN     Performed at [facility]     Performed at [facility]   Culture     Final   Value: NO GROWTH 1 DAY     Performed at [facility]   Report Status PENDING   Incomplete     Labs: Basic Metabolic Panel:  Recent Labs Lab 08/26/13 2230 08/27/13 0250 08/27/13 1520 08/28/13 1000 08/29/13 0450  NA 140 139 137 136 138  K 5.1 4.9 5.7* 4.3 4.3  CL 103 104 101 100 101  CO2 27 25 24 28 29   GLUCOSE 140* 114* 143* 168* 89  BUN 41* 39* 38* 38* 39*  CREATININE 1.62* 1.57* 1.60* 1.66* 1.69*  CALCIUM 8.8 8.6 8.5 8.4 8.3*  MG  --  2.2  --   --   --   PHOS  --  5.1*  --   --   --    Liver Function Tests:  Recent Labs Lab 08/26/13 2230 08/27/13 1520  AST 12 20  ALT <5 <5  ALKPHOS 71 53  BILITOT 0.4 0.4  PROT 7.9 7.2  ALBUMIN 3.5 3.0*    No results found for this basename: LIPASE, AMYLASE,  in the last 168 hours No results found for this basename: AMMONIA,  in the last 168 hours CBC:  Recent Labs Lab 08/26/13 2230 08/27/13 0250  WBC 9.2 9.4  NEUTROABS 7.6  --  HGB 11.3* 10.6*  HCT 35.6* 33.0*  MCV 89.9 89.9  PLT 187 158   Cardiac Enzymes:  Recent Labs Lab 08/27/13 0250 08/27/13 0812 08/27/13 1437  TROPONINI <0.30 <0.30 <0.30   BNP: BNP (last 3 results)  Recent Labs  04/12/13 0320 06/25/13 0914 08/26/13 2230  PROBNP 10720.0* 17428.0* 31531.0*   CBG:  Recent Labs Lab 08/27/13 2143 08/28/13 0648 08/28/13 2110 08/29/13 0518 08/29/13 0749  GLUCAP 72 93 112* 81 92       Signed:  Willard Farquharson, JAI-GURMUKH  [facility] 08/29/2013, 8:08 AM

## 2013-08-29 NOTE — Progress Notes (Signed)
PT Cancellation Note  Patient Details Name: Alyssa Knight MRN: [id number] DOB: November 13, 1916   Cancelled Treatment:    Reason Eval/Treat Not Completed: Fatigue/lethargy limiting ability to participate (pt had just ambulated with RN and denied mobility at this time)   Delorse Lek 08/29/2013, 9:52 AM Delaney Meigs, PT [phone]

## 2013-08-29 NOTE — Progress Notes (Signed)
Pt d/c home with son. D/c instructions and medications reviewed witH Pt and son. Pt and son state understanding. All Pt and son questions answered

## 2013-08-29 NOTE — Plan of Care (Signed)
Problem: Phase I Progression Outcomes Goal: EF % per last Echo/documented,Core Reminder form on chart 15-20%

## 2013-08-29 NOTE — ED Provider Notes (Signed)
Medical screening examination/treatment/procedure(s) were conducted as a shared visit with non-physician practitioner(s) and myself.  I personally evaluated the patient during the encounter.  EKG Interpretation     Ventricular Rate:  105 PR Interval:  170 QRS Duration: 70 QT Interval:  310 QTC Calculation: 409 R Axis:   -47 Text Interpretation:  Sinus tachycardia with Premature supraventricular complexes and Premature ventricular complexes or Fusion complexes Left axis deviation Anteroseptal infarct , age undetermined Abnormal ECG ED PHYSICIAN INTERPRETATION AVAILABLE IN [facility]            Patient seen by me. Patient with history of CHF. Patient had a significant pleural effusion in August that required thoracentesis and drainage. Patient presents again with shortness of breath. Chest x-ray shows a recurrent large pleural effusion. Patient will require admission most likely will require drainage of the pleural effusion again. Patient also with a mild exacerbation of her CHF. Patient will be admitted to telemetry. Patient with decreased breath sounds on the left side.  Results for orders placed during the hospital encounter of 08/26/13  GRAM STAIN      Result Value Range   Specimen Description FLUID LEFT PLEURAL     Special Requests NONE     Gram Stain       Value: CYTOSPIN SLIDE     WBC PRESENT,BOTH PMN AND MONONUCLEAR     NO ORGANISMS SEEN   Report Status 08/27/2013 FINAL    BODY FLUID CULTURE      Result Value Range   Specimen Description FLUID LEFT PLEURAL     Special Requests NONE     Gram Stain       Value: CYTOSPIN WBC PRESENT,BOTH PMN AND MONONUCLEAR     NO ORGANISMS SEEN     Performed at [facility]     Performed at [facility]   Culture       Value: NO GROWTH 1 DAY     Performed at [facility]   Report Status PENDING    PRO B NATRIURETIC PEPTIDE      Result Value Range   Pro B Natriuretic peptide (BNP) 31531.0 (*) 0 - 450  pg/mL  CBC WITH DIFFERENTIAL      Result Value Range   WBC 9.2  4.0 - 10.5 K/uL   RBC 3.96  3.87 - 5.11 MIL/uL   Hemoglobin 11.3 (*) 12.0 - 15.0 g/dL   HCT 82.9 (*) 56.2 - 13.0 %   MCV 89.9  78.0 - 100.0 fL   MCH 28.5  26.0 - 34.0 pg   MCHC 31.7  30.0 - 36.0 g/dL   RDW 86.5 (*) 78.4 - 69.6 %   Platelets 187  150 - 400 K/uL   Neutrophils Relative % 82 (*) 43 - 77 %   Neutro Abs 7.6  1.7 - 7.7 K/uL   Lymphocytes Relative 9 (*) 12 - 46 %   Lymphs Abs 0.8  0.7 - 4.0 K/uL   Monocytes Relative 8  3 - 12 %   Monocytes Absolute 0.7  0.1 - 1.0 K/uL   Eosinophils Relative 1  0 - 5 %   Eosinophils Absolute 0.1  0.0 - 0.7 K/uL   Basophils Relative 0  0 - 1 %   Basophils Absolute 0.0  0.0 - 0.1 K/uL  COMPREHENSIVE METABOLIC PANEL      Result Value Range   Sodium 140  135 - 145 mEq/L   Potassium 5.1  3.5 - 5.1 mEq/L  Chloride 103  96 - 112 mEq/L   CO2 27  19 - 32 mEq/L   Glucose, Bld 140 (*) 70 - 99 mg/dL   BUN 41 (*) 6 - 23 mg/dL   Creatinine, Ser 1.61 (*) 0.50 - 1.10 mg/dL   Calcium 8.8  8.4 - 09.6 mg/dL   Total Protein 7.9  6.0 - 8.3 g/dL   Albumin 3.5  3.5 - 5.2 g/dL   AST 12  0 - 37 U/L   ALT <5  0 - 35 U/L   Alkaline Phosphatase 71  39 - 117 U/L   Total Bilirubin 0.4  0.3 - 1.2 mg/dL   GFR calc non Af Amer 26 (*) >90 mL/min   GFR calc Af Amer 30 (*) >90 mL/min  MAGNESIUM      Result Value Range   Magnesium 2.2  1.5 - 2.5 mg/dL  PHOSPHORUS      Result Value Range   Phosphorus 5.1 (*) 2.3 - 4.6 mg/dL  TSH      Result Value Range   TSH 3.617  0.350 - 4.500 uIU/mL  TROPONIN I      Result Value Range   Troponin I <0.30  <0.30 ng/mL  TROPONIN I      Result Value Range   Troponin I <0.30  <0.30 ng/mL  TROPONIN I      Result Value Range   Troponin I <0.30  <0.30 ng/mL  BASIC METABOLIC PANEL      Result Value Range   Sodium 139  135 - 145 mEq/L   Potassium 4.9  3.5 - 5.1 mEq/L   Chloride 104  96 - 112 mEq/L   CO2 25  19 - 32 mEq/L   Glucose, Bld 114 (*) 70 - 99 mg/dL    BUN 39 (*) 6 - 23 mg/dL   Creatinine, Ser 0.45 (*) 0.50 - 1.10 mg/dL   Calcium 8.6  8.4 - 40.9 mg/dL   GFR calc non Af Amer 27 (*) >90 mL/min   GFR calc Af Amer 31 (*) >90 mL/min  CBC      Result Value Range   WBC 9.4  4.0 - 10.5 K/uL   RBC 3.67 (*) 3.87 - 5.11 MIL/uL   Hemoglobin 10.6 (*) 12.0 - 15.0 g/dL   HCT 81.1 (*) 91.4 - 78.2 %   MCV 89.9  78.0 - 100.0 fL   MCH 28.9  26.0 - 34.0 pg   MCHC 32.1  30.0 - 36.0 g/dL   RDW 95.6 (*) 21.3 - 08.6 %   Platelets 158  150 - 400 K/uL  GLUCOSE, CAPILLARY      Result Value Range   Glucose-Capillary 102 (*) 70 - 99 mg/dL  GLUCOSE, CAPILLARY      Result Value Range   Glucose-Capillary 102 (*) 70 - 99 mg/dL   Comment 1 Notify RN     Comment 2 Documented in Chart    LACTATE DEHYDROGENASE, BODY FLUID      Result Value Range   LD, Fluid 6  3 - 23 U/L   Fluid Type-FLDH FLUID    BODY FLUID CELL COUNT WITH DIFFERENTIAL      Result Value Range   Fluid Type-FCT FLUID     Color, Fluid YELLOW  YELLOW   Appearance, Fluid CLEAR  CLEAR   WBC, Fluid 414  0 - 1000 cu mm   Neutrophil Count, Fluid 50 (*) 0 - 25 %   Lymphs, Fluid 22     Monocyte-Macrophage-Serous  Fluid 28 (*) 50 - 90 %   Other Cells, Fluid RARE MESOTHELIAL CELL PRESENT    PROTEIN, BODY FLUID      Result Value Range   Total protein, fluid 2.7     Fluid Type-FTP THORACENTESIS    LACTATE DEHYDROGENASE      Result Value Range   LDH 499 (*) 94 - 250 U/L  COMPREHENSIVE METABOLIC PANEL      Result Value Range   Sodium 137  135 - 145 mEq/L   Potassium 5.7 (*) 3.5 - 5.1 mEq/L   Chloride 101  96 - 112 mEq/L   CO2 24  19 - 32 mEq/L   Glucose, Bld 143 (*) 70 - 99 mg/dL   BUN 38 (*) 6 - 23 mg/dL   Creatinine, Ser 1.61 (*) 0.50 - 1.10 mg/dL   Calcium 8.5  8.4 - 09.6 mg/dL   Total Protein 7.2  6.0 - 8.3 g/dL   Albumin 3.0 (*) 3.5 - 5.2 g/dL   AST 20  0 - 37 U/L   ALT <5  0 - 35 U/L   Alkaline Phosphatase 53  39 - 117 U/L   Total Bilirubin 0.4  0.3 - 1.2 mg/dL   GFR calc non Af  Amer 26 (*) >90 mL/min   GFR calc Af Amer 31 (*) >90 mL/min  GLUCOSE, CAPILLARY      Result Value Range   Glucose-Capillary 133 (*) 70 - 99 mg/dL   Comment 1 Notify RN     Comment 2 Documented in Chart    GLUCOSE, CAPILLARY      Result Value Range   Glucose-Capillary 72  70 - 99 mg/dL   Comment 1 Notify RN    GLUCOSE, CAPILLARY      Result Value Range   Glucose-Capillary 93  70 - 99 mg/dL  BASIC METABOLIC PANEL      Result Value Range   Sodium 136  135 - 145 mEq/L   Potassium 4.3  3.5 - 5.1 mEq/L   Chloride 100  96 - 112 mEq/L   CO2 28  19 - 32 mEq/L   Glucose, Bld 168 (*) 70 - 99 mg/dL   BUN 38 (*) 6 - 23 mg/dL   Creatinine, Ser 0.45 (*) 0.50 - 1.10 mg/dL   Calcium 8.4  8.4 - 40.9 mg/dL   GFR calc non Af Amer 25 (*) >90 mL/min   GFR calc Af Amer 29 (*) >90 mL/min  BASIC METABOLIC PANEL      Result Value Range   Sodium 138  135 - 145 mEq/L   Potassium 4.3  3.5 - 5.1 mEq/L   Chloride 101  96 - 112 mEq/L   CO2 29  19 - 32 mEq/L   Glucose, Bld 89  70 - 99 mg/dL   BUN 39 (*) 6 - 23 mg/dL   Creatinine, Ser 8.11 (*) 0.50 - 1.10 mg/dL   Calcium 8.3 (*) 8.4 - 10.5 mg/dL   GFR calc non Af Amer 25 (*) >90 mL/min   GFR calc Af Amer 29 (*) >90 mL/min  GLUCOSE, CAPILLARY      Result Value Range   Glucose-Capillary 112 (*) 70 - 99 mg/dL   Comment 1 Notify RN    GLUCOSE, CAPILLARY      Result Value Range   Glucose-Capillary 81  70 - 99 mg/dL   Dg Chest 1 View  91/01/7828   CLINICAL DATA:  Post left thoracentesis. CHF.  EXAM: CHEST - 1 VIEW  COMPARISON:  08/26/2013  FINDINGS: The left pleural effusion has nearly resolved. Few densities remaining at the left lung base could represent residual pleural fluid or atelectasis. Again noted are multiple calcifications throughout the right side of the chest. The heart mediastinum are stable. No evidence for a pneumothorax.  IMPRESSION: Improved aeration in the left lung after left thoracentesis. No significant pleural fluid is remaining.  Negative for a pneumothorax.  Chronic changes in the right hemithorax.   Electronically Signed   By: Richarda Overlie M.D.   On: 08/27/2013 14:13   Dg Chest 2 View  08/26/2013   CLINICAL DATA:  Shortness of breath, cough, history hypertension, diabetes, CHF, MI, chronic kidney disease  EXAM: CHEST  2 VIEW  COMPARISON:  06/25/2013  FINDINGS: Enlargement of cardiac silhouette.  Atherosclerotic calcification aorta.  Large left pleural effusion new since previous exam with associated left basilar atelectasis.  Underlying emphysematous changes in left lung.  Right lung demonstrates extensive pleural and parenchymal calcification/ fibrothorax appearance unchanged from previous exam.  Volume loss in right chest with mediastinal shift to right.  No pneumothorax.  Bones diffusely demineralized.  IMPRESSION: Chronic right fibrothorax with volume loss.  Emphysematous changes of the left lung with new large left pleural effusion with associated left basilar atelectasis.  Patient did have a large left pleural effusion on earlier study of 06/25/2013 and had undergone left thoracentesis.   Electronically Signed   By: Ulyses Southward M.D.   On: 08/26/2013 23:16   US Thoracentesis Asp Pleural Space W/img Guide  08/27/2013   CLINICAL DATA:  Acute on chronic congestive heart failure, left-sided pleural effusion, dyspnea. Request diagnostic and therapeutic thoracentesis.  EXAM: ULTRASOUND GUIDED left THORACENTESIS  COMPARISON:  None  FINDINGS: A total of approximately 1.5 L of clear yellow fluid was removed. A fluid sample wassent for laboratory analysis.  IMPRESSION: Successful ultrasound guided left thoracentesis yielding 1.5 L of pleural fluid.  Read by: Brayton El PA-C  PROCEDURE: An ultrasound guided thoracentesis was thoroughly discussed with the patient and questions answered. The benefits, risks, alternatives and complications were also discussed. The patient understands and wishes to proceed with the procedure. Written consent  was obtained.  Ultrasound was performed to localize and mark an adequate pocket of fluid in the left chest. The area was then prepped and draped in the normal sterile fashion. 1% Lidocaine was used for local anesthesia. Under ultrasound guidance a 19 gauge Yueh catheter was introduced. Thoracentesis was performed. The catheter was removed and a dressing applied.  Complications:  None immediate.   Electronically Signed   By: Richarda Overlie M.D.   On: 08/27/2013 13:51    Results for orders placed during the hospital encounter of 08/26/13  GRAM STAIN      Result Value Range   Specimen Description FLUID LEFT PLEURAL     Special Requests NONE     Gram Stain       Value: CYTOSPIN SLIDE     WBC PRESENT,BOTH PMN AND MONONUCLEAR     NO ORGANISMS SEEN   Report Status 08/27/2013 FINAL    BODY FLUID CULTURE      Result Value Range   Specimen Description FLUID LEFT PLEURAL     Special Requests NONE     Gram Stain       Value: CYTOSPIN WBC PRESENT,BOTH PMN AND MONONUCLEAR     NO ORGANISMS SEEN     Performed at [facility]     Performed at [facility]  Culture       Value: NO GROWTH 1 DAY     Performed at [facility]   Report Status PENDING    PRO B NATRIURETIC PEPTIDE      Result Value Range   Pro B Natriuretic peptide (BNP) 31531.0 (*) 0 - 450 pg/mL  CBC WITH DIFFERENTIAL      Result Value Range   WBC 9.2  4.0 - 10.5 K/uL   RBC 3.96  3.87 - 5.11 MIL/uL   Hemoglobin 11.3 (*) 12.0 - 15.0 g/dL   HCT 16.1 (*) 09.6 - 04.5 %   MCV 89.9  78.0 - 100.0 fL   MCH 28.5  26.0 - 34.0 pg   MCHC 31.7  30.0 - 36.0 g/dL   RDW 40.9 (*) 81.1 - 91.4 %   Platelets 187  150 - 400 K/uL   Neutrophils Relative % 82 (*) 43 - 77 %   Neutro Abs 7.6  1.7 - 7.7 K/uL   Lymphocytes Relative 9 (*) 12 - 46 %   Lymphs Abs 0.8  0.7 - 4.0 K/uL   Monocytes Relative 8  3 - 12 %   Monocytes Absolute 0.7  0.1 - 1.0 K/uL   Eosinophils Relative 1  0 - 5 %   Eosinophils Absolute 0.1  0.0 - 0.7 K/uL    Basophils Relative 0  0 - 1 %   Basophils Absolute 0.0  0.0 - 0.1 K/uL  COMPREHENSIVE METABOLIC PANEL      Result Value Range   Sodium 140  135 - 145 mEq/L   Potassium 5.1  3.5 - 5.1 mEq/L   Chloride 103  96 - 112 mEq/L   CO2 27  19 - 32 mEq/L   Glucose, Bld 140 (*) 70 - 99 mg/dL   BUN 41 (*) 6 - 23 mg/dL   Creatinine, Ser 7.82 (*) 0.50 - 1.10 mg/dL   Calcium 8.8  8.4 - 95.6 mg/dL   Total Protein 7.9  6.0 - 8.3 g/dL   Albumin 3.5  3.5 - 5.2 g/dL   AST 12  0 - 37 U/L   ALT <5  0 - 35 U/L   Alkaline Phosphatase 71  39 - 117 U/L   Total Bilirubin 0.4  0.3 - 1.2 mg/dL   GFR calc non Af Amer 26 (*) >90 mL/min   GFR calc Af Amer 30 (*) >90 mL/min  MAGNESIUM      Result Value Range   Magnesium 2.2  1.5 - 2.5 mg/dL  PHOSPHORUS      Result Value Range   Phosphorus 5.1 (*) 2.3 - 4.6 mg/dL  TSH      Result Value Range   TSH 3.617  0.350 - 4.500 uIU/mL  TROPONIN I      Result Value Range   Troponin I <0.30  <0.30 ng/mL  TROPONIN I      Result Value Range   Troponin I <0.30  <0.30 ng/mL  TROPONIN I      Result Value Range   Troponin I <0.30  <0.30 ng/mL  BASIC METABOLIC PANEL      Result Value Range   Sodium 139  135 - 145 mEq/L   Potassium 4.9  3.5 - 5.1 mEq/L   Chloride 104  96 - 112 mEq/L   CO2 25  19 - 32 mEq/L   Glucose, Bld 114 (*) 70 - 99 mg/dL   BUN 39 (*) 6 - 23 mg/dL   Creatinine, Ser 2.13 (*)  0.50 - 1.10 mg/dL   Calcium 8.6  8.4 - 16.1 mg/dL   GFR calc non Af Amer 27 (*) >90 mL/min   GFR calc Af Amer 31 (*) >90 mL/min  CBC      Result Value Range   WBC 9.4  4.0 - 10.5 K/uL   RBC 3.67 (*) 3.87 - 5.11 MIL/uL   Hemoglobin 10.6 (*) 12.0 - 15.0 g/dL   HCT 09.6 (*) 04.5 - 40.9 %   MCV 89.9  78.0 - 100.0 fL   MCH 28.9  26.0 - 34.0 pg   MCHC 32.1  30.0 - 36.0 g/dL   RDW 81.1 (*) 91.4 - 78.2 %   Platelets 158  150 - 400 K/uL  GLUCOSE, CAPILLARY      Result Value Range   Glucose-Capillary 102 (*) 70 - 99 mg/dL  GLUCOSE, CAPILLARY      Result Value Range    Glucose-Capillary 102 (*) 70 - 99 mg/dL   Comment 1 Notify RN     Comment 2 Documented in Chart    LACTATE DEHYDROGENASE, BODY FLUID      Result Value Range   LD, Fluid 6  3 - 23 U/L   Fluid Type-FLDH FLUID    BODY FLUID CELL COUNT WITH DIFFERENTIAL      Result Value Range   Fluid Type-FCT FLUID     Color, Fluid YELLOW  YELLOW   Appearance, Fluid CLEAR  CLEAR   WBC, Fluid 414  0 - 1000 cu mm   Neutrophil Count, Fluid 50 (*) 0 - 25 %   Lymphs, Fluid 22     Monocyte-Macrophage-Serous Fluid 28 (*) 50 - 90 %   Other Cells, Fluid RARE MESOTHELIAL CELL PRESENT    PROTEIN, BODY FLUID      Result Value Range   Total protein, fluid 2.7     Fluid Type-FTP THORACENTESIS    LACTATE DEHYDROGENASE      Result Value Range   LDH 499 (*) 94 - 250 U/L  COMPREHENSIVE METABOLIC PANEL      Result Value Range   Sodium 137  135 - 145 mEq/L   Potassium 5.7 (*) 3.5 - 5.1 mEq/L   Chloride 101  96 - 112 mEq/L   CO2 24  19 - 32 mEq/L   Glucose, Bld 143 (*) 70 - 99 mg/dL   BUN 38 (*) 6 - 23 mg/dL   Creatinine, Ser 9.56 (*) 0.50 - 1.10 mg/dL   Calcium 8.5  8.4 - 21.3 mg/dL   Total Protein 7.2  6.0 - 8.3 g/dL   Albumin 3.0 (*) 3.5 - 5.2 g/dL   AST 20  0 - 37 U/L   ALT <5  0 - 35 U/L   Alkaline Phosphatase 53  39 - 117 U/L   Total Bilirubin 0.4  0.3 - 1.2 mg/dL   GFR calc non Af Amer 26 (*) >90 mL/min   GFR calc Af Amer 31 (*) >90 mL/min  GLUCOSE, CAPILLARY      Result Value Range   Glucose-Capillary 133 (*) 70 - 99 mg/dL   Comment 1 Notify RN     Comment 2 Documented in Chart    GLUCOSE, CAPILLARY      Result Value Range   Glucose-Capillary 72  70 - 99 mg/dL   Comment 1 Notify RN    GLUCOSE, CAPILLARY      Result Value Range   Glucose-Capillary 93  70 - 99 mg/dL  BASIC METABOLIC PANEL  Result Value Range   Sodium 136  135 - 145 mEq/L   Potassium 4.3  3.5 - 5.1 mEq/L   Chloride 100  96 - 112 mEq/L   CO2 28  19 - 32 mEq/L   Glucose, Bld 168 (*) 70 - 99 mg/dL   BUN 38 (*) 6 - 23 mg/dL    Creatinine, Ser 6.57 (*) 0.50 - 1.10 mg/dL   Calcium 8.4  8.4 - 84.6 mg/dL   GFR calc non Af Amer 25 (*) >90 mL/min   GFR calc Af Amer 29 (*) >90 mL/min  BASIC METABOLIC PANEL      Result Value Range   Sodium 138  135 - 145 mEq/L   Potassium 4.3  3.5 - 5.1 mEq/L   Chloride 101  96 - 112 mEq/L   CO2 29  19 - 32 mEq/L   Glucose, Bld 89  70 - 99 mg/dL   BUN 39 (*) 6 - 23 mg/dL   Creatinine, Ser 9.62 (*) 0.50 - 1.10 mg/dL   Calcium 8.3 (*) 8.4 - 10.5 mg/dL   GFR calc non Af Amer 25 (*) >90 mL/min   GFR calc Af Amer 29 (*) >90 mL/min  GLUCOSE, CAPILLARY      Result Value Range   Glucose-Capillary 112 (*) 70 - 99 mg/dL   Comment 1 Notify RN    GLUCOSE, CAPILLARY      Result Value Range   Glucose-Capillary 81  70 - 99 mg/dL     .  Shelda Jakes, MD 08/29/13 [phone]

## 2013-08-29 NOTE — Progress Notes (Addendum)
Pt O3x, no complaints of pain or sob. Pt O2 stat when ambulating 90-91%. Will continue to monitor

## 2013-08-30 LAB — BODY FLUID CULTURE

## 2013-09-01 ENCOUNTER — Encounter: Payer: Medicare Other | Admitting: Cardiology

## 2013-09-02 ENCOUNTER — Encounter: Payer: Self-pay | Admitting: Nurse Practitioner

## 2013-09-02 ENCOUNTER — Ambulatory Visit (INDEPENDENT_AMBULATORY_CARE_PROVIDER_SITE_OTHER): Payer: Medicare Other | Admitting: Nurse Practitioner

## 2013-09-02 VITALS — BP 120/58 | HR 68 | Ht 67.0 in | Wt 137.0 lb

## 2013-09-02 DIAGNOSIS — R0989 Other specified symptoms and signs involving the circulatory and respiratory systems: Secondary | ICD-10-CM

## 2013-09-02 DIAGNOSIS — R0600 Dyspnea, unspecified: Secondary | ICD-10-CM

## 2013-09-02 DIAGNOSIS — I5041 Acute combined systolic (congestive) and diastolic (congestive) heart failure: Secondary | ICD-10-CM

## 2013-09-02 DIAGNOSIS — R0609 Other forms of dyspnea: Secondary | ICD-10-CM

## 2013-09-02 DIAGNOSIS — N189 Chronic kidney disease, unspecified: Secondary | ICD-10-CM

## 2013-09-02 LAB — BASIC METABOLIC PANEL
BUN: 46 mg/dL — ABNORMAL HIGH (ref 6–23)
CO2: 34 mEq/L — ABNORMAL HIGH (ref 19–32)
Calcium: 8.7 mg/dL (ref 8.4–10.5)
Chloride: 94 mEq/L — ABNORMAL LOW (ref 96–112)
Creatinine, Ser: 2 mg/dL — ABNORMAL HIGH (ref 0.4–1.2)
GFR: 30.68 mL/min — ABNORMAL LOW (ref >60.00)
Glucose, Bld: 159 mg/dL — ABNORMAL HIGH (ref 70–99)
Potassium: 4.6 mEq/L (ref 3.5–5.1)
Sodium: 136 mEq/L (ref 135–145)

## 2013-09-02 LAB — BRAIN NATRIURETIC PEPTIDE: Pro B Natriuretic peptide (BNP): 1779 pg/mL — ABNORMAL HIGH (ref 0.0–100.0)

## 2013-09-02 MED ORDER — CARVEDILOL 3.125 MG PO TABS: 3.1250 mg | ORAL_TABLET | Freq: Every day | ORAL | Status: DC

## 2013-09-02 MED ORDER — FUROSEMIDE 40 MG PO TABS: 40.0000 mg | ORAL_TABLET | Freq: Every day | ORAL | Status: DC

## 2013-09-02 NOTE — Patient Instructions (Signed)
Continue with your current medicines but I am cutting the Lasix back to just one a day (40mg )  Weigh yourself each morning and record.  Take extra dose of diuretic for weight gain of 3 pounds in 24 hours.  We will check labs today  Keep restricting the salt  We will try adding Coreg 3.125 mg just once a day - at bedtime  I will see you in about 10 days - on day with Dr. Delton See  Call the [facility] office at [phone] if you have any questions, problems or concerns.

## 2013-09-02 NOTE — Progress Notes (Addendum)
Lyman Bishop Date of Birth: 11/06/1916 Medical Record #[id number]  History of Present Illness: Ms. Comrie is seen back today for a post hospital visit. Reportedly seen for Dr. Delton See (but the patient has NEVER formally been seen by cardiology). She is a 79 year old female with combined systolic and diastolic dysfunction, progressive CKD, history of TB, HTN, type 2 DM, normocytic anemia, left toe infection/dry gangrene, moderate to severe malnutrition and left pleural effusion. Previously on ACE, beta blocker, Bidil but stopped due to past history of hypotension.   Most recently admitted last weekend with dyspnea, orthopnea and a BNP over 30,000. Apparently noted that she has been noncompliant on her Lasix at home. Had therapeutic thoracentesis - draining 1.5 liters of fluid (third tap). EF now down to 15 to 20%. Her CXR showed calcifications on the right as well as retraction and tracheal shift towards the right - to be followed as an outpatient.   Comes back today. Here with her family. They are very supportive. She is doing better. Not really short of breath. Weight is staying down. Not dizzy or lightheaded. No chest pain. Swelling has improved. Says she can no "longer find the salt shaker". Family is overseeing her care. She is doing some very light housekeeping chores. Overall, has improve. Saw Dr. Manus Gunning yesterday.   Current Outpatient Prescriptions  Medication Sig Dispense Refill  . acetaminophen (TYLENOL) 500 MG tablet Take 500 mg by mouth every 6 (six) hours as needed for pain (for pain in leg).      Marland Kitchen aspirin EC 81 MG tablet Take 81 mg by mouth daily.      Marland Kitchen dextromethorphan (DELSYM) 30 MG/5ML liquid Take 30 mg by mouth as needed for cough.      . furosemide (LASIX) 40 MG tablet Take 1 tablet (40 mg total) by mouth 2 (two) times daily.  20 tablet  0   No current facility-administered medications for this visit.    Allergies  Allergen Reactions  . Actos [Pioglitazone] Other (See  Comments)    May have dropped blood sugar too much-family unsure    Past Medical History  Diagnosis Date  . Diabetes mellitus without complication   . HTN (hypertension)   . CKD (chronic kidney disease)   . Systolic CHF   . CHF (congestive heart failure)     Past Surgical History  Procedure Laterality Date  . Femur fracture surgery Left 03/2013    L distal femur lateral condyle s/p ORIF  . Orif femur fracture Left 04/11/2013    Procedure: OPEN REDUCTION INTERNAL FIXATION (ORIF) DISTAL FEMUR FRACTURE- left;  Surgeon: Senaida Lange, MD;  Location: [facility] OR;  Service: Orthopedics;  Laterality: Left;    History  Smoking status  . Never Smoker   Smokeless tobacco  . Never Used    History  Alcohol Use  . Yes    No family history on file.  Review of Systems: The review of systems is per the HPI.  All other systems were reviewed and are negative.  Physical Exam: Ht 5\' 7"  (1.702 m)  Wt 137 lb (62.143 kg)  BMI 21.45 kg/m2 Patient is very pleasant and in no acute distress. Skin is warm and dry. Color is normal.  HEENT is unremarkable but with missing teeth. Normocephalic/atraumatic. PERRL. Sclera are nonicteric. Neck is supple. No masses. No JVD. Lungs are clear. Cardiac exam shows a regular rate and rhythm. Little fast on my exam. Few ectopics. Abdomen is soft. Extremities are without  edema. Gait not tested = she is in a wheelchair.  No gross neurologic deficits noted.  Wt Readings from Last 3 Encounters:  09/02/13 137 lb (62.143 kg)  08/29/13 138 lb 12.8 oz (62.959 kg)  06/27/13 145 lb 8.1 oz (66 kg)    LABORATORY DATA: EKG today shows sinus rhythm - rate of 96 - with PACs - anteroseptal infarct.   Lab Results  Component Value Date   CALCIUM 8.3* 08/29/2013   CAION 1.09* 06/28/2010   PHOS 5.1* 08/27/2013     Chemistry      Component Value Date/Time   NA 138 08/29/2013 0450   K 4.3 08/29/2013 0450   CL 101 08/29/2013 0450   CO2 29 08/29/2013 0450   BUN 39* 08/29/2013 0450    CREATININE 1.69* 08/29/2013 0450      Component Value Date/Time   CALCIUM 8.3* 08/29/2013 0450   ALKPHOS 53 08/27/2013 1520   AST 20 08/27/2013 1520   ALT <5 08/27/2013 1520   BILITOT 0.4 08/27/2013 1520     Lab Results  Component Value Date   WBC 9.4 08/27/2013   HGB 10.6* 08/27/2013   HCT 33.0* 08/27/2013   MCV 89.9 08/27/2013   PLT 158 08/27/2013    Echo Study Conclusions  - Left ventricle: The cavity size was normal. Systolic function was severely reduced. The estimated ejection fraction was in the range of 15% to 20%. - Mitral valve: Mild regurgitation. - Left atrium: The atrium was severely dilated. - Right ventricle: The cavity size was mildly dilated. Wall thickness was normal. Systolic function was reduced. - Right atrium: The atrium was moderately to severely dilated.    Assessment / Plan: 1. Combined systolic and diastolic HF - with EF now down to 15% - family is aware - they are really wanting just a conservative/palliative care approach - I would be in agreement with that plan. She is currently compensated and has tuned up nicely with diuretics and thoracentesis. Will cut the Lasix back to 40 mg a day. Use extra prn weight gain. Avoid salt. Try to add low dose Coreg at 3.125 mg just at night - will have to watch her BP. See her back in about 10 days when Dr. Delton See is here.  2. TB lung   3. Recurrent pleural effusion - has had total of 3 thoracentesis since June - family really not interested in pleurex cath - will manage conservatively.   4. Advanced age - has actually done pretty well to be 9 and never seen by cardiology.   Patient is agreeable to this plan and will call if any problems develop in the interim.   Rosalio Macadamia, RN, ANP-C [facility] [address] [place], [place]  [address]   Addendum:  Phone call today with the family today - they note that the systolic BP is in the 90's - patient is TOTALLY  asymptomatic. Family is worried - patient is getting up at night unsupervised - but is not dizzy or lightheaded. I cannot control her getting up at night - I would favor her staying on her current regimen given her poor EF - but will stop the Coreg. Get her follow up back with Dr. Delton See.

## 2013-09-05 ENCOUNTER — Telehealth: Payer: Self-pay | Admitting: Nurse Practitioner

## 2013-09-05 NOTE — Telephone Encounter (Signed)
Please call the patient's daughter in law, Carley Hammed, back at [phone].

## 2013-09-05 NOTE — Telephone Encounter (Signed)
S/w pt's daughter in law is uncomfortable with pt's bp being 90/47 states when pt gets up in the middle of the night afraid pt is going to pass out, Norma Fredrickson started the new medicine, coreg  at last ov is not comfortable giving the new medication when they sleep pt still get up by herself to go to bathroom and daughter has no control if pt passes out [employer]  states as long as pt is not falling, dizzy, passing out or lightheaded bp is ok being low. Daughter in law stated understood but afraid pt is going to pass out. Lawson Fiscal stated stop new medication and see Dr. Delton See in 10 days daughter in law agreeable to plan. Agreeable to also cancel Lori's appt with pt.

## 2013-09-05 NOTE — Telephone Encounter (Signed)
New Problem  Daughter called- States the pt's BP is extremely low 90/47 over the weekend... Daughter is concerned about how low BP is dropping and request clarification of perimeters as to when she is supposed to take the medications.. ie.. If the BP is low should she still take the medication?? Please advise.

## 2013-09-05 NOTE — Telephone Encounter (Signed)
Spoke with [employer]. Will forward to her for review with Lawson Fiscal.

## 2013-09-05 NOTE — Telephone Encounter (Signed)
LMTCB @11 :09am/PE

## 2013-09-13 ENCOUNTER — Ambulatory Visit: Payer: Medicare Other | Admitting: Nurse Practitioner

## 2013-09-15 ENCOUNTER — Ambulatory Visit (INDEPENDENT_AMBULATORY_CARE_PROVIDER_SITE_OTHER): Payer: Medicare Other | Admitting: Cardiology

## 2013-09-15 VITALS — BP 110/60 | HR 62 | Wt 137.0 lb

## 2013-09-15 DIAGNOSIS — I5021 Acute systolic (congestive) heart failure: Secondary | ICD-10-CM

## 2013-09-15 NOTE — Patient Instructions (Signed)
Your physician recommends that you schedule a follow-up appointment in: 1 MONTHS WITH DR. Kathrin Ruddy PROVIDER RECOMMENDS YOU GET COMPRESSION STOCKINGS  Your physician recommends that you continue on your current medications as directed. Please refer to the Current Medication list given to you today.

## 2013-09-15 NOTE — Progress Notes (Signed)
Patient ID: Alyssa Knight, female   DOB: [birth year], 79 y.o.   MRN: [id number]   Alyssa Knight Date of Birth: 08-Mar-1917 Medical Record #[id number]  History of Present Illness:  This is a second posthospital visit. She is a 79 year old female with combined systolic and diastolic dysfunction, progressive CKD, history of TB, HTN, type 2 DM, normocytic anemia, left toe infection/dry gangrene, moderate to severe malnutrition and left pleural effusion. Previously on ACE, beta blocker, Bidil but stopped due to past history of hypotension.   Most recently admitted last weekend with dyspnea, orthopnea and a BNP over 30,000. Apparently noted that she has been noncompliant on her Lasix at home. Had therapeutic thoracentesis - draining 1.5 liters of fluid (third tap). EF now down to 15 to 20%. Her CXR showed calcifications on the right as well as retraction and tracheal shift towards the right - to be followed as an outpatient.   She came two weeks ago with concern of low blood pressure. Her Lasix was decreased from 40 BID to 40 daily dose, coreg was discontinued. Today she states that she feels better. She admits to lower extremity edema but improved SOB that allows her to walk around the house.    Current Outpatient Prescriptions  Medication Sig Dispense Refill  . acetaminophen (TYLENOL) 500 MG tablet Take 500 mg by mouth every 6 (six) hours as needed for pain (for pain in leg).      Marland Kitchen aspirin EC 81 MG tablet Take 81 mg by mouth daily.      Marland Kitchen dextromethorphan (DELSYM) 30 MG/5ML liquid Take 30 mg by mouth as needed for cough.      . furosemide (LASIX) 40 MG tablet Take 1 tablet (40 mg total) by mouth daily.  60 tablet  3   No current facility-administered medications for this visit.    Allergies  Allergen Reactions  . Actos [Pioglitazone] Other (See Comments)    May have dropped blood sugar too much-family unsure    Past Medical History  Diagnosis Date  . Diabetes mellitus without complication    . HTN (hypertension)   . CKD (chronic kidney disease)   . Systolic CHF   . CHF (congestive heart failure)     Past Surgical History  Procedure Laterality Date  . Femur fracture surgery Left 03/2013    L distal femur lateral condyle s/p ORIF  . Orif femur fracture Left 04/11/2013    Procedure: OPEN REDUCTION INTERNAL FIXATION (ORIF) DISTAL FEMUR FRACTURE- left;  Surgeon: Senaida Lange, MD;  Location: [facility] OR;  Service: Orthopedics;  Laterality: Left;    History  Smoking status  . Never Smoker   Smokeless tobacco  . Never Used    History  Alcohol Use  . Yes    No family history on file.  Review of Systems: The review of systems is per the HPI.  All other systems were reviewed and are negative.  Physical Exam:  BP 110/60, HR 62 BPM Patient is very pleasant and in no acute distress. Skin is warm and dry. Color is normal.  HEENT is unremarkable but with missing teeth. Normocephalic/atraumatic. PERRL. Sclera are nonicteric. Neck is supple. No masses. No JVD. Lungs are clear. Cardiac exam shows a regular rate and rhythm. Little fast on my exam. Few ectopics. Abdomen is soft. Extremities with B/L feet + ankle pitting edema. Gait not tested = she is in a wheelchair.  No gross neurologic deficits noted.  Wt Readings from Last 3 Encounters:  09/02/13 137 lb (62.143 kg)  08/29/13 138 lb 12.8 oz (62.959 kg)  06/27/13 145 lb 8.1 oz (66 kg)    LABORATORY DATA: EKG today shows sinus rhythm - rate of 96 - with PACs - anteroseptal infarct.   Lab Results  Component Value Date   CALCIUM 8.7 09/02/2013   CAION 1.09* 06/28/2010   PHOS 5.1* 08/27/2013     Chemistry      Component Value Date/Time   NA 136 09/02/2013 0911   K 4.6 09/02/2013 0911   CL 94* 09/02/2013 0911   CO2 34* 09/02/2013 0911   BUN 46* 09/02/2013 0911   CREATININE 2.0* 09/02/2013 0911      Component Value Date/Time   CALCIUM 8.7 09/02/2013 0911   ALKPHOS 53 08/27/2013 1520   AST 20 08/27/2013 1520   ALT <5 08/27/2013 1520    BILITOT 0.4 08/27/2013 1520     Lab Results  Component Value Date   WBC 9.4 08/27/2013   HGB 10.6* 08/27/2013   HCT 33.0* 08/27/2013   MCV 89.9 08/27/2013   PLT 158 08/27/2013    Echo Study Conclusions  - Left ventricle: The cavity size was normal. Systolic function was severely reduced. The estimated ejection fraction was in the range of 15% to 20%. - Mitral valve: Mild regurgitation. - Left atrium: The atrium was severely dilated. - Right ventricle: The cavity size was mildly dilated. Wall thickness was normal. Systolic function was reduced. - Right atrium: The atrium was moderately to severely dilated.   Assessment / Plan:  1. Combined systolic and diastolic HF - with EF now down to 15% - family is aware - they are really wanting just a conservative/palliative care approach - I would be in agreement with that plan. She is currently compensated and has tuned up nicely with diuretics and thoracentesis. We will continue Lasix 40 mg PO daily. Crea was elevated at the last visit (from 1.8 to 2.0). We will check again at the next visit.  She has residual lower extremity edema , we will prescribe compression stockings.  2. TB lung - h/o   3. Recurrent pleural effusion - has had total of 3 thoracentesis since June - family really not interested in pleurex cath - will manage conservatively.   4. Advanced age - has actually done pretty well to be 63 and never seen by cardiology.   Follow up in 1 months with BMP   Tobias Alexander, H 09/15/2013

## 2013-10-14 ENCOUNTER — Encounter (INDEPENDENT_AMBULATORY_CARE_PROVIDER_SITE_OTHER): Payer: Self-pay

## 2013-10-14 ENCOUNTER — Encounter: Payer: Self-pay | Admitting: Cardiology

## 2013-10-14 ENCOUNTER — Ambulatory Visit (INDEPENDENT_AMBULATORY_CARE_PROVIDER_SITE_OTHER): Payer: Medicare Other | Admitting: Cardiology

## 2013-10-14 VITALS — BP 130/74 | HR 97 | Ht 67.0 in | Wt 135.0 lb

## 2013-10-14 DIAGNOSIS — R0602 Shortness of breath: Secondary | ICD-10-CM

## 2013-10-14 DIAGNOSIS — I10 Essential (primary) hypertension: Secondary | ICD-10-CM

## 2013-10-14 DIAGNOSIS — I5021 Acute systolic (congestive) heart failure: Secondary | ICD-10-CM

## 2013-10-14 LAB — BASIC METABOLIC PANEL
BUN: 46 mg/dL — ABNORMAL HIGH (ref 6–23)
CO2: 32 mEq/L (ref 19–32)
Calcium: 8.7 mg/dL (ref 8.4–10.5)
Chloride: 99 mEq/L (ref 96–112)
Creatinine, Ser: 1.9 mg/dL — ABNORMAL HIGH (ref 0.4–1.2)
GFR: 32.6 mL/min — ABNORMAL LOW (ref >60.00)
Glucose, Bld: 132 mg/dL — ABNORMAL HIGH (ref 70–99)
Potassium: 4.9 mEq/L (ref 3.5–5.1)
Sodium: 138 mEq/L (ref 135–145)

## 2013-10-14 LAB — BRAIN NATRIURETIC PEPTIDE: Pro B Natriuretic peptide (BNP): 1535 pg/mL — ABNORMAL HIGH (ref 0.0–100.0)

## 2013-10-14 NOTE — Patient Instructions (Addendum)
Lab work today  ( bmet,bnp )   Your physician recommends that you schedule a follow-up appointment in: 3 months  01/17/14 at 3:00 pm

## 2013-10-14 NOTE — Progress Notes (Signed)
Patient ID: Alyssa Knight, female   DOB: 10/22/1917, 79 y.o.   MRN: [id number]     Alyssa Knight Date of Birth: [birth year] Medical Record #[id number]  History of Present Illness:  This is a second posthospital visit. She is a 79 year old female with combined systolic and diastolic dysfunction, progressive CKD, history of TB, HTN, type 2 DM, normocytic anemia, left toe infection/dry gangrene, moderate to severe malnutrition and left pleural effusion. Previously on ACE, beta blocker, Bidil but stopped due to past history of hypotension.   Most recently admitted last weekend with dyspnea, orthopnea and a BNP over 30,000. Apparently noted that she has been noncompliant on her Lasix at home. Had therapeutic thoracentesis - draining 1.5 liters of fluid (third tap). EF now down to 15 to 20%. Her CXR showed calcifications on the right as well as retraction and tracheal shift towards the right - to be followed as an outpatient.   She came two weeks ago with concern of low blood pressure. Her Lasix was decreased from 40 BID to 40 daily dose, coreg was discontinued. Today she states that she feels better. She admits to lower extremity edema but improved SOB that allows her to walk around the house.   1 Month follow up - the patient feels great, she walks at home, no significant SOB and is able to get a good sleep when laying on her side. Lower extremity edema is stable. Her only complain today is a loose toenail on her right foot.   Current Outpatient Prescriptions  Medication Sig Dispense Refill  . acetaminophen (TYLENOL) 500 MG tablet Take 500 mg by mouth every 6 (six) hours as needed for pain (for pain in leg).      Marland Kitchen aspirin EC 81 MG tablet Take 81 mg by mouth daily.      Marland Kitchen dextromethorphan (DELSYM) 30 MG/5ML liquid Take 30 mg by mouth as needed for cough.      . furosemide (LASIX) 40 MG tablet Take 1 tablet (40 mg total) by mouth daily.  60 tablet  3   No current facility-administered medications  for this visit.    Allergies  Allergen Reactions  . Actos [Pioglitazone] Other (See Comments)    May have dropped blood sugar too much-family unsure    Past Medical History  Diagnosis Date  . Diabetes mellitus without complication   . HTN (hypertension)   . CKD (chronic kidney disease)   . Systolic CHF   . CHF (congestive heart failure)     Past Surgical History  Procedure Laterality Date  . Femur fracture surgery Left 03/2013    L distal femur lateral condyle s/p ORIF  . Orif femur fracture Left 04/11/2013    Procedure: OPEN REDUCTION INTERNAL FIXATION (ORIF) DISTAL FEMUR FRACTURE- left;  Surgeon: Senaida Lange, MD;  Location: [facility] OR;  Service: Orthopedics;  Laterality: Left;    History  Smoking status  . Never Smoker   Smokeless tobacco  . Never Used    History  Alcohol Use  . Yes    No family history on file.  Review of Systems: The review of systems is per the HPI.  All other systems were reviewed and are negative.  Physical Exam:  BP 110/60, HR 62 BPM Patient is very pleasant and in no acute distress. Skin is warm and dry. Color is normal.  HEENT is unremarkable but with missing teeth. Normocephalic/atraumatic. PERRL. Sclera are nonicteric. Neck is supple. No masses. No JVD. Lungs are  clear. Cardiac exam shows a regular rate and rhythm. Little fast on my exam. Few ectopics. Abdomen is soft. Extremities with B/L feet + ankle pitting edema. Loose toenail on the right foot, no erythema. Gait not tested = she is in a wheelchair.  No gross neurologic deficits noted.  Wt Readings from Last 3 Encounters:  09/15/13 137 lb (62.143 kg)  09/02/13 137 lb (62.143 kg)  08/29/13 138 lb 12.8 oz (62.959 kg)    LABORATORY DATA: EKG today shows sinus rhythm - rate of 96 - with PACs - anteroseptal infarct.   Lab Results  Component Value Date   CALCIUM 8.7 09/02/2013   CAION 1.09* 06/28/2010   PHOS 5.1* 08/27/2013     Chemistry      Component Value Date/Time   NA 136  09/02/2013 0911   K 4.6 09/02/2013 0911   CL 94* 09/02/2013 0911   CO2 34* 09/02/2013 0911   BUN 46* 09/02/2013 0911   CREATININE 2.0* 09/02/2013 0911      Component Value Date/Time   CALCIUM 8.7 09/02/2013 0911   ALKPHOS 53 08/27/2013 1520   AST 20 08/27/2013 1520   ALT <5 08/27/2013 1520   BILITOT 0.4 08/27/2013 1520     Lab Results  Component Value Date   WBC 9.4 08/27/2013   HGB 10.6* 08/27/2013   HCT 33.0* 08/27/2013   MCV 89.9 08/27/2013   PLT 158 08/27/2013    Echo Study Conclusions  - Left ventricle: The cavity size was normal. Systolic function was severely reduced. The estimated ejection fraction was in the range of 15% to 20%. - Mitral valve: Mild regurgitation. - Left atrium: The atrium was severely dilated. - Right ventricle: The cavity size was mildly dilated. Wall thickness was normal. Systolic function was reduced. - Right atrium: The atrium was moderately to severely dilated.   Assessment / Plan:  1. Combined systolic and diastolic HF - with EF now down to 15% - family is aware - they are really wanting just a conservative/palliative care approach - I would be in agreement with that plan. She is currently compensated and has tuned up nicely with diuretics and thoracentesis. We will continue Lasix 40 mg PO daily. Crea was elevated at the last visit (from 1.8 to 2.0). We will check again today. The patient feels great and unless kidney function is worsened we will continue the same regimen.   2. TB lung - h/o   3. Recurrent pleural effusion - has had total of 3 thoracentesis since June - family really not interested in pleurex cath - will manage conservatively.   4. Loose right toenail - no evidence of cellulitis, advised to use antibiotic ointment to prevent infection once it comes out  5. Advanced age - has actually done pretty well to be 89   Follow up in 3 months with BMP   Tobias Alexander, H 10/14/2013

## 2013-11-18 ENCOUNTER — Other Ambulatory Visit: Payer: Self-pay | Admitting: Family Medicine

## 2013-11-18 ENCOUNTER — Ambulatory Visit
Admission: RE | Admit: 2013-11-18 | Discharge: 2013-11-18 | Disposition: A | Discharge status: Home / Self Care | Payer: Medicare Other | Source: Ambulatory Visit | Attending: Family Medicine | Admitting: Family Medicine

## 2013-11-18 DIAGNOSIS — I96 Gangrene, not elsewhere classified: Secondary | ICD-10-CM

## 2013-11-23 ENCOUNTER — Other Ambulatory Visit: Payer: Self-pay | Admitting: *Deleted

## 2013-11-23 ENCOUNTER — Encounter: Payer: Self-pay | Admitting: Vascular Surgery

## 2013-11-23 DIAGNOSIS — I70269 Atherosclerosis of native arteries of extremities with gangrene, unspecified extremity: Secondary | ICD-10-CM

## 2013-11-24 ENCOUNTER — Ambulatory Visit (INDEPENDENT_AMBULATORY_CARE_PROVIDER_SITE_OTHER): Payer: Medicare Other | Admitting: Vascular Surgery

## 2013-11-24 ENCOUNTER — Encounter: Payer: Self-pay | Admitting: Vascular Surgery

## 2013-11-24 ENCOUNTER — Ambulatory Visit (HOSPITAL_COMMUNITY)
Admission: RE | Admit: 2013-11-24 | Discharge: 2013-11-24 | Disposition: A | Discharge status: Home / Self Care | Payer: Medicare Other | Source: Ambulatory Visit | Attending: Vascular Surgery | Admitting: Vascular Surgery

## 2013-11-24 VITALS — BP 142/74 | HR 112 | Ht 67.0 in | Wt 135.0 lb

## 2013-11-24 DIAGNOSIS — I70269 Atherosclerosis of native arteries of extremities with gangrene, unspecified extremity: Secondary | ICD-10-CM | POA: Insufficient documentation

## 2013-11-24 DIAGNOSIS — E1159 Type 2 diabetes mellitus with other circulatory complications: Secondary | ICD-10-CM | POA: Insufficient documentation

## 2013-11-24 NOTE — Progress Notes (Signed)
[facility] HISTORY AND PHYSICAL   History of Present Illness:  Patient is a 80 y.o. year old female who presents for evaluation of left great toe pain and possible gangrene. She has had pain in her left foot for "a while" but recalls her toe hurting in the past two weeks. It hurts "if you mash on it." She is able to ambulate with minimal pain. Though most of her ambulation consists of walking only to the bathroom or to the kitchen. She lives at home with her son and daughter in law. Her daughter in law noted previous odor and drainage from left great toe. She has been previously seen by her PCP Dr. Manus GunningEhinger who prescribed her Keflex. She still has one week left of her medication. She denies any fever or numbness in her extremities.   Other medical problems include has HTN (hypertension); Femur fracture, left; Chronic renal insufficiency, stage III (moderate); Diabetes type 2, controlled; Acute respiratory failure with hypoxia; Acute systolic heart failure; Pleural effusion, left; and Atherosclerosis of native arteries of the extremities with gangrene on her problem list.  Past Medical History  Diagnosis Date  . Diabetes mellitus without complication   . HTN (hypertension)   . CKD (chronic kidney disease)   . Systolic CHF   . CHF (congestive heart failure)   . COPD (chronic obstructive pulmonary disease)   . Peripheral vascular disease     Past Surgical History  Procedure Laterality Date  . Femur fracture surgery Left 03/2013    L distal femur lateral condyle s/p ORIF  . Orif femur fracture Left 04/11/2013    Procedure: OPEN REDUCTION INTERNAL FIXATION (ORIF) DISTAL FEMUR FRACTURE- left;  Surgeon: Senaida LangeKevin M Supple, MD;  Location: [facility] OR;  Service: Orthopedics;  Laterality: Left;    Social History History  Substance Use Topics  . Smoking status: Never Smoker   . Smokeless tobacco: Never Used  . Alcohol Use: No    Family History Family History  Problem Relation  Age of Onset  . Parkinson's disease Maternal Grandfather   . Heart attack Paternal Grandmother   . Heart attack Paternal Grandfather     Allergies  Allergies  Allergen Reactions  . Actos [Pioglitazone] Other (See Comments)    May have dropped blood sugar too much-family unsure     Current Outpatient Prescriptions  Medication Sig Dispense Refill  . acetaminophen (TYLENOL) 500 MG tablet Take 500 mg by mouth every 6 (six) hours as needed for pain (for pain in leg).      Marland Kitchen. aspirin EC 81 MG tablet Take 81 mg by mouth daily.      . Cephalexin (KEFLEX PO) Take by mouth 3 (three) times daily.      Marland Kitchen. dextromethorphan (DELSYM) 30 MG/5ML liquid Take 30 mg by mouth as needed for cough.      . furosemide (LASIX) 40 MG tablet Take 1 tablet (40 mg total) by mouth daily.  60 tablet  3   No current facility-administered medications for this visit.    ROS:   General:  No weight loss, Fever, chills  HEENT: No recent headaches, no nasal bleeding, no visual changes, no sore throat  Neurologic: No dizziness, blackouts, seizures. No recent symptoms of stroke or mini- stroke. No recent episodes of slurred speech, or temporary blindness.  Cardiac: No recent episodes of chest pain/pressure, no shortness of breath at rest.  No shortness of breath with exertion.  Denies history of atrial fibrillation or irregular heartbeat  Vascular: No history of rest pain in feet.  No history of claudication.  + history of non-healing ulcer, No history of DVT   Pulmonary: No home oxygen, no productive cough, no hemoptysis,  No asthma or wheezing  Musculoskeletal:  [ ]  Arthritis, [ ]  Low back pain,  [x ] Joint pain  Hematologic:No history of hypercoagulable state.  No history of easy bleeding.  No history of anemia  Gastrointestinal: No hematochezia or melena,  No gastroesophageal reflux, no trouble swallowing  Urinary: [x]  chronic Kidney disease, [ ]  on HD - [ ]  MWF or [ ]  TTHS, [ ]  Burning with urination, [ ]   Frequent urination, [ ]  Difficulty urinating;   Skin: No rashes  Psychological: No history of anxiety,  No history of depression   Physical Examination  Filed Vitals:   11/24/13 1610  BP: 142/74  Pulse: 112  Height: 5\' 7"  (1.702 m)  Weight: 135 lb (61.236 kg)  SpO2: 92%    Body mass index is 21.14 kg/(m^2).  General:  Alert and oriented, no acute distress Skin: dry gangrene at left distal great toe. Cool to touch.  Extremity Pulses:  DP/PT pulses non-palpable bilaterally Musculoskeletal: 2+ pitting edema feet bilaterally  Neurologic: Upper and lower extremity motor 5/5 and symmetric  Bilateral ABIs performed today. I reviewed and interpreted this study. Left ABI 0.54, Right ABI 0.52  ASSESSMENT:  -Left great toe gangrene -Bilateral arterial insufficiency   PLAN:   -Discussed arteriogram to assess left leg, previous arteriogram right leg 06/2010.  -Discussed with family the possibility of intervention during arteriogram, but noted the low likelihood that it may be beneficial for her symptoms. -Discussed possible acute kidney injury with arteriogram -Toe amputation is an option but there is a risk of inadequate healing following procedure due to poor extremity circulation -Continue Keflex. Further antibiotic treatment per Dr. Manus Gunning. -Family to decide on future proceedings. Have not made any decisions at this point.    Maris Berger, PA-S [facility] Office: [phone] Pager: [phone]  History and exam details as listed above. Patient has dry gangrenous changes of the left first toe. These are overall fairly chronic. She is not septic. She did not have chronic pain from the toe. Previous arteriogram in 2011 showed severe tibial disease in the right lower extremity. Most likely she has a similar picture in the left lower extremity. She overall is fairly debilitated and I do not believe she would be a candidate for a bypass procedure.  However, I have offered the patient lower extremity arteriogram possible angioplasty and stenting to improve perfusion so that she could potentially have enough perfusion to heal a toe amputation. She does have a history of chronic renal insufficiency. She certainly could be at risk for contrast nephropathy. I discussed all of these possibilities with family today. They wish to think about whether or not to proceed with arteriography or continue observation alone. As long as the toe remains dry gangrenous changes and she does not have significant pain or a significant infection this is probably a reasonable course. However I did discuss with him that this has progressive changes she would potentially need a amputation of her leg. They understand all these possibilities. There primarily interested in an adequate quality of life. They will call if they wish to pursue arteriogram.  Fabienne Bruns, MD [facility] Office: [phone] Pager: [phone]

## 2014-01-16 ENCOUNTER — Encounter: Payer: Self-pay | Admitting: *Deleted

## 2014-01-17 ENCOUNTER — Ambulatory Visit: Payer: Medicare Other | Admitting: Cardiology

## 2014-02-03 ENCOUNTER — Encounter: Payer: Self-pay | Admitting: Cardiology

## 2014-02-12 ENCOUNTER — Encounter (HOSPITAL_COMMUNITY): Payer: Self-pay | Admitting: Emergency Medicine

## 2014-02-12 ENCOUNTER — Inpatient Hospital Stay (HOSPITAL_COMMUNITY)
Admission: EM | Admit: 2014-02-12 | Discharge: 2014-02-15 | DRG: 292 | Disposition: A | Discharge status: Skilled Nursing Facility | Payer: Medicare Other | Attending: Internal Medicine | Admitting: Internal Medicine

## 2014-02-12 ENCOUNTER — Emergency Department (HOSPITAL_COMMUNITY): Payer: Medicare Other

## 2014-02-12 DIAGNOSIS — I96 Gangrene, not elsewhere classified: Secondary | ICD-10-CM

## 2014-02-12 DIAGNOSIS — N183 Chronic kidney disease, stage 3 unspecified: Secondary | ICD-10-CM

## 2014-02-12 DIAGNOSIS — I509 Heart failure, unspecified: Secondary | ICD-10-CM

## 2014-02-12 DIAGNOSIS — I5021 Acute systolic (congestive) heart failure: Secondary | ICD-10-CM

## 2014-02-12 DIAGNOSIS — J9601 Acute respiratory failure with hypoxia: Secondary | ICD-10-CM

## 2014-02-12 DIAGNOSIS — I70269 Atherosclerosis of native arteries of extremities with gangrene, unspecified extremity: Secondary | ICD-10-CM

## 2014-02-12 DIAGNOSIS — J449 Chronic obstructive pulmonary disease, unspecified: Secondary | ICD-10-CM | POA: Diagnosis present

## 2014-02-12 DIAGNOSIS — I10 Essential (primary) hypertension: Secondary | ICD-10-CM | POA: Diagnosis present

## 2014-02-12 DIAGNOSIS — J90 Pleural effusion, not elsewhere classified: Secondary | ICD-10-CM

## 2014-02-12 DIAGNOSIS — I5023 Acute on chronic systolic (congestive) heart failure: Principal | ICD-10-CM | POA: Diagnosis present

## 2014-02-12 DIAGNOSIS — J4489 Other specified chronic obstructive pulmonary disease: Secondary | ICD-10-CM | POA: Diagnosis present

## 2014-02-12 DIAGNOSIS — D638 Anemia in other chronic diseases classified elsewhere: Secondary | ICD-10-CM | POA: Diagnosis present

## 2014-02-12 DIAGNOSIS — N184 Chronic kidney disease, stage 4 (severe): Secondary | ICD-10-CM | POA: Diagnosis present

## 2014-02-12 DIAGNOSIS — S7292XA Unspecified fracture of left femur, initial encounter for closed fracture: Secondary | ICD-10-CM

## 2014-02-12 DIAGNOSIS — I5022 Chronic systolic (congestive) heart failure: Secondary | ICD-10-CM | POA: Diagnosis present

## 2014-02-12 DIAGNOSIS — Z7982 Long term (current) use of aspirin: Secondary | ICD-10-CM

## 2014-02-12 DIAGNOSIS — I129 Hypertensive chronic kidney disease with stage 1 through stage 4 chronic kidney disease, or unspecified chronic kidney disease: Secondary | ICD-10-CM | POA: Diagnosis present

## 2014-02-12 DIAGNOSIS — E119 Type 2 diabetes mellitus without complications: Secondary | ICD-10-CM

## 2014-02-12 DIAGNOSIS — Z79899 Other long term (current) drug therapy: Secondary | ICD-10-CM

## 2014-02-12 LAB — CBC WITH DIFFERENTIAL/PLATELET
Basophils Absolute: 0 10*3/uL (ref 0.0–0.1)
Basophils Relative: 1 % (ref 0–1)
EOS ABS: 0.1 10*3/uL (ref 0.0–0.7)
Eosinophils Relative: 2 % (ref 0–5)
HCT: 34.7 % — ABNORMAL LOW (ref 36.0–46.0)
Hemoglobin: 10.8 g/dL — ABNORMAL LOW (ref 12.0–15.0)
LYMPHS ABS: 0.8 10*3/uL (ref 0.7–4.0)
Lymphocytes Relative: 9 % — ABNORMAL LOW (ref 12–46)
MCH: 28.1 pg (ref 26.0–34.0)
MCHC: 31.1 g/dL (ref 30.0–36.0)
MCV: 90.4 fL (ref 78.0–100.0)
Monocytes Absolute: 0.7 10*3/uL (ref 0.1–1.0)
Monocytes Relative: 8 % (ref 3–12)
NEUTROS ABS: 7 10*3/uL (ref 1.7–7.7)
Neutrophils Relative %: 80 % — ABNORMAL HIGH (ref 43–77)
PLATELETS: 257 10*3/uL (ref 150–400)
RBC: 3.84 MIL/uL — ABNORMAL LOW (ref 3.87–5.11)
RDW: 15.3 % (ref 11.5–15.5)
WBC: 8.6 10*3/uL (ref 4.0–10.5)

## 2014-02-12 LAB — PRO B NATRIURETIC PEPTIDE: PRO B NATRI PEPTIDE: 38493 pg/mL — AB (ref 0–450)

## 2014-02-12 LAB — COMPREHENSIVE METABOLIC PANEL
ALK PHOS: 66 U/L (ref 39–117)
AST: 15 U/L (ref 0–37)
Albumin: 3 g/dL — ABNORMAL LOW (ref 3.5–5.2)
BUN: 51 mg/dL — ABNORMAL HIGH (ref 6–23)
CALCIUM: 9.3 mg/dL (ref 8.4–10.5)
CO2: 26 mEq/L (ref 19–32)
Chloride: 100 mEq/L (ref 96–112)
Creatinine, Ser: 1.75 mg/dL — ABNORMAL HIGH (ref 0.50–1.10)
GFR calc Af Amer: 27 mL/min — ABNORMAL LOW (ref >90)
GFR calc non Af Amer: 24 mL/min — ABNORMAL LOW (ref >90)
Glucose, Bld: 186 mg/dL — ABNORMAL HIGH (ref 70–99)
POTASSIUM: 4.8 meq/L (ref 3.7–5.3)
Sodium: 140 mEq/L (ref 137–147)
TOTAL PROTEIN: 8.1 g/dL (ref 6.0–8.3)
Total Bilirubin: 0.4 mg/dL (ref 0.3–1.2)

## 2014-02-12 LAB — GLUCOSE, CAPILLARY: Glucose-Capillary: 123 mg/dL — ABNORMAL HIGH (ref 70–99)

## 2014-02-12 LAB — TROPONIN I: Troponin I: <0.3 ng/mL (ref <0.30)

## 2014-02-12 MED ORDER — LEVOFLOXACIN IN D5W 500 MG/100ML IV SOLN
500.0000 mg | INTRAVENOUS | Status: DC
  Administered 2014-02-12: 500 mg via INTRAVENOUS
  Filled 2014-02-12 (×2): qty 100

## 2014-02-12 MED ORDER — ASPIRIN EC 81 MG PO TBEC
81.0000 mg | DELAYED_RELEASE_TABLET | Freq: Every day | ORAL | Status: DC
  Administered 2014-02-12 – 2014-02-15 (×4): 81 mg via ORAL
  Filled 2014-02-12 (×4): qty 1

## 2014-02-12 MED ORDER — ENOXAPARIN SODIUM 40 MG/0.4ML ~~LOC~~ SOLN
40.0000 mg | SUBCUTANEOUS | Status: DC
  Administered 2014-02-12: 40 mg via SUBCUTANEOUS
  Filled 2014-02-12 (×2): qty 0.4

## 2014-02-12 MED ORDER — SODIUM CHLORIDE 0.9 % IV SOLN: 250.0000 mL | INTRAVENOUS | Status: DC | PRN

## 2014-02-12 MED ORDER — DEXTROMETHORPHAN POLISTIREX 30 MG/5ML PO LQCR
30.0000 mg | Freq: Two times a day (BID) | ORAL | Status: DC | PRN
  Filled 2014-02-12: qty 5

## 2014-02-12 MED ORDER — FUROSEMIDE 10 MG/ML IJ SOLN
40.0000 mg | Freq: Two times a day (BID) | INTRAMUSCULAR | Status: DC
  Administered 2014-02-12 – 2014-02-14 (×4): 40 mg via INTRAVENOUS
  Filled 2014-02-12 (×6): qty 4

## 2014-02-12 MED ORDER — INSULIN ASPART 100 UNIT/ML ~~LOC~~ SOLN
0.0000 [IU] | Freq: Three times a day (TID) | SUBCUTANEOUS | Status: DC
  Administered 2014-02-13 – 2014-02-15 (×3): 2 [IU] via SUBCUTANEOUS

## 2014-02-12 MED ORDER — FUROSEMIDE 10 MG/ML IJ SOLN
60.0000 mg | Freq: Once | INTRAMUSCULAR | Status: AC
  Administered 2014-02-12: 60 mg via INTRAVENOUS
  Filled 2014-02-12: qty 6

## 2014-02-12 MED ORDER — SODIUM CHLORIDE 0.9 % IJ SOLN
3.0000 mL | Freq: Two times a day (BID) | INTRAMUSCULAR | Status: DC
  Administered 2014-02-12 – 2014-02-15 (×7): 3 mL via INTRAVENOUS

## 2014-02-12 MED ORDER — SODIUM CHLORIDE 0.9 % IJ SOLN: 3.0000 mL | INTRAMUSCULAR | Status: DC | PRN

## 2014-02-12 NOTE — ED Notes (Signed)
Transporting patient to new room assignment. 

## 2014-02-12 NOTE — H&P (Signed)
[facility] History and Physical  Alyssa Knight:[id number] DOB: 03/15/1917 DOA: 02/12/2014  Referring physician: EDP PCP: Thora Lance, MD   Chief Complaint: GENERALIZED WEAKNESS, worsening of the pedal edema.   HPI: Alyssa Knight is a 80 y.o. female with prior h/o chronic systolic heart failure, diet controlled dm, chronic gangrene of the toe, hypertension and stage 3 ckd, PVD,  brought in by family for generalized weakness,and worsening leg edema over the last few weeks. On arrival to ED, a probnp was ordered, came back elevated at 38,000's, . Her CXR showed slight worsening of her left pleural effusion. She denies fever or chills. She does not have leukocytosis. Her leg edema have worsened and her left foot looks more swollen than right foot. She is then referred to medical service for admission for acute CHF.    Review of Systems:  See hpi otherwise negative.  Past Medical History  Diagnosis Date  . Diabetes mellitus without complication   . HTN (hypertension)   . CKD (chronic kidney disease)   . Systolic CHF   . CHF (congestive heart failure)   . COPD (chronic obstructive pulmonary disease)   . Peripheral vascular disease    Past Surgical History  Procedure Laterality Date  . Femur fracture surgery Left 03/2013    L distal femur lateral condyle s/p ORIF  . Orif femur fracture Left 04/11/2013    Procedure: OPEN REDUCTION INTERNAL FIXATION (ORIF) DISTAL FEMUR FRACTURE- left;  Surgeon: Senaida Lange, MD;  Location: [facility] OR;  Service: Orthopedics;  Laterality: Left;   Social History:  reports that she has never smoked. She has never used smokeless tobacco. She reports that she does not drink alcohol or use illicit drugs.  Allergies  Allergen Reactions  . Fish Allergy Shortness Of Breath  . Actos [Pioglitazone] Other (See Comments)    May have dropped blood sugar too much-family unsure    Family History  Problem Relation Age of Onset  . Parkinson's disease  Maternal Grandfather   . Heart attack Paternal Grandmother   . Heart attack Paternal Grandfather      Prior to Admission medications   Medication Sig Start Date End Date Taking? Authorizing Provider  acetaminophen (TYLENOL) 500 MG tablet Take 500 mg by mouth every 6 (six) hours as needed for pain (for pain in leg).   Yes Historical Provider, MD  aspirin EC 81 MG tablet Take 81 mg by mouth daily.   Yes Historical Provider, MD  dextromethorphan (DELSYM) 30 MG/5ML liquid Take 30 mg by mouth as needed for cough.   Yes Historical Provider, MD  furosemide (LASIX) 40 MG tablet Take 1 tablet (40 mg total) by mouth daily. 09/02/13  Yes Rosalio Macadamia, NP  Multiple Vitamin (MULTIVITAMIN WITH MINERALS) TABS tablet Take 1 tablet by mouth daily.   Yes Historical Provider, MD  neomycin-bacitracin-polymyxin (NEOSPORIN) ointment Apply 1 application topically daily. Apply to feet   Yes Historical Provider, MD   Physical Exam: Filed Vitals:   02/12/14 1542  BP: 141/81  Pulse: 117  Temp: 97.3 F (36.3 C)  Resp: 22    BP 141/81  Pulse 117  Temp(Src) 97.3 F (36.3 C) (Oral)  Resp 22  Ht 5\' 7"  (1.702 m)  Wt 57.9 kg (127 lb 10.3 oz)  BMI 19.99 kg/m2  SpO2 98%  General:  Appears calm and comfortable Eyes: PERRL, normal lids, irises & conjunctiva Neck: no LAD, masses or thyromegaly Cardiovascular: RRR, no m/r/g. No LE edema. Respiratory: decreased air  entry at left base, Normal respiratory effort. Abdomen: soft, ntnd Skin: no rash or induration seen on limited exam Musculoskeletal: bilateral lower extremity edema 3+ left> right with gangrene of the right toe.  Psychiatric: grossly normal mood and affect, speech fluent and appropriate Neurologic: grossly non-focal.          Labs on Admission:  Basic Metabolic Panel:  Recent Labs Lab 02/12/14 1307  NA 140  K 4.8  CL 100  CO2 26  GLUCOSE 186*  BUN 51*  CREATININE 1.75*  CALCIUM 9.3   Liver Function Tests:  Recent Labs Lab  02/12/14 1307  AST 15  ALT <5  ALKPHOS 66  BILITOT 0.4  PROT 8.1  ALBUMIN 3.0*   No results found for this basename: LIPASE, AMYLASE,  in the last 168 hours No results found for this basename: AMMONIA,  in the last 168 hours CBC:  Recent Labs Lab 02/12/14 1307  WBC 8.6  NEUTROABS 7.0  HGB 10.8*  HCT 34.7*  MCV 90.4  PLT 257   Cardiac Enzymes:  Recent Labs Lab 02/12/14 1307  TROPONINI <0.30    BNP (last 3 results)  Recent Labs  09/02/13 0911 10/14/13 1436 02/12/14 1307  PROBNP 1779.0* 1535.0* 38493.0*   CBG: No results found for this basename: GLUCAP,  in the last 168 hours  Radiological Exams on Admission: Dg Chest 2 View  02/12/2014   CLINICAL DATA:  Generalized weakness and fatigue.  EXAM: CHEST  2 VIEW  COMPARISON:  08/27/2013.  FINDINGS: Enlarged cardiomediastinal silhouette. Multiple calcifications are re- demonstrated throughout the right chest. Moderate size left pleural effusion. Airspace disease with left lower lobe consolidation not excluded. No pneumothorax. Reduced volume right hemithorax. Osteopenia. Considerable worsening aeration.  IMPRESSION: Marked worsening left base process with pleural effusion. Consolidative LLL airspace disease not excluded.   Electronically Signed   By: Davonna BellingJohn  Curnes M.D.   On: 02/12/2014 14:12    EKG: sinus tachy  Assessment/Plan Active Problems:   HTN (hypertension)   Chronic renal insufficiency, stage III (moderate)   Diabetes type 2, controlled   Acute systolic heart failure   CHF (congestive heart failure)   Gangrene of toe   Acute systolic heart failure: - admit to telemetry.  -s tarted her on IV lasix, and monitor renal function on IV lasix.  - no ace/arb secondary to renal insufficiency.  - please call cardiology in am.  - intake and output and daily weights.   2. Diet controlled DM: - SSI.   3. Gangrene of the toe and PVD: - wound care consult , and conservative  Management as she does not want any  aggressive surgery or treatment.   4. Leg edema; probably from #1, Venous dopplers.  5. Anemia: Mild, chronic, stable.   6. DVT prophylaxis.   Code Status: LIMITED  Family Communication: discussed with son and family at bedside, they wanted social worker consult for disposition. Please talkt ot he family prior to talking to the patient.  Disposition Plan: pending PT EVAL.   Time spent: 4170 M IN  Kathlen ModyVijaya Nimo Verastegui [facility] Pager [phone]

## 2014-02-12 NOTE — ED Notes (Signed)
Pt presents to department for evaluation of generalized weakness, decreased appetite, swelling to bilateral lower legs, and open wounds with foul odor to bilateral feet. Pt denies pain at the time. Respirations unlabored. Pt is alert and oriented x4.

## 2014-02-12 NOTE — ED Provider Notes (Signed)
CSN: [id number]     Arrival date & time 02/12/14  1234 History   First MD Initiated Contact with Patient 02/12/14 1239     Chief Complaint  Patient presents with  . Fatigue  . Leg Swelling     (Consider location/radiation/quality/duration/timing/severity/associated sxs/prior Treatment) Patient is a 80 y.o. female presenting with weakness. The history is provided by the patient.  Weakness This is a new problem. The current episode started more than 2 days ago. The problem occurs constantly. The problem has been gradually worsening. Associated symptoms include shortness of breath (with lying down). Pertinent negatives include no chest pain and no abdominal pain. Nothing aggravates the symptoms. Nothing relieves the symptoms.    Past Medical History  Diagnosis Date  . Diabetes mellitus without complication   . HTN (hypertension)   . CKD (chronic kidney disease)   . Systolic CHF   . CHF (congestive heart failure)   . COPD (chronic obstructive pulmonary disease)   . Peripheral vascular disease    Past Surgical History  Procedure Laterality Date  . Femur fracture surgery Left 03/2013    L distal femur lateral condyle s/p ORIF  . Orif femur fracture Left 04/11/2013    Procedure: OPEN REDUCTION INTERNAL FIXATION (ORIF) DISTAL FEMUR FRACTURE- left;  Surgeon: Senaida LangeKevin M Supple, MD;  Location: [facility] OR;  Service: Orthopedics;  Laterality: Left;   Family History  Problem Relation Age of Onset  . Parkinson's disease Maternal Grandfather   . Heart attack Paternal Grandmother   . Heart attack Paternal Grandfather    History  Substance Use Topics  . Smoking status: Never Smoker   . Smokeless tobacco: Never Used  . Alcohol Use: No   OB History   Grav Para Term Preterm Abortions TAB SAB Ect Mult Living                 Review of Systems  Constitutional: Negative for fever and chills.  Respiratory: Positive for shortness of breath (with lying down). Negative for cough.   Cardiovascular:  Positive for leg swelling (L leg). Negative for chest pain.  Gastrointestinal: Negative for vomiting and abdominal pain.  Neurological: Positive for weakness.  All other systems reviewed and are negative.     Allergies  Actos  Home Medications   Prior to Admission medications   Medication Sig Start Date End Date Taking? Authorizing Provider  acetaminophen (TYLENOL) 500 MG tablet Take 500 mg by mouth every 6 (six) hours as needed for pain (for pain in leg).    Historical Provider, MD  aspirin EC 81 MG tablet Take 81 mg by mouth daily.    Historical Provider, MD  Cephalexin (KEFLEX PO) Take by mouth 3 (three) times daily.    Historical Provider, MD  dextromethorphan (DELSYM) 30 MG/5ML liquid Take 30 mg by mouth as needed for cough.    Historical Provider, MD  furosemide (LASIX) 40 MG tablet Take 1 tablet (40 mg total) by mouth daily. 09/02/13   Rosalio MacadamiaLori C Gerhardt, NP   BP 133/64  Pulse 95  Temp(Src) 97.4 F (36.3 C) (Oral)  Resp 33  SpO2 99% Physical Exam  Nursing note and vitals reviewed. Constitutional: She is oriented to person, place, and time. She appears well-developed and well-nourished. No distress.  HENT:  Head: Normocephalic and atraumatic.  Eyes: EOM are normal. Pupils are equal, round, and reactive to light.  Neck: Normal range of motion. Neck supple.  Cardiovascular: Normal rate and regular rhythm.  Exam reveals no friction rub.  No murmur heard. Pulmonary/Chest: Effort normal and breath sounds normal. No respiratory distress. She has no wheezes. She has no rales.  Abdominal: Soft. She exhibits no distension. There is no tenderness. There is no rebound.  Musculoskeletal: Normal range of motion. She exhibits no edema.  Neurological: She is alert and oriented to person, place, and time. No cranial nerve deficit. She exhibits normal muscle tone. Coordination normal.  Skin: No rash noted. She is not diaphoretic.    ED Course  Procedures (including critical care  time) Labs Review Labs Reviewed  CBC WITH DIFFERENTIAL - Abnormal; Notable for the following:    RBC 3.84 (*)    Hemoglobin 10.8 (*)    HCT 34.7 (*)    Neutrophils Relative % 80 (*)    Lymphocytes Relative 9 (*)    All other components within normal limits  PRO B NATRIURETIC PEPTIDE - Abnormal; Notable for the following:    Pro B Natriuretic peptide (BNP) 38493.0 (*)    All other components within normal limits  COMPREHENSIVE METABOLIC PANEL - Abnormal; Notable for the following:    Glucose, Bld 186 (*)    BUN 51 (*)    Creatinine, Ser 1.75 (*)    Albumin 3.0 (*)    GFR calc non Af Amer 24 (*)    GFR calc Af Amer 27 (*)    All other components within normal limits  TROPONIN I  URINALYSIS, ROUTINE W REFLEX MICROSCOPIC    Imaging Review Dg Chest 2 View  02/12/2014   CLINICAL DATA:  Generalized weakness and fatigue.  EXAM: CHEST  2 VIEW  COMPARISON:  08/27/2013.  FINDINGS: Enlarged cardiomediastinal silhouette. Multiple calcifications are re- demonstrated throughout the right chest. Moderate size left pleural effusion. Airspace disease with left lower lobe consolidation not excluded. No pneumothorax. Reduced volume right hemithorax. Osteopenia. Considerable worsening aeration.  IMPRESSION: Marked worsening left base process with pleural effusion. Consolidative LLL airspace disease not excluded.   Electronically Signed   By: Davonna Belling M.D.   On: 02/12/2014 14:12     EKG Interpretation   Date/Time:  Sunday February 12 2014 12:55:52 EDT Ventricular Rate:  109 PR Interval:  163 QRS Duration: 79 QT Interval:  352 QTC Calculation: 474 R Axis:   -38 Text Interpretation:  Sinus tachycardia Ventricular premature complex  Probable left atrial enlargement Inferior infarct, old Anterior infarct,  old Lateral leads are also involved Similar to prior Confirmed by [facility]   MD, Alia Parsley ([id number]) on 02/12/2014 2:32:34 PM      MDM   Final diagnoses:  CHF exacerbation    73F presents with  weakness, SOB, L leg swelling. Gradually worsening over past week. No fevers, no cough, no CP. Son states he saw her with breathing difficulty with lying down. Chronic PVD with prior workup - toes with some purulence, duskiness - chronic. Here with decreased air movement in R lung, no rales/rhonchi. Belly benign. EKG reassuring. Labs show CHF exacerbation. Lasix given. Admitted to medicine. CXR says possible PNA, however without a white count, no cough, no fever, no hypoxia, will hold off on antibiotics at this time.    Dagmar Hait, MD 02/12/14 [phone]

## 2014-02-12 NOTE — ED Notes (Signed)
Went into Patient to see if I could do and In and Out cath.  Patient was out of room.

## 2014-02-13 DIAGNOSIS — I5021 Acute systolic (congestive) heart failure: Secondary | ICD-10-CM

## 2014-02-13 DIAGNOSIS — J9600 Acute respiratory failure, unspecified whether with hypoxia or hypercapnia: Secondary | ICD-10-CM

## 2014-02-13 DIAGNOSIS — I10 Essential (primary) hypertension: Secondary | ICD-10-CM

## 2014-02-13 DIAGNOSIS — M7989 Other specified soft tissue disorders: Secondary | ICD-10-CM

## 2014-02-13 LAB — BASIC METABOLIC PANEL
BUN: 50 mg/dL — ABNORMAL HIGH (ref 6–23)
CHLORIDE: 101 meq/L (ref 96–112)
CO2: 26 mEq/L (ref 19–32)
CREATININE: 1.71 mg/dL — AB (ref 0.50–1.10)
Calcium: 8.8 mg/dL (ref 8.4–10.5)
GFR calc non Af Amer: 24 mL/min — ABNORMAL LOW (ref >90)
GFR, EST AFRICAN AMERICAN: 28 mL/min — AB (ref >90)
Glucose, Bld: 89 mg/dL (ref 70–99)
POTASSIUM: 4.4 meq/L (ref 3.7–5.3)
SODIUM: 141 meq/L (ref 137–147)

## 2014-02-13 LAB — GLUCOSE, CAPILLARY
Glucose-Capillary: 87 mg/dL (ref 70–99)
Glucose-Capillary: 153 mg/dL — ABNORMAL HIGH (ref 70–99)
Glucose-Capillary: 85 mg/dL (ref 70–99)
Glucose-Capillary: 137 mg/dL — ABNORMAL HIGH (ref 70–99)

## 2014-02-13 LAB — HEMOGLOBIN A1C
HEMOGLOBIN A1C: 7 % — AB (ref <5.7)
MEAN PLASMA GLUCOSE: 154 mg/dL — AB (ref <117)

## 2014-02-13 MED ORDER — HEPARIN SODIUM (PORCINE) 5000 UNIT/ML IJ SOLN
5000.0000 [IU] | Freq: Three times a day (TID) | INTRAMUSCULAR | Status: DC
Start: 2014-02-13 — End: 2014-02-15
  Administered 2014-02-13 – 2014-02-15 (×6): 5000 [IU] via SUBCUTANEOUS
  Filled 2014-02-13 (×7): qty 1

## 2014-02-13 MED ORDER — GLUCERNA SHAKE PO LIQD
237.0000 mL | ORAL | Status: DC
  Administered 2014-02-13: 237 mL via ORAL

## 2014-02-13 MED ORDER — MUPIROCIN CALCIUM 2 % EX CREA
TOPICAL_CREAM | Freq: Every day | CUTANEOUS | Status: DC
  Administered 2014-02-13 – 2014-02-15 (×3): via TOPICAL
  Filled 2014-02-13: qty 15

## 2014-02-13 NOTE — Progress Notes (Signed)
[facility] PROGRESS NOTE  Filed Weights   02/12/14 1542 02/13/14 0539  Weight: 57.9 kg (127 lb 10.3 oz) 57.335 kg (126 lb 6.4 oz)        Intake/Output Summary (Last 24 hours) at 02/13/14 [id number] Last data filed at 02/13/14 [id number]  Gross per 24 hour  Intake    698 ml  Output   1325 ml  Net   -627 ml     Assessment/Plan: Acute systolic heart failure (EF 15%) - IV lasix, modeate UOP - stricit I and o's daily weights, monitor electrolytes replete as needed. -57.9 ->57.3 kg  Gangrene of toe - wound consult.  HTN (hypertension) - control with lasix.  Chronic renal insufficiency, stage III (moderate) - Cr at baseline 1.7.  Diabetes type 2, controlled: - good controlled.   Code Status: LIMITED  Family Communication: discussed with son and family at bedside, they wanted social worker consult for disposition. Please talkt ot he family prior to talking to the patient.  Disposition Plan: pending PT EVAL.     Consultants:  WOC  Procedures: ECHO: none  Antibiotics:  levaquin single dose 4.19.2015.  HPI/Subjective: Feels better. SOB improve.  Objective: Filed Vitals:   02/12/14 1542 02/12/14 2058 02/13/14 0132 02/13/14 0539  BP: 141/81 120/61 122/78 137/87  Pulse: 117 95 115 106  Temp: 97.3 F (36.3 C) 97.9 F (36.6 C) 97.6 F (36.4 C) 98.4 F (36.9 C)  TempSrc: Oral Oral Oral Oral  Resp: 22 20 21 22   Height: 5\' 7"  (1.702 m)     Weight: 57.9 kg (127 lb 10.3 oz)   57.335 kg (126 lb 6.4 oz)  SpO2: 98% 97% 94% 99%     Exam:  General: Alert, awake, oriented x3, in no acute distress.  HEENT: No bruits, no goiter. +JVD Heart: Regular rate and rhythm, 2+ edema B/L Lungs: Good air movement,clear Abdomen: Soft, nontender, nondistended, positive bowel sounds.    Data Reviewed: Basic Metabolic Panel:  Recent Labs Lab 02/12/14 1307 02/13/14 0345  NA 140 141  K 4.8 4.4  CL 100 101  CO2 26 26  GLUCOSE 186* 89  BUN 51* 50*  CREATININE 1.75*  1.71*  CALCIUM 9.3 8.8   Liver Function Tests:  Recent Labs Lab 02/12/14 1307  AST 15  ALT <5  ALKPHOS 66  BILITOT 0.4  PROT 8.1  ALBUMIN 3.0*   No results found for this basename: LIPASE, AMYLASE,  in the last 168 hours No results found for this basename: AMMONIA,  in the last 168 hours CBC:  Recent Labs Lab 02/12/14 1307  WBC 8.6  NEUTROABS 7.0  HGB 10.8*  HCT 34.7*  MCV 90.4  PLT 257   Cardiac Enzymes:  Recent Labs Lab 02/12/14 1307  TROPONINI <0.30   BNP (last 3 results)  Recent Labs  09/02/13 0911 10/14/13 1436 02/12/14 1307  PROBNP 1779.0* 1535.0* 38493.0*   CBG:  Recent Labs Lab 02/12/14 2147 02/13/14 0551  GLUCAP 123* 87    No results found for this or any previous visit (from the past 240 hour(s)).   Studies: Dg Chest 2 View  02/12/2014   CLINICAL DATA:  Generalized weakness and fatigue.  EXAM: CHEST  2 VIEW  COMPARISON:  08/27/2013.  FINDINGS: Enlarged cardiomediastinal silhouette. Multiple calcifications are re- demonstrated throughout the right chest. Moderate size left pleural effusion. Airspace disease with left lower lobe consolidation not excluded. No pneumothorax. Reduced volume right hemithorax. Osteopenia. Considerable worsening aeration.  IMPRESSION: Marked worsening left base process  with pleural effusion. Consolidative LLL airspace disease not excluded.   Electronically Signed   By: Davonna Belling M.D.   On: 02/12/2014 14:12    Scheduled Meds: . aspirin EC  81 mg Oral Daily  . enoxaparin (LOVENOX) injection  40 mg Subcutaneous Q24H  . furosemide  40 mg Intravenous Q12H  . insulin aspart  0-9 Units Subcutaneous TID WC  . levofloxacin (LEVAQUIN) IV  500 mg Intravenous Q24H  . sodium chloride  3 mL Intravenous Q12H   Continuous Infusions:    Marinda Elk  [facility] Pager [phone] If 8PM-8AM, please contact night-coverage at [email or web address], password [email or web address] 02/13/2014, 8:08 AM  LOS: 1 day

## 2014-02-13 NOTE — Evaluation (Signed)
Physical Therapy Evaluation Patient Details Name: Alyssa Knight MRN: [id number] DOB: 04/20/1917 Today's Date: 02/13/2014   History of Present Illness  80 y.o. female with prior h/o chronic systolic heart failure, diet controlled dm, chronic gangrene of the toe, hypertension and stage 3 ckd, PVD,  brought in by family for generalized weakness,and worsening leg edema over the last few weeks. She is then referred to medical service for admission for acute CHF.   Clinical Impression  Pt presents with decreased strength, balance and mobility and will benefit from skilled PT to increase independence and decrease burden of care for caregivers at home.    Follow Up Recommendations Home health PT;Supervision for mobility/OOB    Equipment Recommendations  None recommended by PT    Recommendations for Other Services       Precautions / Restrictions Precautions Precautions: Fall Restrictions Weight Bearing Restrictions: No      Mobility  Bed Mobility               General bed mobility comments: pt seated on EOB when PT arrived  Transfers Overall transfer level: Needs assistance Equipment used: Rolling walker (2 wheeled) Transfers: Sit to/from Stand Sit to Stand: Min assist         General transfer comment: min lifting assist, cues for UE placement  Ambulation/Gait Ambulation/Gait assistance: Min assist;Min guard Ambulation Distance (Feet): 50 Feet Assistive device: Rolling walker (2 wheeled)   Gait velocity: decreased   General Gait Details: pt initially required min A for balance with gait, progressed to min guard as she became more comfortable.  Pt with no LOB, decreased cadence, forward flexed posture  Stairs            Wheelchair Mobility    Modified Rankin (Stroke Patients Only)       Balance Overall balance assessment: Needs assistance Sitting-balance support: No upper extremity supported;Feet supported Sitting balance-Leahy Scale: Good Sitting  balance - Comments: able to sit and eat breakfast without assist   Standing balance support: Bilateral upper extremity supported;During functional activity Standing balance-Leahy Scale: Poor Standing balance comment: requires RW and min A for balance                             Pertinent Vitals/Pain No c/o pain    Home Living Family/patient expects to be discharged to:: Private residence Living Arrangements: Children Available Help at Discharge: Family;Available 24 hours/day Type of Home: House Home Access: Ramped entrance     Home Layout: One level Home Equipment: Walker - 2 wheels      Prior Function Level of Independence: Needs assistance         Comments: children assist with mobility and ADLs     Hand Dominance        Extremity/Trunk Assessment   Upper Extremity Assessment: Generalized weakness           Lower Extremity Assessment: Generalized weakness      Cervical / Trunk Assessment: Kyphotic  Communication   Communication: No difficulties  Cognition Arousal/Alertness: Awake/alert Behavior During Therapy: WFL for tasks assessed/performed Overall Cognitive Status: Within Functional Limits for tasks assessed                      General Comments      Exercises        Assessment/Plan    PT Assessment Patient needs continued PT services  PT Diagnosis Difficulty walking;Generalized weakness   PT  Problem List Decreased activity tolerance;Decreased balance;Decreased strength;Decreased mobility;Cardiopulmonary status limiting activity  PT Treatment Interventions DME instruction;Therapeutic exercise;Balance training;Gait training;Modalities;Neuromuscular re-education;Functional mobility training;Therapeutic activities;Patient/family education   PT Goals (Current goals can be found in the Care Plan section) Acute Rehab PT Goals Patient Stated Goal: go home PT Goal Formulation: With patient Time For Goal Achievement:  02/20/14 Potential to Achieve Goals: Good    Frequency Min 3X/week   Barriers to discharge        Co-evaluation               End of Session Equipment Utilized During Treatment: Gait belt Activity Tolerance: Patient tolerated treatment well Patient left: in bed;with call bell/phone within reach;with bed alarm set (pt sitting EOB eating breakfast) Nurse Communication: Mobility status         Time: [id number] PT Time Calculation (min): 13 min   Charges:   PT Evaluation $Initial PT Evaluation Tier I: 1 Procedure     PT G Codes:          Leone BrandKaren K Adem Costlow 02/13/2014, 9:53 AM

## 2014-02-13 NOTE — Progress Notes (Signed)
INITIAL NUTRITION ASSESSMENT  DOCUMENTATION CODES Per approved criteria  -Not Applicable   INTERVENTION: -Recommend Glucerna Shake Q24H, each supplement provides 220 kcal and 10 grams of protein  -Encouraged heart healthy meals and low sodium snacks -Will continue to monitor  NUTRITION DIAGNOSIS: Inadequate oral intake related to decreased appeite as evidenced by 20 lbs unintentional wt loss in one year.   Goal: Pt to meet >/= 90% of their estimated nutrition needs    Monitor:  Total protein/energy intake, labs, weights, I/O's, renal profile  Reason for Assessment: Consult  80 y.o. female  Admitting Dx: <principal problem not specified>  ASSESSMENT: Alyssa Knight is a 80 y.o. female with prior h/o chronic systolic heart failure, diet controlled dm, chronic gangrene of the toe, hypertension and stage 3 ckd, PVD, brought in by family for generalized weakness,and worsening leg edema over the last few weeks.  -Pt endorsed an unintentional wt loss of 20 lbs in one year (13.7% body weight) after she fell in 03/2013. Pt attributes some of this to fluid as she has hx of leg edemas/CHF -Has had decreased appetite since fall; however it is improving during admit. Pt eating <75% of meals. Was willing to incorporate Glucerna shake as a snack once daily to assist in weight maintenance  -Pt's family assists in preparing meals. Diet recall indicates pt eats 2-3 meals/day and snacks occasionally. Son encourages pt to eat small frequent meals -Has hx of CKD3, Crt currently as baseline -WOC evaluated pt on 4/20 for necrotic left great toe; no surgical interventions at this time. RN also noted pt with un-stageable pressure ulcer on r. heel   Height: Ht Readings from Last 1 Encounters:  02/12/14 5\' 7"  (1.702 m)    Weight: Wt Readings from Last 1 Encounters:  02/13/14 126 lb 6.4 oz (57.335 kg)    Ideal Body Weight: 135 lbs  % Ideal Body Weight: 93%  Wt Readings from Last 10 Encounters:   02/13/14 126 lb 6.4 oz (57.335 kg)  11/24/13 135 lb (61.236 kg)  10/14/13 135 lb (61.236 kg)  09/15/13 137 lb (62.143 kg)  09/02/13 137 lb (62.143 kg)  08/29/13 138 lb 12.8 oz (62.959 kg)  06/27/13 145 lb 8.1 oz (66 kg)  04/16/13 161 lb 6 oz (73.2 kg)  04/16/13 161 lb 6 oz (73.2 kg)    Usual Body Weight: 145 lbs  % Usual Body Weight: 87%  BMI:  Body mass index is 19.79 kg/(m^2).  Estimated Nutritional Needs: Kcal: 1500-1700 Protein: 55-70 Fluid: 1500 ml/daily  Skin: Necrotic left great toe, un-stageable PU on right heel  +2 edema on RLE, +1 edema on LLE  Diet Order: Sodium Restricted  EDUCATION NEEDS: -Education needs addressed   Intake/Output Summary (Last 24 hours) at 02/13/14 1503 Last data filed at 02/13/14 1438  Gross per 24 hour  Intake   1178 ml  Output   1626 ml  Net   -448 ml    Last BM: 4/20  Labs:   Recent Labs Lab 02/12/14 1307 02/13/14 0345  NA 140 141  K 4.8 4.4  CL 100 101  CO2 26 26  BUN 51* 50*  CREATININE 1.75* 1.71*  CALCIUM 9.3 8.8  GLUCOSE 186* 89    CBG (last 3)   Recent Labs  02/12/14 2147 02/13/14 0551 02/13/14 1112  GLUCAP 123* 87 153*    Scheduled Meds: . aspirin EC  81 mg Oral Daily  . feeding supplement (GLUCERNA SHAKE)  237 mL Oral Q24H  . furosemide  40 mg Intravenous Q12H  . heparin subcutaneous  5,000 Units Subcutaneous 3 times per day  . insulin aspart  0-9 Units Subcutaneous TID WC  . mupirocin cream   Topical Daily  . sodium chloride  3 mL Intravenous Q12H    Continuous Infusions:   Past Medical History  Diagnosis Date  . Diabetes mellitus without complication   . HTN (hypertension)   . CKD (chronic kidney disease)   . Systolic CHF   . CHF (congestive heart failure)   . COPD (chronic obstructive pulmonary disease)   . Peripheral vascular disease     Past Surgical History  Procedure Laterality Date  . Femur fracture surgery Left 03/2013    L distal femur lateral condyle s/p ORIF  . Orif  femur fracture Left 04/11/2013    Procedure: OPEN REDUCTION INTERNAL FIXATION (ORIF) DISTAL FEMUR FRACTURE- left;  Surgeon: Senaida Lange, MD;  Location: [facility] OR;  Service: Orthopedics;  Laterality: Left;    Lloyd Huger MS RD LDN Clinical Dietitian Pager:[phone]

## 2014-02-13 NOTE — Progress Notes (Signed)
CSW received request to meet with patient's son and wife to talk to patient about patient's d/c needs.  The son stated that he can no longer manage his mother's care at home as he is exhausted and he has been her sole caregiver for the past year.  He has been otu of work but is planning to return to work. His wife works full time and stated that she cannot continue to care for her mother-in-law.  CSW met with patient to discuss benefits of placement.  She has diabetes, wound care issues to her feet and problems with her heart/blood pressure. After extensive discussion with patient- she has agreed for SNF search. She would benefit from rehab and then family would seek long term care vs assisted living. Patient appears to have too large of an income to qualify for ALF Medicaid.  (Special Assistance).  Will initiate bed search; SW psychosocial assessment to follow.  Lorie Phenix. [place], [place]

## 2014-02-13 NOTE — Progress Notes (Signed)
Pharmacist Heart Failure Core Measure Documentation  Assessment: Alyssa Knight has an EF documented as 15-20% on 08/28/13 by ECHO.  Rationale: Heart failure patients with left ventricular systolic dysfunction (LVSD) and an EF < 40% should be prescribed an angiotensin converting enzyme inhibitor (ACEI) or angiotensin receptor blocker (ARB) at discharge unless a contraindication is documented in the medical record.  This patient is not currently on an ACEI or ARB for HF.  This note is being placed in the record in order to provide documentation that a contraindication to the use of these agents is present for this encounter.  ACE Inhibitor or Angiotensin Receptor Blocker is contraindicated (specify all that apply)  []   ACEI allergy AND ARB allergy []   Angioedema []   Moderate or severe aortic stenosis []   Hyperkalemia []   Hypotension []   Renal artery stenosis [x]   Worsening renal function, preexisting renal disease or dysfunction--CKD 4   [name] 02/13/2014 2:03 PM

## 2014-02-13 NOTE — Care Management Note (Addendum)
  Page 1 of 1   02/13/2014     10:41:22 AM CARE MANAGEMENT NOTE 02/13/2014  Patient:  NATSUE, GRONOWSKI   Account Number:  [id number]  Date Initiated:  02/13/2014  Documentation initiated by:  Donato Schultz  Subjective/Objective Assessment:   Admitted with CHF     Action/Plan:   CM to follow for disposition needs   Anticipated DC Date:  02/16/2014   Anticipated DC Plan:  HOME/SELF CARE      DC Planning Services  CM consult      Choice offered to / List presented to:             Status of service:  In process, will continue to follow Medicare Important Message given?   (If response is "NO", the following Medicare IM given date fields will be blank) Date Medicare IM given:   Date Additional Medicare IM given:    Discharge Disposition:    Per UR Regulation:  Reviewed for med. necessity/level of care/duration of stay  If discussed at Long Length of Stay Meetings, dates discussed:    Comments:  02/13/2014 Social:  From home with Children PT RECS:  HH PT DME RECS: None Intensity of Service:  IV Lasix BID and IV ABX Dispositon Plan Pending Quinnley Colasurdo RN, BSN, MSHL, CCM 02/13/2014

## 2014-02-13 NOTE — Progress Notes (Signed)
VASCULAR LAB PRELIMINARY  PRELIMINARY  PRELIMINARY  PRELIMINARY  Left lower extremity venous Doppler completed.    Preliminary report:  There is no DVT or SVT noted in the left lower extremity.   Kern Alberta, RVT 02/13/2014, 9:36 AM

## 2014-02-13 NOTE — Consult Note (Signed)
WOC wound consult note Reason for Consult: Pt has been followed in the past by [facility] service for necrotic left great toe.  She does not want surgical management and has been told to use epsom salt soaks when at home and apply antibiotic ointment.   Left great toe tip with 100% dry eschar to 2X2cm area; small amt odor, no drainage or open wound. Right great toe with missing toenail; open wound to nail bed .6X.6X.2cm, 100% red and dry, no odor or drainage. Right inner foot .2X.2X.2cm, dry red wound bed without odor or drainage. Appears to be site of previous callous which has opened and evolved into full thickness wound. Generalized erythremia and edema to BLE. Plan: Bactroban to provide moist healing and antimicrobial benefits.  Foam dressing to protect right toe and inner foot from further injury. Please re-consult if further assistance is needed.  Thank-you,  Cammie Mcgee MSN, RN, CWOCN, [place], CNS [phone]

## 2014-02-13 NOTE — Evaluation (Signed)
Occupational Therapy Evaluation Patient Details Name: Alyssa BishopDorothy M Knight MRN: [id number] DOB: 1917/02/05 Today's Date: 02/13/2014    History of Present Illness 80 y.o. female with prior h/o chronic systolic heart failure, diet controlled dm, chronic gangrene of the toe, hypertension and stage 3 ckd, PVD,  brought in by family for generalized weakness,and worsening leg edema over the last few weeks. She is then referred to medical service for admission for acute CHF.    Clinical Impression   Pt demonstrates decline in function with ADLs and ADL mobility safety with decreased strength, balance and endurance. Pt would benefit from acute OT services to address impairments to increase level of function and safety. Pt lives at home with her son/family and they provide assist for safety    Follow Up Recommendations  No OT follow up;Supervision/Assistance - 24 hour    Equipment Recommendations  None recommended by OT    Recommendations for Other Services       Precautions / Restrictions Precautions Precautions: Fall Restrictions Weight Bearing Restrictions: No      Mobility Bed Mobility               General bed mobility comments: pt seated in recliner upon entering room  Transfers Overall transfer level: Needs assistance Equipment used: Rolling walker (2 wheeled) Transfers: Sit to/from Stand Sit to Stand: Min assist         General transfer comment: cues for hand placement, stand - sit safety, using grab bars    Balance Overall balance assessment: Needs assistance Sitting-balance support: No upper extremity supported;Feet supported Sitting balance-Leahy Scale: Good     Standing balance support: Bilateral upper extremity supported;Single extremity supported;During functional activity Standing balance-Leahy Scale: Poor                              ADL Overall ADL's : Needs assistance/impaired     Grooming: Wash/dry hands;Wash/dry face;Brushing  hair;Standing;Min guard;Minimal assistance   Upper Body Bathing: Supervision/ safety;Set up;Min guard;Sitting;Standing   Lower Body Bathing: Moderate assistance;Sit to/from stand   Upper Body Dressing : Supervision/safety;Set up;Min guard;Standing;Sitting   Lower Body Dressing: Moderate assistance;Sit to/from stand   Toilet Transfer: Minimal assistance;Regular Toilet;Grab bars;RW StatisticianToilet Transfer Details (indicate cue type and reason): cues for hand placement, stand - sit safety, using grab bars Toileting- Clothing Manipulation and Hygiene: Minimal assistance;Sit to/from stand   Tub/ Engineer, structuralhower Transfer: Minimal assistance;Grab bars;3 in 1 Tub/Shower Transfer Details (indicate cue type and reason): cues for hand placement, stand - sit safety, using grab bars Functional mobility during ADLs: Minimal assistance;Cueing for safety       Vision  wears glasses prn for reading per pt report                              Pertinent Vitals/Pain No c/o pain, VSS     Hand Dominance Right   Extremity/Trunk Assessment Upper Extremity Assessment Upper Extremity Assessment: Generalized weakness   Lower Extremity Assessment Lower Extremity Assessment: Defer to PT evaluation   Cervical / Trunk Assessment Cervical / Trunk Assessment: Kyphotic   Communication Communication Communication: No difficulties   Cognition Arousal/Alertness: Awake/alert Behavior During Therapy: WFL for tasks assessed/performed Overall Cognitive Status: Within Functional Limits for tasks assessed                     General Comments    Pt pleasant, cooperative and jovial  Home Living Family/patient expects to be discharged to:: Private residence Living Arrangements: Children Available Help at Discharge: Family;Available 24 hours/day Type of Home: House Home Access: Ramped entrance     Home Layout: One level     Bathroom Shower/Tub: Chief Strategy Officer:  Standard     Home Equipment: Environmental consultant - 2 wheels;Cane - single point;Adaptive equipment;Tub bench;Bedside commode Adaptive Equipment: Reacher        Prior Functioning/Environment Level of Independence: Needs assistance  Gait / Transfers Assistance Needed: assist with walker for in home ambulation, uses wheelchair for outings     Comments: children assist with mobility and ADLs    OT Diagnosis: Generalized weakness   OT Problem List: Decreased strength;Decreased activity tolerance;Impaired balance (sitting and/or standing);Decreased safety awareness   OT Treatment/Interventions: Self-care/ADL training;Therapeutic exercise;Patient/family education;Neuromuscular education;Balance training;Therapeutic activities    OT Goals(Current goals can be found in the care plan section) Acute Rehab OT Goals Patient Stated Goal: go home OT Goal Formulation: With patient Time For Goal Achievement: 02/20/14 Potential to Achieve Goals: Good ADL Goals Pt Will Perform Grooming: with supervision;with set-up;standing;with min guard assist Pt Will Perform Lower Body Bathing: with min assist;sitting/lateral leans;sit to/from stand;with min guard assist Pt Will Perform Lower Body Dressing: with min assist;sitting/lateral leans;sit to/from stand;with min guard assist Pt Will Transfer to Toilet: with min guard assist;with supervision;ambulating;regular height toilet;grab bars (3 in  1 over toilet) Pt Will Perform Toileting - Clothing Manipulation and hygiene: with min assist;with min guard assist;sit to/from stand Pt Will Perform Tub/Shower Transfer: tub bench;with min guard assist;with supervision  OT Frequency: Min 2X/week   Barriers to D/C:    none                     End of Session Equipment Utilized During Treatment: Rolling walker  Activity Tolerance: Patient tolerated treatment well Patient left: in chair;with call bell/phone within reach   Time: 1421-1442 OT Time Calculation (min):  21 min Charges:  OT General Charges $OT Visit: 1 Procedure OT Evaluation $Initial OT Evaluation Tier I: 1 Procedure OT Treatments $Therapeutic Activity: 8-22 mins G-Codes:    Lafe Garin Feb 16, 2014, 3:24 PM

## 2014-02-14 LAB — URINALYSIS, ROUTINE W REFLEX MICROSCOPIC
Bilirubin Urine: NEGATIVE
Glucose, UA: NEGATIVE mg/dL
Hgb urine dipstick: NEGATIVE
Ketones, ur: NEGATIVE mg/dL
Nitrite: NEGATIVE
Protein, ur: NEGATIVE mg/dL
SPECIFIC GRAVITY, URINE: 1.013 (ref 1.005–1.030)
UROBILINOGEN UA: 0.2 mg/dL (ref 0.0–1.0)
pH: 7 (ref 5.0–8.0)

## 2014-02-14 LAB — BASIC METABOLIC PANEL
BUN: 54 mg/dL — AB (ref 6–23)
CALCIUM: 8.9 mg/dL (ref 8.4–10.5)
CO2: 25 meq/L (ref 19–32)
CREATININE: 1.91 mg/dL — AB (ref 0.50–1.10)
Chloride: 98 mEq/L (ref 96–112)
GFR calc Af Amer: 25 mL/min — ABNORMAL LOW (ref >90)
GFR calc non Af Amer: 21 mL/min — ABNORMAL LOW (ref >90)
Glucose, Bld: 156 mg/dL — ABNORMAL HIGH (ref 70–99)
Potassium: 4.6 mEq/L (ref 3.7–5.3)
Sodium: 140 mEq/L (ref 137–147)

## 2014-02-14 LAB — URINE MICROSCOPIC-ADD ON

## 2014-02-14 LAB — GLUCOSE, CAPILLARY
GLUCOSE-CAPILLARY: 120 mg/dL — AB (ref 70–99)
Glucose-Capillary: 79 mg/dL (ref 70–99)
Glucose-Capillary: 162 mg/dL — ABNORMAL HIGH (ref 70–99)
Glucose-Capillary: 137 mg/dL — ABNORMAL HIGH (ref 70–99)

## 2014-02-14 MED ORDER — FUROSEMIDE 10 MG/ML IJ SOLN
40.0000 mg | Freq: Three times a day (TID) | INTRAMUSCULAR | Status: DC
  Administered 2014-02-14: 40 mg via INTRAVENOUS
  Filled 2014-02-14 (×3): qty 4

## 2014-02-14 MED ORDER — METOPROLOL TARTRATE 12.5 MG HALF TABLET
12.5000 mg | ORAL_TABLET | Freq: Two times a day (BID) | ORAL | Status: DC
  Administered 2014-02-14: 12.5 mg via ORAL
  Filled 2014-02-14 (×4): qty 1

## 2014-02-14 NOTE — Clinical Social Work Psychosocial (Addendum)
Clinical Social Work Department BRIEF PSYCHOSOCIAL ASSESSMENT 02/13/2014  Patient:  Alyssa Knight, Alyssa Knight     Account Number:  [id number]     Admit date:  02/12/2014  Clinical Social Worker:  Iona Coach  Date/Time:  02/13/2014 01:25 PM  Referred by:  Physician  Date Referred:  02/13/2014 Referred for  SNF Placement   Other Referral:   Interview type:  Other - See comment Other interview type:   Pateint, son and daughter-in-law    PSYCHOSOCIAL DATA Living Status:  WITH ADULT CHILDREN Admitted from facility:   Level of care:   Primary support name:  Alyssa Knight  [phone]  ([phone] Primary support relationship to patient:  CHILD, ADULT Degree of support available:   Strong support    Daughter in law- Alyssa Knight - very supportive as well    CURRENT CONCERNS Current Concerns  Post-Acute Placement   Other Concerns:   PT recommends home with [facility] but patient's son states he can no longer manage patient's care needs at home.    SOCIAL WORK ASSESSMENT / PLAN 80 year old female who was admitted from home where she current lives with her son Alyssa Knight and his wife. CSW was approached by patient's son today to discuss desire to seek SNF placement for his mother. He relates that her care has becomem more than what he can manage at home and he has been her sole caregiver for over a year.  Son relates that his mother requires constant, total care and attention at home and that she now has diabetic foot ulcers and heart failure issues.He states that patient will not help very much with her personal care at home and will usually just sit in her chair most of the day.  He admits to being overwhelmed and exhausted. His wife Alyssa Knight is a Marine scientist but she works full time.  Patient's son stated that he has been out of work but is planning to return to work soon.  he is very worried that his mother will not agree to placement. CSW met with patient who was noted to be fully alert and oriented. CSW spoke to  patient about her physical condition and how she was managing at home.  She was presented with idea of placement and was receptive to the idea as she knows that her son is exhausted and overwhelmed. She stated that sometimes she gets lonely and would enjoy being around other people.  CSW discussed bed search and patient agreed to same.  FL2 placed on chart for MD's signature.   Assessment/plan status:  Psychosocial Support/Ongoing Assessment of Needs Other assessment/ plan:   Information/referral to community resources:   SNF bed list provided to patient/family  Discussed ALF placement- patient's monthly income is over limited for ALF placement (Medicaid)  Does not have private pay funds.    PATIENT'S/FAMILY'S RESPONSE TO PLAN OF CARE: Patient is alert, oriented and extremely pleasant. Despite son's concerns- she was quite receptive to idea of placement and is invovled in her d/c plannning process. CSW and family will keep patient involve and informed in the placement process. Family wants to go and tour facilities once bed offers are in place.

## 2014-02-14 NOTE — Progress Notes (Signed)
CSW received call from patient's daughter- in-law Carley Hammed this afternoon. They have received bed offers and toured facilities. They have chosen [facility] and [address]. Plan d/c tomorrow if medically stable per MD.  Family will discuss with patient this evening.  Lorri Frederick. [place]  [phone]

## 2014-02-14 NOTE — Progress Notes (Signed)
[facility] PROGRESS NOTE  Filed Weights   02/12/14 1542 02/13/14 0539 02/14/14 0516  Weight: 57.9 kg (127 lb 10.3 oz) 57.335 kg (126 lb 6.4 oz) 57.1 kg (125 lb 14.1 oz)        Intake/Output Summary (Last 24 hours) at 02/14/14 0804 Last data filed at 02/14/14 0620  Gross per 24 hour  Intake    956 ml  Output   1001 ml  Net    -45 ml     Assessment/Plan: Acute systolic heart failure (EF 15%) - IV lasix, moderate UOP, BP borderline, increase low lasix. - Stricit I and o's daily weights, monitor electrolytes replete as needed.  -57.9 ->57.3-> 57.1 kg. - Start metoprolol low dose. PT midly tachycardic.  Gangrene of toe - Wound consultL Bactroban to provide moist healing and antimicrobial benefits. Foam dressing to protect right toe and inner foot from further injury  HTN (hypertension) - control with lasix.  Chronic renal insufficiency, stage III (moderate) - Cr at baseline 1.7.  Diabetes type 2, controlled: - good controlled.   Code Status: LIMITED  Family Communication: discussed with son and family at bedside, they wanted social worker consult for disposition. Please talkt ot he family prior to talking to the patient.  Disposition Plan: pending PT EVAL.     Consultants:  WOC  Procedures: ECHO: none  Antibiotics:  levaquin single dose 4.19.2015.  HPI/Subjective: Feels better. SOB improve.  Objective: Filed Vitals:   02/13/14 0539 02/13/14 1440 02/13/14 2115 02/14/14 0516  BP: 137/87 139/66 120/75 101/51  Pulse: 106 114 111 104  Temp: 98.4 F (36.9 C) 98.6 F (37 C) 97 F (36.1 C) 97.5 F (36.4 C)  TempSrc: Oral Oral Oral Oral  Resp: 22 20 19 18   Height:      Weight: 57.335 kg (126 lb 6.4 oz)   57.1 kg (125 lb 14.1 oz)  SpO2: 99% 97% 97% 95%     Exam:  General: Alert, awake, oriented x3, in no acute distress.  HEENT: No bruits, no goiter. +JVD Heart: Regular rate and rhythm,  Woody 2+ edema B/L. Lungs: Good air  movement,clear Abdomen: Soft, nontender, nondistended, positive bowel sounds.    Data Reviewed: Basic Metabolic Panel:  Recent Labs Lab 02/12/14 1307 02/13/14 0345  NA 140 141  K 4.8 4.4  CL 100 101  CO2 26 26  GLUCOSE 186* 89  BUN 51* 50*  CREATININE 1.75* 1.71*  CALCIUM 9.3 8.8   Liver Function Tests:  Recent Labs Lab 02/12/14 1307  AST 15  ALT <5  ALKPHOS 66  BILITOT 0.4  PROT 8.1  ALBUMIN 3.0*   No results found for this basename: LIPASE, AMYLASE,  in the last 168 hours No results found for this basename: AMMONIA,  in the last 168 hours CBC:  Recent Labs Lab 02/12/14 1307  WBC 8.6  NEUTROABS 7.0  HGB 10.8*  HCT 34.7*  MCV 90.4  PLT 257   Cardiac Enzymes:  Recent Labs Lab 02/12/14 1307  TROPONINI <0.30   BNP (last 3 results)  Recent Labs  09/02/13 0911 10/14/13 1436 02/12/14 1307  PROBNP 1779.0* 1535.0* 38493.0*   CBG:  Recent Labs Lab 02/13/14 0551 02/13/14 1112 02/13/14 1617 02/13/14 2124 02/14/14 0555  GLUCAP 87 153* 85 137* 79    No results found for this or any previous visit (from the past 240 hour(s)).   Studies: Dg Chest 2 View  02/12/2014   CLINICAL DATA:  Generalized weakness and fatigue.  EXAM: CHEST  2 VIEW  COMPARISON:  08/27/2013.  FINDINGS: Enlarged cardiomediastinal silhouette. Multiple calcifications are re- demonstrated throughout the right chest. Moderate size left pleural effusion. Airspace disease with left lower lobe consolidation not excluded. No pneumothorax. Reduced volume right hemithorax. Osteopenia. Considerable worsening aeration.  IMPRESSION: Marked worsening left base process with pleural effusion. Consolidative LLL airspace disease not excluded.   Electronically Signed   By: Davonna BellingJohn  Curnes M.D.   On: 02/12/2014 14:12    Scheduled Meds: . aspirin EC  81 mg Oral Daily  . feeding supplement (GLUCERNA SHAKE)  237 mL Oral Q24H  . furosemide  40 mg Intravenous Q12H  . heparin subcutaneous  5,000 Units  Subcutaneous 3 times per day  . insulin aspart  0-9 Units Subcutaneous TID WC  . mupirocin cream   Topical Daily  . sodium chloride  3 mL Intravenous Q12H   Continuous Infusions:    Marinda Elkbraham Feliz Ortiz  [facility] Pager [phone] If 8PM-8AM, please contact night-coverage at [email or web address], password [email or web address] 02/14/2014, 8:04 AM  LOS: 2 days

## 2014-02-14 NOTE — Progress Notes (Signed)
Physical Therapy Treatment Patient Details Name: Alyssa BishopDorothy M Knight MRN: [id number] DOB: 1917/09/23 Today's Date: 02/14/2014    History of Present Illness 80 y.o. female with prior h/o chronic systolic heart failure, diet controlled dm, chronic gangrene of the toe, hypertension and stage 3 ckd, PVD, brought in by family for generalized weakness,and worsening leg edema over the last few weeks. Admission for acute CHF.     PT Comments    Pt very motivated, however does fatigue quickly. Required several seated rest breaks throughout session. Noted family can no longer provide 24 hour care at home (son returning to work). Pt will benefit from SNF for continued therapies to increase independence and safety with mobility.   Follow Up Recommendations  SNF     Equipment Recommendations  None recommended by PT    Recommendations for Other Services       Precautions / Restrictions Precautions Precautions: Fall    Mobility  Bed Mobility               General bed mobility comments: pt seated in recliner upon entering room  Transfers Overall transfer level: Needs assistance Equipment used: Rolling walker (2 wheeled) Transfers: Sit to/from Stand Sit to Stand: Min guard         General transfer comment: pt demonstrated proper use of RW/sequencing with RW; minguard assist when pt attempting to stand with hands on her knees (instead of using armrests); pt fearful and unable to even clear her hips off chair  Ambulation/Gait Ambulation/Gait assistance: Min guard Ambulation Distance (Feet): 50 Feet Assistive device: Rolling walker (2 wheeled) Gait Pattern/deviations: Step-to pattern;Decreased stance time - left;Decreased stride length;Trunk flexed Gait velocity: decreased Gait velocity interpretation: Below normal speed for age/gender General Gait Details: pt reports painful Lt toes when walking; educated on step-to pattern with Lt foot leading and pt able to duplicate x 30 ft after  instruction   Stairs            Wheelchair Mobility    Modified Rankin (Stroke Patients Only)       Balance Overall balance assessment: Needs assistance         Standing balance support: No upper extremity supported Standing balance-Leahy Scale: Fair Standing balance comment: stood x 20 seconds without UE support; steady                    Cognition Arousal/Alertness: Awake/alert Behavior During Therapy: WFL for tasks assessed/performed Overall Cognitive Status: Within Functional Limits for tasks assessed                      Exercises General Exercises - Lower Extremity Long Arc Quad: AROM;Both;10 reps;Seated Heel Slides: AROM;Both;10 reps;Seated (reclined) Toe Raises: AROM;Both;10 reps;Seated (pressing heels down into floor; lifting toes) Heel Raises: AROM;Both;10 reps;Seated Other Exercises Other Exercises: sit to stand from recliner x 5 (attempted with UE support on her knees to decr reliance on UEs and incr strength of LEs, however pt fearful and unable. Was able to use hands on knees for stand to sit, required min assist to control descent for last 12" of descent due to weakness and lack of control    General Comments        Pertinent Vitals/Pain Reports general chronic arthritic pain; not rated    Home Living                      Prior Function  PT Goals (current goals can now be found in the care plan section) Acute Rehab PT Goals Patient Stated Goal: get stronger Progress towards PT goals: Progressing toward goals    Frequency  Min 2X/week    PT Plan Discharge plan needs to be updated    Co-evaluation             End of Session Equipment Utilized During Treatment: Gait belt Activity Tolerance: Patient tolerated treatment well Patient left: in chair;with call bell/phone within reach     Time: 1122-1146 PT Time Calculation (min): 24 min  Charges:  $Gait Training: 8-22 mins $Therapeutic  Exercise: 8-22 mins                    G CodesScherrie Knight Alyssa Knight February 23, 2014, 12:02 PM Pager [phone]

## 2014-02-14 NOTE — Clinical Social Work Placement (Addendum)
    Clinical Social Work Department CLINICAL SOCIAL WORK PLACEMENT NOTE 02/15/2014  Patient:  ROSIE, REIF  Account Number:  [id number] Admit date:  02/12/2014  Clinical Social Worker:  Lupita Leash Noriko Macari, LCSWA  Date/time:  02/14/2014 10:30 AM  Clinical Social Work is seeking post-discharge placement for this patient at the following level of care:   SKILLED NURSING   (*CSW will update this form in Epic as items are completed)   02/14/2014  Patient/family provided with [facility] Department of Clinical Social Work's list of facilities offering this level of care within the geographic area requested by the patient (or if unable, by the patient's family).  02/14/2014  Patient/family informed of their freedom to choose among providers that offer the needed level of care, that participate in Medicare, Medicaid or managed care program needed by the patient, have an available bed and are willing to accept the patient.  02/14/2014  Patient/family informed of [facility]' ownership interest in [facility], as well as of the fact that they are under no obligation to receive care at this facility.  PASARR submitted to EDS on 02/14/2014 PASARR number received from EDS on 02/14/2014  FL2 transmitted to all facilities in geographic area requested by pt/family on  02/14/2014 FL2 transmitted to all facilities within larger geographic area on   Patient informed that his/her managed care company has contracts with or will negotiate with  certain facilities, including the following:   NA     Patient/family informed of bed offers received:  02/14/2014 Patient chooses bed at [facility] Physician recommends and patient chooses bed at    Patient to be transferred to [facility] on  02/15/2014 Patient to be transferred to facility by Ambulance  Sharin Mons)  The following physician request were entered in Epic:   Additional Comments: 02/15/14  Ok  per MD for d/c today to SNF. OK per patient and family. Patient is not very happy with d/c plan to SNF and really wanted to go home but she is able to understand that her son can no longer manage her care at home.  She is agreeable to SNF at this time. Family is very relieved that she agrees to go to SNF. Nursing notified to call report. No further CSW needs identified. CSW signing off.

## 2014-02-15 LAB — BASIC METABOLIC PANEL
BUN: 57 mg/dL — ABNORMAL HIGH (ref 6–23)
CALCIUM: 9.4 mg/dL (ref 8.4–10.5)
CHLORIDE: 103 meq/L (ref 96–112)
CO2: 29 mEq/L (ref 19–32)
Creatinine, Ser: 1.94 mg/dL — ABNORMAL HIGH (ref 0.50–1.10)
GFR calc Af Amer: 24 mL/min — ABNORMAL LOW (ref >90)
GFR calc non Af Amer: 21 mL/min — ABNORMAL LOW (ref >90)
Glucose, Bld: 105 mg/dL — ABNORMAL HIGH (ref 70–99)
Potassium: 4.7 mEq/L (ref 3.7–5.3)
Sodium: 146 mEq/L (ref 137–147)

## 2014-02-15 LAB — GLUCOSE, CAPILLARY
GLUCOSE-CAPILLARY: 166 mg/dL — AB (ref 70–99)
Glucose-Capillary: 83 mg/dL (ref 70–99)

## 2014-02-15 MED ORDER — MUPIROCIN CALCIUM 2 % EX CREA: TOPICAL_CREAM | Freq: Every day | CUTANEOUS | Status: DC

## 2014-02-15 MED ORDER — FUROSEMIDE 80 MG PO TABS: 80.0000 mg | ORAL_TABLET | Freq: Every day | ORAL | Status: DC

## 2014-02-15 MED ORDER — FUROSEMIDE 40 MG PO TABS
60.0000 mg | ORAL_TABLET | Freq: Every day | ORAL | Status: DC
  Administered 2014-02-15: 60 mg via ORAL
  Filled 2014-02-15: qty 1

## 2014-02-15 NOTE — Progress Notes (Signed)
Occupational Therapy Treatment Patient Details Name: JAMINA FLORESGARCIA MRN: [id number] DOB: Jan 24, 1917 Today's Date: 02/15/2014    History of present illness 81 y.o. female with prior h/o chronic systolic heart failure, diet controlled dm, chronic gangrene of the toe, hypertension and stage 3 ckd, PVD, brought in by family for generalized weakness,and worsening leg edema over the last few weeks. Admission for acute CHF.    OT comments  Pt making good progress with functional goals and should continue with acute OT services to increase level of function and safety  Follow Up Recommendations  SNF;Supervision/Assistance - 24 hour    Equipment Recommendations  None recommended by OT;Other (comment) (TBD)    Recommendations for Other Services      Precautions / Restrictions Precautions Precautions: Fall Restrictions Weight Bearing Restrictions: No       Mobility Bed Mobility               General bed mobility comments: pt seated on BSC upon entering room  Transfers Overall transfer level: Needs assistance Equipment used: Rolling walker (2 wheeled) Transfers: Sit to/from Stand Sit to Stand: Min guard              Balance   Sitting-balance support: No upper extremity supported;Feet supported Sitting balance-Leahy Scale: Good     Standing balance support: Single extremity supported;No upper extremity supported;During functional activity Standing balance-Leahy Scale: Fair                     ADL       Grooming: Wash/dry hands;Wash/dry face;Standing;Min guard       Lower Body Bathing: Sit to/from stand;Minimal assistance       Lower Body Dressing: Moderate assistance;Sit to/from stand;Minimal assistance   Toilet Transfer: Grab bars;RW;Min guard;BSC   Toileting- Clothing Manipulation and Hygiene: Minimal assistance;Sit to/from stand   Tub/ Shower Transfer: Minimal assistance;3 in 1;Min guard   Functional mobility during ADLs: Cueing for  safety;Min guard        Vision  wears reading glasses                              Cognition   Behavior During Therapy: [facility] for tasks assessed/performed Overall Cognitive Status: Within Functional Limits for tasks assessed                                                  General Comments  Pt very pleasant and cooperative    Pertinent Vitals/ Pain       No c/o pain, VSS                Frequency Min 2X/week     Progress Toward Goals  OT Goals(current goals can now be found in the care plan section)  Progress towards OT goals: Progressing toward goals  Acute Rehab OT Goals Patient Stated Goal: get stronger  Plan Discharge plan needs to be updated                     End of Session Equipment Utilized During Treatment: Rolling walker;Other (comment) [facility])   Activity Tolerance Patient tolerated treatment well   Patient Left in chair;with call bell/phone within reach             Time: 1129-1150 OT Time Calculation (  min): 21 min  Charges: OT General Charges $OT Visit: 1 Procedure OT Treatments $Self Care/Home Management : 8-22 mins  Lafe GarinDenise J Jaskiran Pata 02/15/2014, 1:52 PM

## 2014-02-15 NOTE — Discharge Summary (Addendum)
Physician Discharge Summary  TALEA WIRTANEN OVP:[id number] DOB: 1917/07/28 DOA: 02/12/2014  PCP: Thora Lance, MD  Admit date: 02/12/2014 Discharge date: 02/15/2014  Recommendations for Outpatient Follow-up:  1. F/u with Dr. Delton See within 1 week of discharge for f/u CHF 2. Referral to Vascular surgery for ongoing monitoring of bilateral foot gangrene if patient determines she would like more aggressive measures.   3. Apply bactroban to left great toe, right great toe and right inner anterior foot wounds daily.  Cover right toe and anterior foot wounds with foam dressing and change q5 days or prn soiling. 4. Daily weights and call Dr. Delton See if she gains more than 3-lbs in 1 day or 5-lbs over 7 days.   5. Elevate legs when resting, TED hose if able 6. Palliative care to follow please.    Discharge Diagnoses:  Active Problems:   HTN (hypertension)   Chronic renal insufficiency, stage III (moderate)   Diabetes type 2, controlled   Acute systolic heart failure   Gangrene of toe   Discharge Condition: Stable, improved  Diet recommendation: Low-sodium  Wt Readings from Last 3 Encounters:  02/15/14 56.7 kg (125 lb)  11/24/13 61.236 kg (135 lb)  10/14/13 61.236 kg (135 lb)    History of present illness:  Alyssa Knight is a 80 y.o. female with prior h/o chronic systolic heart failure, diet controlled dm, chronic gangrene of the toe, hypertension and stage 3 ckd, PVD, brought in by family for generalized weakness,and worsening leg edema over the last few weeks. On arrival to ED, a probnp was ordered, came back elevated at 38,000's, . Her CXR showed slight worsening of her left pleural effusion. She denies fever or chills. She does not have leukocytosis. Her leg edema have worsened and her left foot looks more swollen than right foot. She is then referred to medical service for admission for acute CHF.   Hospital Course:   Acute systolic heart failure exacerbation with mild increase in  her pleural effusion and lower extremity edema. Her weight is down approximately 10 pounds from when she was previously hospitalized.  Although she previously required thoracentesis, as she remains stable on room air during this admission. Her pleural effusions are thought to be due to her heart failure. She was started on Lasix 40 mg IV every 8 hours, and due to urinary incontinence, strict ins and outs could not be recorded. Her weight decreased by approximately 1 kg, and her lower extremity edema improved. She is advised a low-salt diet and increase Lasix to 80 mg once daily.  She should followup with her cardiologist for repeat BMP and weight check in approximately one week.  I concerned that this patient has a poor prognosis due to to her weight loss, difficulty with diuresis secondary to hypotension, deconditioning and progressive gangrene.  She developed mild increase in BUN and creatinine prior to discharge.  She is not on BB or ACEI due to hypotension and CKD.    Diet controlled diabetes mellitus, remains stable.  Gangrene of the bilateral first toes, peripheral vascular disease. She was seen by wound care. She does not want any aggressive surgery or treatment. She may followup with either her primary care doctor or her cardiologist.   Leg edema, venous duplex negative for DVT.  Anemia likely of chronic disease. Defer to primary care doctor for anemia workup if not already complete.  CKD stage IV, stable.  Minimize nephrotoxins and renally dose medications.    Procedures:  CXR  Duplex  lower extremities  Consultations:  None  Discharge Exam: Filed Vitals:   02/15/14 0552  BP: 96/50  Pulse: 85  Temp: 97.4 F (36.3 C)  Resp: 18   Filed Vitals:   02/14/14 2036 02/14/14 2255 02/15/14 0322 02/15/14 0552  BP: 96/54 102/49  96/50  Pulse: 105 101  85  Temp: 98.4 F (36.9 C)   97.4 F (36.3 C)  TempSrc: Oral   Oral  Resp: 18   18  Height:      Weight:   56.7 kg (125 lb)    SpO2: 97%   95%    General:  African American female, no acute distress  Cardiovascular:  regular rate and rhythm, 1/6 systolic murmur, positive gallop Respiratory:  diminished at the bilateral bases, no focal rales, rhonchi, or wheeze, no increased work of breathing   abdomen: Normal active bowel sounds, soft, nondistended, nontender MSK: 2+ slow pitting edema bilateral feet ankles and calves, mild erythema of bilateral feet. Dry gangrene of the tip of the first toe on left foot, where gangrene of the tip of the toe of the first toe right foot.  Discharge Instructions      Discharge Orders   Future Orders Complete By Expires   (HEART FAILURE PATIENTS) Call MD:  Anytime you have any of the following symptoms: 1) 3 pound weight gain in 24 hours or 5 pounds in 1 week 2) shortness of breath, with or without a dry hacking cough 3) swelling in the hands, feet or stomach 4) if you have to sleep on extra pillows at night in order to breathe.  As directed    Call MD for:  difficulty breathing, headache or visual disturbances  As directed    Call MD for:  extreme fatigue  As directed    Call MD for:  hives  As directed    Call MD for:  persistant dizziness or light-headedness  As directed    Call MD for:  persistant nausea and vomiting  As directed    Call MD for:  severe uncontrolled pain  As directed    Call MD for:  temperature >100.4  As directed    Diet - low sodium heart healthy  As directed    Discharge instructions  As directed    Increase activity slowly  As directed        Medication List    STOP taking these medications       neomycin-bacitracin-polymyxin ointment  Commonly known as:  NEOSPORIN      TAKE these medications       acetaminophen 500 MG tablet  Commonly known as:  TYLENOL  Take 500 mg by mouth every 6 (six) hours as needed for pain (for pain in leg).     aspirin EC 81 MG tablet  Take 81 mg by mouth daily.     dextromethorphan 30 MG/5ML liquid  Commonly  known as:  DELSYM  Take 30 mg by mouth as needed for cough.     furosemide 80 MG tablet  Commonly known as:  LASIX  Take 1 tablet (80 mg total) by mouth daily.     multivitamin with minerals Tabs tablet  Take 1 tablet by mouth daily.     mupirocin cream 2 %  Commonly known as:  BACTROBAN  Apply topically daily. Apply Bactroban to left great toe, right great toe, and right inner anterior foot wounds Q day.  Cover right toe/anterior foot wounds with foam dressing and change Q 5  days or PRN soiling.       Follow-up Information   Follow up with Thora Lance, MD. Schedule an appointment as soon as possible for a visit in 1 month.   Specialty:  Family Medicine   Contact information:   [address], [address] [place] [address] [phone]       Follow up with Lars Masson, MD. Schedule an appointment as soon as possible for a visit in 1 week.   Specialty:  Cardiology   Contact information:   [address] [place] [address] [phone]        The results of significant diagnostics from this hospitalization (including imaging, microbiology, ancillary and laboratory) are listed below for reference.    Significant Diagnostic Studies: Dg Chest 2 View  02/12/2014   CLINICAL DATA:  Generalized weakness and fatigue.  EXAM: CHEST  2 VIEW  COMPARISON:  08/27/2013.  FINDINGS: Enlarged cardiomediastinal silhouette. Multiple calcifications are re- demonstrated throughout the right chest. Moderate size left pleural effusion. Airspace disease with left lower lobe consolidation not excluded. No pneumothorax. Reduced volume right hemithorax. Osteopenia. Considerable worsening aeration.  IMPRESSION: Marked worsening left base process with pleural effusion. Consolidative LLL airspace disease not excluded.   Electronically Signed   By: Davonna Belling M.D.   On: 02/12/2014 14:12    Microbiology: No results found for this or any previous visit (from the past 240  hour(s)).   Labs: Basic Metabolic Panel:  Recent Labs Lab 02/12/14 1307 02/13/14 0345 02/14/14 1010 02/15/14 0500  NA 140 141 140 146  K 4.8 4.4 4.6 4.7  CL 100 101 98 103  CO2 26 26 25 29   GLUCOSE 186* 89 156* 105*  BUN 51* 50* 54* 57*  CREATININE 1.75* 1.71* 1.91* 1.94*  CALCIUM 9.3 8.8 8.9 9.4   Liver Function Tests:  Recent Labs Lab 02/12/14 1307  AST 15  ALT <5  ALKPHOS 66  BILITOT 0.4  PROT 8.1  ALBUMIN 3.0*   No results found for this basename: LIPASE, AMYLASE,  in the last 168 hours No results found for this basename: AMMONIA,  in the last 168 hours CBC:  Recent Labs Lab 02/12/14 1307  WBC 8.6  NEUTROABS 7.0  HGB 10.8*  HCT 34.7*  MCV 90.4  PLT 257   Cardiac Enzymes:  Recent Labs Lab 02/12/14 1307  TROPONINI <0.30   BNP: BNP (last 3 results)  Recent Labs  09/02/13 0911 10/14/13 1436 02/12/14 1307  PROBNP 1779.0* 1535.0* 38493.0*   CBG:  Recent Labs Lab 02/14/14 0555 02/14/14 1100 02/14/14 1614 02/14/14 2202 02/15/14 0627  GLUCAP 79 162* 120* 137* 83    Time coordinating discharge: 45 minutes  Signed:  Renae Fickle  [facility] 02/15/2014, 8:52 AM

## 2014-02-21 ENCOUNTER — Ambulatory Visit (INDEPENDENT_AMBULATORY_CARE_PROVIDER_SITE_OTHER): Payer: 59 | Admitting: Physician Assistant

## 2014-02-21 ENCOUNTER — Encounter: Payer: Self-pay | Admitting: Physician Assistant

## 2014-02-21 ENCOUNTER — Encounter (INDEPENDENT_AMBULATORY_CARE_PROVIDER_SITE_OTHER): Payer: Self-pay

## 2014-02-21 VITALS — BP 103/54 | HR 106 | Ht 67.0 in | Wt 126.0 lb

## 2014-02-21 DIAGNOSIS — I70269 Atherosclerosis of native arteries of extremities with gangrene, unspecified extremity: Secondary | ICD-10-CM

## 2014-02-21 DIAGNOSIS — J90 Pleural effusion, not elsewhere classified: Secondary | ICD-10-CM

## 2014-02-21 DIAGNOSIS — N183 Chronic kidney disease, stage 3 unspecified: Secondary | ICD-10-CM

## 2014-02-21 DIAGNOSIS — I10 Essential (primary) hypertension: Secondary | ICD-10-CM

## 2014-02-21 DIAGNOSIS — I5022 Chronic systolic (congestive) heart failure: Secondary | ICD-10-CM

## 2014-02-21 DIAGNOSIS — I428 Other cardiomyopathies: Secondary | ICD-10-CM

## 2014-02-21 DIAGNOSIS — I420 Dilated cardiomyopathy: Secondary | ICD-10-CM

## 2014-02-21 LAB — BASIC METABOLIC PANEL
BUN: 58 mg/dL — ABNORMAL HIGH (ref 6–23)
CHLORIDE: 98 meq/L (ref 96–112)
CO2: 31 mEq/L (ref 19–32)
CREATININE: 2.1 mg/dL — AB (ref 0.4–1.2)
Calcium: 8.8 mg/dL (ref 8.4–10.5)
GFR: 28.45 mL/min — ABNORMAL LOW (ref >60.00)
Glucose, Bld: 159 mg/dL — ABNORMAL HIGH (ref 70–99)
Potassium: 5.3 mEq/L — ABNORMAL HIGH (ref 3.5–5.1)
Sodium: 139 mEq/L (ref 135–145)

## 2014-02-21 NOTE — Patient Instructions (Addendum)
Your physician recommends that you return for lab work in:  Today - BMET (Dx 428.22).  Continue your current medications.  Your physician recommends that you schedule a follow-up appointment ON 03/08/14 @ 8:30 AM

## 2014-02-21 NOTE — Progress Notes (Signed)
[address], [address] [place], [place]  [address] Phone: [phone] Fax:  [phone]  Date:  02/21/2014   ID:  Alyssa Knight, DOB [birth year], MRN [id number]  PCP:  Thora Lance, MD  Cardiologist:  Dr. Tobias Alexander     History of Present Illness: Alyssa Knight is a 80 y.o. female with a hx of DCM, combined systolic and diastolic dysfunction, progressive CKD, history of TB, HTN, type 2 DM, normocytic anemia, left toe infection/dry gangrene, moderate to severe malnutrition and recurrent left pleural effusion s/p thoracenteses.  Last seen by Dr. Tobias Alexander in 09/2013.  She is being treated conservatively in regards to her DCM and CHF.    She was recently admitted 4/19-4/22 with acute on chronic systolic CHF.  L pleural effusion was noted to be increased.  She was diuresed with IV Lasix.  She did not require repeat thoracentesis.  She was d/c to [facility].  She is here with her son.  She is feeling better.  Her breathing is improved.  She sleeps on 3 pillows chronically.  No change.  She denies PND.  LE edema is improved.  She denies chest pain or syncope.    Studies:  - Echo (08/28/13):  EF 15-20%, diff HK, mild MR, severe LAE, mild RVE, reduced RVSF, mod to severe RAE. (EF 30-35% in 03/2013)     CXR (02/12/14: IMPRESSION: Marked worsening left base process with pleural effusion. Consolidative LLL airspace disease not excluded.   Recent Labs: 08/27/2013: TSH 3.617  02/12/2014: ALT <5; Hemoglobin 10.8*; Pro B Natriuretic peptide (BNP) 38493.0*  02/15/2014: Creatinine 1.94*; Potassium 4.7   Wt Readings from Last 3 Encounters:  02/21/14 126 lb (57.153 kg)  02/15/14 125 lb (56.7 kg)  11/24/13 135 lb (61.236 kg)     Past Medical History  Diagnosis Date  . Diabetes mellitus without complication   . HTN (hypertension)   . CKD (chronic kidney disease)   . Systolic CHF   . CHF (congestive heart failure)   . COPD (chronic obstructive pulmonary disease)   . Peripheral  vascular disease     Current Outpatient Prescriptions  Medication Sig Dispense Refill  . acetaminophen (TYLENOL) 500 MG tablet Take 500 mg by mouth every 6 (six) hours as needed for pain (for pain in leg).      Marland Kitchen aspirin EC 81 MG tablet Take 81 mg by mouth daily.      Marland Kitchen dextromethorphan (DELSYM) 30 MG/5ML liquid Take 30 mg by mouth as needed for cough.      . furosemide (LASIX) 80 MG tablet Take 1 tablet (80 mg total) by mouth daily.  30 tablet  0  . Multiple Vitamin (MULTIVITAMIN WITH MINERALS) TABS tablet Take 1 tablet by mouth daily.      . mupirocin cream (BACTROBAN) 2 % Apply topically daily. Apply Bactroban to left great toe, right great toe, and right inner anterior foot wounds Q day.  Cover right toe/anterior foot wounds with foam dressing and change Q 5 days or PRN soiling.  15 g  0   No current facility-administered medications for this visit.    Allergies:   Fish allergy and Actos   Social History:  The patient  reports that she has never smoked. She has never used smokeless tobacco. She reports that she does not drink alcohol or use illicit drugs.   Family History:  The patient's family history includes Heart attack in her paternal grandfather and paternal grandmother; Parkinson's disease in her  maternal grandfather.   ROS:  Please see the history of present illness.      All other systems reviewed and negative.   PHYSICAL EXAM: VS:  BP 103/54  Pulse 106  Ht 5\' 7"  (1.702 m)  Wt 126 lb (57.153 kg)  BMI 19.73 kg/m2 Well nourished, well developed, in no acute distress HEENT: normal Neck: no JVDat 90 degrees Cardiac:  normal S1, S2; RRR; no murmur Lungs:  Decreased breath sounds at L base; E->A changes 1/4 up chest on L, no rales Abd: soft, nontender, no hepatomegaly Ext: 1-2+ bilateral LE edema Skin: warm and dry Neuro:  CNs 2-12 intact, no focal abnormalities noted  EKG:  Sinus tachycardia, HR 106, PACs, Q waves in V1-V3, no significant change when compared to prior  tracings, increased artifact     ASSESSMENT AND PLAN:  1. Chronic systolic heart failure:  Volume appears improved. She has more volume to lose. Would keep her on the same dose of Lasix for now unless she starts to have worsening renal function.  2. Dilated cardiomyopathy:  Her BP cannot tolerate beta blocker.  She is not on ACEI due to CKD. 3. Pleural effusion, left:  She is stable from symptomatic point.  At this time, repeat CXR is not necessary. 4. Chronic renal insufficiency, stage III (moderate):  Repeat BMET today. 5. Atherosclerosis of native arteries of the extremities with gangrene:  [facility]. 6. HTN (hypertension):  As noted BP now running low. 7. Disposition:  F/u Dr. Tobias AlexanderKatarina Nelson or me in 1 month.   Signed, Tereso NewcomerScott Gar Glance, PA-C  02/21/2014 9:03 AM

## 2014-02-22 ENCOUNTER — Telehealth: Payer: Self-pay | Admitting: *Deleted

## 2014-02-22 NOTE — Telephone Encounter (Signed)
no answer, I will try again later  

## 2014-02-22 NOTE — Telephone Encounter (Signed)
Both the pt's daughter Carley Hammed as well as Francesca Jewett, RN over at [facility] have been made aware of lab results with verbal understanding from all. I will fax results to [facility] [phone] tonight.

## 2014-02-24 ENCOUNTER — Non-Acute Institutional Stay (SKILLED_NURSING_FACILITY): Payer: Medicare Other | Admitting: Nurse Practitioner

## 2014-02-24 DIAGNOSIS — N2889 Other specified disorders of kidney and ureter: Secondary | ICD-10-CM

## 2014-02-24 DIAGNOSIS — N183 Chronic kidney disease, stage 3 unspecified: Secondary | ICD-10-CM

## 2014-02-24 DIAGNOSIS — E119 Type 2 diabetes mellitus without complications: Secondary | ICD-10-CM

## 2014-02-24 DIAGNOSIS — I509 Heart failure, unspecified: Secondary | ICD-10-CM

## 2014-02-24 DIAGNOSIS — M109 Gout, unspecified: Secondary | ICD-10-CM

## 2014-02-24 DIAGNOSIS — I96 Gangrene, not elsewhere classified: Secondary | ICD-10-CM

## 2014-02-24 DIAGNOSIS — I10 Essential (primary) hypertension: Secondary | ICD-10-CM

## 2014-02-24 NOTE — Progress Notes (Signed)
Patient ID: Alyssa Knight, female   DOB: 1916/11/10, 80 y.o.   MRN: [id number]    Nursing Home Location:  [facility]   Place of Service: SNF (31)  PCP: Thora Lance, MD  Allergies  Allergen Reactions  . Fish Allergy Shortness Of Breath  . Actos [Pioglitazone] Other (See Comments)    May have dropped blood sugar too much-family unsure    Chief Complaint  Patient presents with  . Medical Management of Chronic Issues    HPI:  Alyssa Knight is a 80 y.o. female pmh h/o chronic systolic heart failure, diet controlled dm, chronic gangrene of the toe, hypertension and stage 3 ckd, PVD, brought in by family for generalized weakness,and worsening leg edema she was admitted and treated for acute systolic heart failure exacerbation with mild increase in her pleural effusion and lower extremity edema. Pt with pleural effusions that were thought to be due to her heart failure and was treated with lasix. She developed mild increase in BUN and creatinine prior to discharge. Pt is now at [facility] for rehab. Doing well but staff reported pt with increase swelling and tenderness to several joints in her hands. Son reports hx of gout that is diet controlled but with flare up abt once every 4 months. Has not been on as strict of a diet since in rehab and eating foods that generally will cause flare up. No fevers or chills. Wound care by treatment nurse without concerns Otherwise pt without complaints.   Review of Systems:  Review of Systems  Constitutional: Negative for fever and chills.  Respiratory: Negative for cough, shortness of breath and wheezing.   Cardiovascular: Negative for chest pain and leg swelling.  Gastrointestinal: Negative for abdominal pain, diarrhea and constipation.  Genitourinary: Negative for dysuria, urgency and frequency.  Musculoskeletal: Positive for joint pain (in fingers). Negative for myalgias.  Neurological: Positive for weakness. Negative for  dizziness and headaches.     Past Medical History  Diagnosis Date  . Diabetes mellitus without complication   . HTN (hypertension)   . CKD (chronic kidney disease)   . Systolic CHF   . CHF (congestive heart failure)   . COPD (chronic obstructive pulmonary disease)   . Peripheral vascular disease    Past Surgical History  Procedure Laterality Date  . Femur fracture surgery Left 03/2013    L distal femur lateral condyle s/p ORIF  . Orif femur fracture Left 04/11/2013    Procedure: OPEN REDUCTION INTERNAL FIXATION (ORIF) DISTAL FEMUR FRACTURE- left;  Surgeon: Senaida Lange, MD;  Location: [facility] OR;  Service: Orthopedics;  Laterality: Left;   Social History:   reports that she has never smoked. She has never used smokeless tobacco. She reports that she does not drink alcohol or use illicit drugs.  Family History  Problem Relation Age of Onset  . Parkinson's disease Maternal Grandfather   . Heart attack Paternal Grandmother   . Heart attack Paternal Grandfather     Medications: Patient's Medications  New Prescriptions   No medications on file  Previous Medications   ACETAMINOPHEN (TYLENOL) 500 MG TABLET    Take 500 mg by mouth every 6 (six) hours as needed for pain (for pain in leg).   ASPIRIN EC 81 MG TABLET    Take 81 mg by mouth daily.   DEXTROMETHORPHAN (DELSYM) 30 MG/5ML LIQUID    Take 30 mg by mouth as needed for cough.   FUROSEMIDE (LASIX) 20 MG TABLET  Take 60 mg by mouth daily.   MULTIPLE VITAMIN (MULTIVITAMIN WITH MINERALS) TABS TABLET    Take 1 tablet by mouth daily.   MUPIROCIN CREAM (BACTROBAN) 2 %    Apply topically daily. Apply Bactroban to left great toe, right great toe, and right inner anterior foot wounds Q day.  Cover right toe/anterior foot wounds with foam dressing and change Q 5 days or PRN soiling.  Modified Medications   No medications on file  Discontinued Medications   FUROSEMIDE (LASIX) 80 MG TABLET    Take 1 tablet (80 mg total) by mouth daily.      Physical Exam:  Filed Vitals:   02/24/14 1141  BP: 124/69  Pulse: 59  Temp: 97.3 F (36.3 C)  Resp: 20    Physical Exam  Constitutional: She is well-developed, well-nourished, and in no distress.  HENT:  Mouth/Throat: Oropharynx is clear and moist. No oropharyngeal exudate.  Eyes: Conjunctivae and EOM are normal. Pupils are equal, round, and reactive to light.  Neck: Normal range of motion. Neck supple.  Cardiovascular: Normal rate, regular rhythm and normal heart sounds.   Pulmonary/Chest: Effort normal and breath sounds normal.  Abdominal: Soft. Bowel sounds are normal.  Musculoskeletal: She exhibits edema and tenderness.  Left 5th and 2nd finger PIP joint and right 4th finger PIP swollen and tender   Neurological: She is alert.  Skin: Skin is warm and dry.  Psychiatric: Affect normal.     Labs reviewed: Basic Metabolic Panel:  Recent Labs  16/07/9605/17/14 2210  08/27/13 0250  02/14/14 1010 02/15/14 0500 02/21/14 0927  NA 135  < > 139  < > 140 146 139  K 4.7  < > 4.9  < > 4.6 4.7 5.3*  CL 101  < > 104  < > 98 103 98  CO2 28  < > 25  < > 25 29 31   GLUCOSE 156*  < > 114*  < > 156* 105* 159*  BUN 33*  < > 39*  < > 54* 57* 58*  CREATININE 2.02*  < > 1.57*  < > 1.91* 1.94* 2.1*  CALCIUM 8.2*  < > 8.6  < > 8.9 9.4 8.8  MG 2.1  --  2.2  --   --   --   --   PHOS 5.4*  --  5.1*  --   --   --   --   < > = values in this interval not displayed. Liver Function Tests:  Recent Labs  08/26/13 2230 08/27/13 1520 02/12/14 1307  AST 12 20 15   ALT <5 <5 <5  ALKPHOS 71 53 66  BILITOT 0.4 0.4 0.4  PROT 7.9 7.2 8.1  ALBUMIN 3.5 3.0* 3.0*   No results found for this basename: LIPASE, AMYLASE,  in the last 8760 hours No results found for this basename: AMMONIA,  in the last 8760 hours CBC:  Recent Labs  06/25/13 0910  08/26/13 2230 08/27/13 0250 02/12/14 1307  WBC 6.4  < > 9.2 9.4 8.6  NEUTROABS 4.8  --  7.6  --  7.0  HGB 10.5*  < > 11.3* 10.6* 10.8*  HCT 32.7*   < > 35.6* 33.0* 34.7*  MCV 86.7  < > 89.9 89.9 90.4  PLT 172  < > 187 158 257  < > = values in this interval not displayed. Cardiac Enzymes:  Recent Labs  08/27/13 0812 08/27/13 1437 02/12/14 1307  TROPONINI <0.30 <0.30 <0.30   BNP: No components found  with this basename: POCBNP,  CBG:  Recent Labs  02/14/14 2202 02/15/14 0627 02/15/14 1146  GLUCAP 137* 83 166*   TSH:  Recent Labs  04/15/13 0730 06/25/13 1330 08/27/13 0250  TSH 2.208 2.509 3.617   A1C: Lab Results  Component Value Date   HGBA1C 7.0* 02/13/2014   Lipid Panel:  Recent Labs  06/25/13 1126  CHOL 257*     Assessment/Plan 1. CHF (congestive heart failure) -controlled at this time, no worsening of symptoms  -conts to follow up with cardiology   2. HTN (hypertension) -stable on current lasix, not needing additional medications  3. Diabetes type 2, controlled -diet controlled -last A1c at goal for age  50. Chronic renal insufficiency, stage III (moderate) Cont to rise, lasix was reduced to 60 mg daily by cardiology and follow up BMP ordered  5. Gangrene of toe -stable, being followed by treatment nurse  6. Gout Will treat with prednisone 40 mg daily for 6 days -does not get flares frequently, cont diet modifications

## 2014-03-02 ENCOUNTER — Encounter: Payer: Self-pay | Admitting: Internal Medicine

## 2014-03-02 ENCOUNTER — Non-Acute Institutional Stay (SKILLED_NURSING_FACILITY): Payer: Medicare Other | Admitting: Internal Medicine

## 2014-03-02 DIAGNOSIS — I10 Essential (primary) hypertension: Secondary | ICD-10-CM

## 2014-03-02 DIAGNOSIS — E119 Type 2 diabetes mellitus without complications: Secondary | ICD-10-CM

## 2014-03-02 DIAGNOSIS — N184 Chronic kidney disease, stage 4 (severe): Secondary | ICD-10-CM

## 2014-03-02 DIAGNOSIS — I96 Gangrene, not elsewhere classified: Secondary | ICD-10-CM

## 2014-03-02 DIAGNOSIS — M109 Gout, unspecified: Secondary | ICD-10-CM

## 2014-03-02 DIAGNOSIS — I5023 Acute on chronic systolic (congestive) heart failure: Secondary | ICD-10-CM

## 2014-03-02 DIAGNOSIS — I70269 Atherosclerosis of native arteries of extremities with gangrene, unspecified extremity: Secondary | ICD-10-CM

## 2014-03-02 DIAGNOSIS — I5022 Chronic systolic (congestive) heart failure: Secondary | ICD-10-CM

## 2014-03-02 NOTE — Assessment & Plan Note (Signed)
Lasix 60 mg daily

## 2014-03-02 NOTE — Assessment & Plan Note (Signed)
Best BUN 51/best Cr 1.71

## 2014-03-02 NOTE — Progress Notes (Addendum)
MRN: [id number] Name: Alyssa Knight  Sex: female Age: 80 y.o. DOB: 01-21-1917  PSC #: Sonny Dandy Facility/Room: 224 Level Of Care: SNF Provider: Margit Hanks Emergency Contacts: Extended Emergency Contact Information Primary Emergency Contact: [facility] Address: [address]          [place], [place] [address] [place] Home Phone: [phone] Mobile Phone: [phone] Relation: Son Secondary Emergency Contact: Knauff,Eva Address: [address]          [place], [place] [address] [place] Home Phone: [phone] Relation: Daughter  Code Status: DNR  Allergies: Fish allergy and Actos  Chief Complaint  Patient presents with  . nursing home admission    HPI: Patient is 80 y.o. female who is admitted for generalized weakness after acute on chronic CHF.  Past Medical History  Diagnosis Date  . Diabetes mellitus without complication   . HTN (hypertension)   . CKD (chronic kidney disease)   . Systolic CHF   . CHF (congestive heart failure)   . COPD (chronic obstructive pulmonary disease)   . Peripheral vascular disease     Past Surgical History  Procedure Laterality Date  . Femur fracture surgery Left 03/2013    L distal femur lateral condyle s/p ORIF  . Orif femur fracture Left 04/11/2013    Procedure: OPEN REDUCTION INTERNAL FIXATION (ORIF) DISTAL FEMUR FRACTURE- left;  Surgeon: Senaida Lange, MD;  Location: [facility] OR;  Service: Orthopedics;  Laterality: Left;      Medication List       This list is accurate as of: 03/02/14 12:48 PM.  Always use your most recent med list.               acetaminophen 500 MG tablet  Commonly known as:  TYLENOL  Take 500 mg by mouth every 6 (six) hours as needed for pain (for pain in leg).     aspirin EC 81 MG tablet  Take 81 mg by mouth daily.     LASIX 20 MG tablet  Generic drug:  furosemide  Take 60 mg by mouth daily.     multivitamin with minerals Tabs tablet  Take 1 tablet by mouth  daily.     mupirocin cream 2 %  Commonly known as:  BACTROBAN  Apply topically daily. Apply Bactroban to left great toe, right great toe, and right inner anterior foot wounds Q day.  Cover right toe/anterior foot wounds with foam dressing and change Q 5 days or PRN soiling.        No orders of the defined types were placed in this encounter.    There is no immunization history for the selected administration types on file for this patient.  History  Substance Use Topics  . Smoking status: Never Smoker   . Smokeless tobacco: Never Used  . Alcohol Use: No    Family history is noncontributory    Review of Systems  DATA OBTAINED: from patient GENERAL: Feels well no fevers, fatigue, appetite changes SKIN: No itching, rash or wounds EYES: No eye pain, redness, discharge EARS: No earache, tinnitus, change in hearing NOSE: No congestion, drainage or bleeding  MOUTH/THROAT: No mouth or tooth pain, No sore throat  RESPIRATORY: No cough, wheezing, SOB CARDIAC: No chest pain, palpitations, lower extremity edema  GI: No abdominal pain, No N/V/D or constipation, No heartburn or reflux  GU: No dysuria, frequency or urgency, or incontinence  MUSCULOSKELETAL: No unrelieved bone/joint pain; finger with gout is much better  NEUROLOGIC: No headache, dizziness or focal weakness PSYCHIATRIC: No overt anxiety or sadness. Sleeps well. No behavior issue.   Filed Vitals:   03/02/14 1222  BP: 111/71  Pulse: 105  Temp: 98 F (36.7 C)  Resp: 20    Physical Exam  GENERAL APPEARANCE: Alert, conversant. Appropriately groomed. No acute distress.  SKIN: No diaphoresis rash HEAD: Normocephalic, atraumatic  EYES: Conjunctiva/lids clear. Pupils round, reactive. EOMs intact.  EARS: External exam WNL, canals clear. Hearing grossly normal.  NOSE: No deformity or discharge.  MOUTH/THROAT: Lips w/o lesions.  RESPIRATORY: Breathing is even, unlabored. Lung sounds are clear   CARDIOVASCULAR: Heart  irreg no murmurs, rubs or gallops. 1+ B peripheral edema.  GASTROINTESTINAL: Abdomen is soft, non-tender, not distended w/ normal bowel sounds. No mass, ventral or inguinal hernia. No organomegally GENITOURINARY: Bladder non tender, not distended  MUSCULOSKELETAL: No abnormal joints or musculature NEUROLOGIC: Oriented X3. Cranial nerves 2-12 grossly intact. Moves all extremities no tremor. PSYCHIATRIC: Mood and affect appropriate to situation, no behavioral issues  Patient Active Problem List   Diagnosis Date Noted  . Acute on chronic systolic heart failure 03/02/2014  . Gout 02/24/2014  . Dilated cardiomyopathy 02/21/2014  . CHF (congestive heart failure) 02/12/2014  . Gangrene of toe 02/12/2014  . Atherosclerosis of native arteries of the extremities with gangrene 11/24/2013  . Pleural effusion, left 04/13/2013  . Acute respiratory failure with hypoxia 04/11/2013  . Chronic systolic heart failure 04/11/2013  . HTN (hypertension) 04/10/2013  . Femur fracture, left 04/10/2013  . CKD (chronic kidney disease) stage 4, GFR 15-29 ml/min 04/10/2013  . Diabetes type 2, controlled 04/10/2013    CBC    Component Value Date/Time   WBC 8.6 02/12/2014 1307   RBC 3.84* 02/12/2014 1307   RBC 2.83* 04/14/2013 2100   HGB 10.8* 02/12/2014 1307   HCT 34.7* 02/12/2014 1307   PLT 257 02/12/2014 1307   MCV 90.4 02/12/2014 1307   LYMPHSABS 0.8 02/12/2014 1307   MONOABS 0.7 02/12/2014 1307   EOSABS 0.1 02/12/2014 1307   BASOSABS 0.0 02/12/2014 1307    CMP     Component Value Date/Time   NA 139 02/21/2014 0927   K 5.3* 02/21/2014 0927   CL 98 02/21/2014 0927   CO2 31 02/21/2014 0927   GLUCOSE 159* 02/21/2014 0927   BUN 58* 02/21/2014 0927   CREATININE 2.1* 02/21/2014 0927   CALCIUM 8.8 02/21/2014 0927   PROT 8.1 02/12/2014 1307   ALBUMIN 3.0* 02/12/2014 1307   AST 15 02/12/2014 1307   ALT <5 02/12/2014 1307   ALKPHOS 66 02/12/2014 1307   BILITOT 0.4 02/12/2014 1307   GFRNONAA 21* 02/15/2014 0500   GFRAA 24*  02/15/2014 0500    Assessment and Plan  Chronic systolic heart failure Acute on chronic with mild increase in her pleural effusion and lower extremity edema. Her weight is down approximately 10 pounds from when she was previously hospitalized. Although she previously required thoracentesis, as she remains stable on room air during this admission. Her pleural effusions are thought to be due to her heart failure. She was started on Lasix 40 mg IV every 8 hours, and due to urinary incontinence, strict ins and outs could not be recorded. Her weight decreased by approximately 1 kg, and her lower extremity edema improved. She is advised a low-salt diet and increase Lasix to 80 mg once daily. She should followup with her cardiologist for repeat BMP and weight check in approximately one week. I concerned that this patient  has a poor prognosis due to to her weight loss, difficulty with diuresis secondary to hypotension, deconditioning and progressive gangrene. She developed mild increase in BUN and creatinine prior to discharge. She is not on BB or ACEI due to hypotension and CKD.      HTN (hypertension) Lasix 60 mg daily  Diabetes type 2, controlled A1c is 7.0;diet controlled  Atherosclerosis of native arteries of the extremities with gangrene Pt no desire for surgery or aggressive tx  Gangrene of toe B first toes; wound care to follow  CKD (chronic kidney disease) stage 4, GFR 15-29 ml/min Best BUN 51/best Cr 1.71  Gout Improved with tx with prednisone    Margit Hanks, MD

## 2014-03-02 NOTE — Assessment & Plan Note (Signed)
Improved with tx with prednisone

## 2014-03-02 NOTE — Assessment & Plan Note (Addendum)
Acute on chronic with mild increase in her pleural effusion and lower extremity edema. Her weight is down approximately 10 pounds from when she was previously hospitalized. Although she previously required thoracentesis, as she remains stable on room air during this admission. Her pleural effusions are thought to be due to her heart failure. She was started on Lasix 40 mg IV every 8 hours, and due to urinary incontinence, strict ins and outs could not be recorded. Her weight decreased by approximately 1 kg, and her lower extremity edema improved. She is advised a low-salt diet and increase Lasix to 80 mg once daily. She should followup with her cardiologist for repeat BMP and weight check in approximately one week. I concerned that this patient has a poor prognosis due to to her weight loss, difficulty with diuresis secondary to hypotension, deconditioning and progressive gangrene. She developed mild increase in BUN and creatinine prior to discharge. She is not on BB or ACEI due to hypotension and CKD.

## 2014-03-02 NOTE — Assessment & Plan Note (Signed)
A1c is 7.0;diet controlled

## 2014-03-02 NOTE — Assessment & Plan Note (Signed)
Pt no desire for surgery or aggressive tx

## 2014-03-02 NOTE — Assessment & Plan Note (Signed)
B first toes; wound care to follow

## 2014-03-08 ENCOUNTER — Encounter: Payer: Self-pay | Admitting: Cardiology

## 2014-03-08 ENCOUNTER — Ambulatory Visit (INDEPENDENT_AMBULATORY_CARE_PROVIDER_SITE_OTHER): Payer: Medicare Other | Admitting: Cardiology

## 2014-03-08 DIAGNOSIS — I428 Other cardiomyopathies: Secondary | ICD-10-CM

## 2014-03-08 DIAGNOSIS — I5022 Chronic systolic (congestive) heart failure: Secondary | ICD-10-CM

## 2014-03-08 DIAGNOSIS — I509 Heart failure, unspecified: Secondary | ICD-10-CM

## 2014-03-08 DIAGNOSIS — I5023 Acute on chronic systolic (congestive) heart failure: Secondary | ICD-10-CM

## 2014-03-08 DIAGNOSIS — N184 Chronic kidney disease, stage 4 (severe): Secondary | ICD-10-CM

## 2014-03-08 DIAGNOSIS — I10 Essential (primary) hypertension: Secondary | ICD-10-CM

## 2014-03-08 DIAGNOSIS — I70269 Atherosclerosis of native arteries of extremities with gangrene, unspecified extremity: Secondary | ICD-10-CM

## 2014-03-08 DIAGNOSIS — I420 Dilated cardiomyopathy: Secondary | ICD-10-CM

## 2014-03-08 LAB — BASIC METABOLIC PANEL
BUN: 55 mg/dL — ABNORMAL HIGH (ref 6–23)
CO2: 35 mEq/L — ABNORMAL HIGH (ref 19–32)
Calcium: 8.6 mg/dL (ref 8.4–10.5)
Chloride: 96 mEq/L (ref 96–112)
Creatinine, Ser: 1.8 mg/dL — ABNORMAL HIGH (ref 0.4–1.2)
GFR: 33.19 mL/min — ABNORMAL LOW (ref >60.00)
Glucose, Bld: 135 mg/dL — ABNORMAL HIGH (ref 70–99)
Potassium: 4.4 mEq/L (ref 3.5–5.1)
Sodium: 138 mEq/L (ref 135–145)

## 2014-03-08 NOTE — Progress Notes (Signed)
Patient ID: Alyssa Knight, female   DOB: [birth year], 80 y.o.   MRN: [id number]    PCP:  Thora LanceEHINGER,ROBERT R, MD  Cardiologist:  Dr. Tobias AlexanderKatarina Annlee Glandon     History of Present Illness: Alyssa Knight is a 80 y.o. female with a hx of DCM, combined systolic and diastolic dysfunction, progressive CKD, history of TB, HTN, type 2 DM, normocytic anemia, left toe infection/dry gangrene, moderate to severe malnutrition and recurrent left pleural effusion s/p thoracenteses.  Last seen by Dr. Tobias AlexanderKatarina Deen Deguia in 09/2013.  She is being treated conservatively in regards to her DCM and CHF.    She was recently admitted 4/19-4/22 with acute on chronic systolic CHF.  L pleural effusion was noted to be increased.  She was diuresed with IV Lasix.  She did not require repeat thoracentesis.  She was d/c to [facility].  She is here with her son.  She is feeling better.  Her breathing is improved.  She sleeps on 3 pillows chronically.  No change.  She denies PND.  LE edema is improved.  She denies chest pain or syncope.    Patient reports further improvement of lower extremity edema. She still sleeps on 3 pillows and denies any orthopnea or paroxysmal nocturnal dyspnea. She is able to walk short distances with a walker. She overall feels significantly better.  Studies:  - Echo (08/28/13):  EF 15-20%, diff HK, mild MR, severe LAE, mild RVE, reduced RVSF, mod to severe RAE. (EF 30-35% in 03/2013)     CXR (02/12/14: IMPRESSION: Marked worsening left base process with pleural effusion. Consolidative LLL airspace disease not excluded.   Recent Labs: 08/27/2013: TSH 3.617  02/12/2014: ALT <5; Hemoglobin 10.8*; Pro B Natriuretic peptide (BNP) 38493.0*  02/21/2014: Creatinine 2.1*; Potassium 5.3*   Wt Readings from Last 3 Encounters:  02/21/14 126 lb (57.153 kg)  02/15/14 125 lb (56.7 kg)  11/24/13 135 lb (61.236 kg)     Past Medical History  Diagnosis Date  . Diabetes mellitus without complication   . HTN (hypertension)    . CKD (chronic kidney disease)   . Systolic CHF   . CHF (congestive heart failure)   . COPD (chronic obstructive pulmonary disease)   . Peripheral vascular disease     Current Outpatient Prescriptions  Medication Sig Dispense Refill  . acetaminophen (TYLENOL) 500 MG tablet Take 500 mg by mouth every 6 (six) hours as needed for pain (for pain in leg).      Marland Kitchen. aspirin EC 81 MG tablet Take 81 mg by mouth daily.      . furosemide (LASIX) 20 MG tablet Take 60 mg by mouth daily.      . Multiple Vitamin (MULTIVITAMIN WITH MINERALS) TABS tablet Take 1 tablet by mouth daily.      . mupirocin cream (BACTROBAN) 2 % Apply topically daily. Apply Bactroban to left great toe, right great toe, and right inner anterior foot wounds Q day.  Cover right toe/anterior foot wounds with foam dressing and change Q 5 days or PRN soiling.  15 g  0   No current facility-administered medications for this visit.    Allergies:   Fish allergy and Actos   Social History:  The patient  reports that she has never smoked. She has never used smokeless tobacco. She reports that she does not drink alcohol or use illicit drugs.   Family History:  The patient's family history includes Heart attack in her paternal grandfather and paternal grandmother; Parkinson's disease in her  maternal grandfather.   ROS:  Please see the history of present illness.      All other systems reviewed and negative.   PHYSICAL EXAM: VS:  There were no vitals taken for this visit. Well nourished, well developed, in no acute distress HEENT: normal Neck: no JVDat 90 degrees Cardiac:  normal S1, S2; RRR; no murmur Lungs:  Decreased breath sounds at L base; E->A changes 1/4 up chest on L, no rales Abd: soft, nontender, no hepatomegaly Ext: 1-2+ bilateral LE edema Skin: warm and dry Neuro:  CNs 2-12 intact, no focal abnormalities noted  EKG:  Sinus tachycardia, HR 106, PACs, Q waves in V1-V3, no significant change when compared to prior  tracings, increased artifact     ASSESSMENT AND PLAN:  1. Chronic systolic heart failure:  Volume appears further improved. She has more volume to lose. Would keep her on the same dose of Lasix for now unless she starts to have worsening renal function.  Check BMP today and [facility] for possible medication adjustments. We will also prescribe compression stockings to further improve her lower extremity edema. 2. Dilated cardiomyopathy:  Her BP cannot tolerate beta blocker.  She is not on ACEI due to CKD. 3. Pleural effusion, left:  She is stable from symptomatic point.  At this time, repeat CXR is not necessary. 4. Chronic renal insufficiency, stage III (moderate):  Repeat BMET today. 5. Atherosclerosis of native arteries of the extremities with gangrene:  She is followed by wound care at [facility]. 6. HTN (hypertension):  As noted BP now running low. 7. Disposition:  F/u Dr. Tobias Alexander or me in 6 weeks.   Lars Masson   03/08/2014 8:52 AM

## 2014-03-08 NOTE — Patient Instructions (Signed)
Your physician recommends that you continue on your current medications as directed. Please refer to the Current Medication list given to you.  Your physician recommends that you return for lab work in: TODAY (BMET)  YOUR PHYSICIAN RECOMMENDS YOU WEAR COMPRESSION STOCKINGS  Your physician recommends that you schedule a follow-up appointment in: 6 WEEKS WITH DR Delton See

## 2014-03-28 ENCOUNTER — Encounter: Payer: Self-pay | Admitting: *Deleted

## 2014-03-28 ENCOUNTER — Non-Acute Institutional Stay (SKILLED_NURSING_FACILITY): Payer: Medicare Other | Admitting: Nurse Practitioner

## 2014-03-28 DIAGNOSIS — I5022 Chronic systolic (congestive) heart failure: Secondary | ICD-10-CM

## 2014-03-28 DIAGNOSIS — E119 Type 2 diabetes mellitus without complications: Secondary | ICD-10-CM

## 2014-03-28 DIAGNOSIS — I10 Essential (primary) hypertension: Secondary | ICD-10-CM

## 2014-03-28 DIAGNOSIS — I96 Gangrene, not elsewhere classified: Secondary | ICD-10-CM

## 2014-03-28 DIAGNOSIS — M109 Gout, unspecified: Secondary | ICD-10-CM

## 2014-03-28 DIAGNOSIS — N184 Chronic kidney disease, stage 4 (severe): Secondary | ICD-10-CM

## 2014-03-28 NOTE — Progress Notes (Signed)
Patient ID: Alyssa Knight, female   DOB: November 24, 1916, 80 y.o.   MRN: [id number]    Nursing Home Location:  [facility]   Place of Service: SNF (31)  PCP: Thora Lance, MD  Allergies  Allergen Reactions  . Fish Allergy Shortness Of Breath  . Actos [Pioglitazone] Other (See Comments)    May have dropped blood sugar too much-family unsure    Chief Complaint  Patient presents with  . Medical Management of Chronic Issues    HPI:  Alyssa Knight is a 80 y.o. female pmh h/o chronic systolic heart failure, diet controlled dm, chronic gangrene of the toe, hypertension and stage 3 ckd, PVD who is at [facility] for wound care and rehab after hospitalization for acute CHF. Pt has done well in the last month, was treated for gout which has improved after prednisone course. Pt currently being treated with doxycyline due to wounds on toes. Pt without complaints at this time and staff has no concerns.   Review of Systems:  Review of Systems  Constitutional: Negative for fever, chills and malaise/fatigue.  Respiratory: Negative for cough, shortness of breath and wheezing.   Cardiovascular: Negative for chest pain and leg swelling.  Gastrointestinal: Negative for heartburn, abdominal pain, diarrhea and constipation.  Genitourinary: Negative for dysuria, urgency and frequency.  Musculoskeletal: Positive for joint pain (in fingers- however this has improved). Negative for myalgias.  Neurological: Positive for weakness. Negative for dizziness and headaches.  Psychiatric/Behavioral: Negative for depression. The patient is not nervous/anxious and does not have insomnia.      Past Medical History  Diagnosis Date  . Diabetes mellitus without complication   . HTN (hypertension)   . CKD (chronic kidney disease)   . Systolic CHF   . CHF (congestive heart failure)   . COPD (chronic obstructive pulmonary disease)   . Peripheral vascular disease    Past Surgical History  Procedure  Laterality Date  . Femur fracture surgery Left 03/2013    L distal femur lateral condyle s/p ORIF  . Orif femur fracture Left 04/11/2013    Procedure: OPEN REDUCTION INTERNAL FIXATION (ORIF) DISTAL FEMUR FRACTURE- left;  Surgeon: Senaida Lange, MD;  Location: [facility] OR;  Service: Orthopedics;  Laterality: Left;   Social History:   reports that she has never smoked. She has never used smokeless tobacco. She reports that she does not drink alcohol or use illicit drugs.  Family History  Problem Relation Age of Onset  . Parkinson's disease Maternal Grandfather   . Heart attack Paternal Grandmother   . Heart attack Paternal Grandfather     Medications: Patient's Medications  New Prescriptions   No medications on file  Previous Medications   ACETAMINOPHEN (TYLENOL) 650 MG CR TABLET    Take 650 mg by mouth every 6 (six) hours as needed for pain.   ASPIRIN EC 81 MG TABLET    Take 81 mg by mouth daily.   DOXYCYCLINE (DORYX) 100 MG EC TABLET    Take 100 mg by mouth 2 (two) times daily.   FUROSEMIDE (LASIX) 20 MG TABLET    Take 60 mg by mouth daily.   MULTIPLE VITAMIN (MULTIVITAMIN WITH MINERALS) TABS TABLET    Take 1 tablet by mouth daily.   MUPIROCIN CREAM (BACTROBAN) 2 %    Apply topically daily. Apply Bactroban to left great toe, right great toe, and right inner anterior foot wounds Q day.  Cover right toe/anterior foot wounds with foam dressing and change Q  5 days or PRN soiling.  Modified Medications   No medications on file  Discontinued Medications   No medications on file     Physical Exam:  Filed Vitals:   03/28/14 1612  BP: 99/51  Pulse: 95  Temp: 97.6 F (36.4 C)  Resp: 18  Weight: 123 lb (55.792 kg)   Physical Exam  Constitutional: She is well-developed, well-nourished, and in no distress.  HENT:  Head: Normocephalic and atraumatic.  Mouth/Throat: Oropharynx is clear and moist. No oropharyngeal exudate.  Eyes: Conjunctivae and EOM are normal. Pupils are equal, round,  and reactive to light.  Neck: Normal range of motion. Neck supple.  Cardiovascular: Normal rate, regular rhythm and normal heart sounds.   Pulmonary/Chest: Effort normal and breath sounds normal.  Abdominal: Soft. Bowel sounds are normal.  Musculoskeletal: She exhibits edema and tenderness.  Left 5th and 2nd finger PIP joint and right 4th finger PIP swollen and tender   Neurological: She is alert.  Skin: Skin is warm and dry.  Wounds to bilateral great toes, and unstageable to heel  Psychiatric: Affect normal.      Labs reviewed: Basic Metabolic Panel:  Recent Labs  04/12/13 2210  08/27/13 0250  02/15/14 0500 02/21/14 0927 03/08/14 0912  NA 135  < > 139  < > 146 139 138  K 4.7  < > 4.9  < > 4.7 5.3* 4.4  CL 101  < > 104  < > 103 98 96  CO2 28  < > 25  < > 29 31 35*  GLUCOSE 156*  < > 114*  < > 105* 159* 135*  BUN 33*  < > 39*  < > 57* 58* 55*  CREATININE 2.02*  < > 1.57*  < > 1.94* 2.1* 1.8*  CALCIUM 8.2*  < > 8.6  < > 9.4 8.8 8.6  MG 2.1  --  2.2  --   --   --   --   PHOS 5.4*  --  5.1*  --   --   --   --   < > = values in this interval not displayed. Liver Function Tests:  Recent Labs  08/26/13 2230 08/27/13 1520 02/12/14 1307  AST $Re'12 20 15  'WIQ$ ALT <5 <5 <5  ALKPHOS 71 53 66  BILITOT 0.4 0.4 0.4  PROT 7.9 7.2 8.1  ALBUMIN 3.5 3.0* 3.0*   No results found for this basename: LIPASE, AMYLASE,  in the last 8760 hours No results found for this basename: AMMONIA,  in the last 8760 hours CBC:  Recent Labs  06/25/13 0910  08/26/13 2230 08/27/13 0250 02/12/14 1307  WBC 6.4  < > 9.2 9.4 8.6  NEUTROABS 4.8  --  7.6  --  7.0  HGB 10.5*  < > 11.3* 10.6* 10.8*  HCT 32.7*  < > 35.6* 33.0* 34.7*  MCV 86.7  < > 89.9 89.9 90.4  PLT 172  < > 187 158 257  < > = values in this interval not displayed. Cardiac Enzymes:  Recent Labs  08/27/13 0812 08/27/13 1437 02/12/14 1307  TROPONINI <0.30 <0.30 <0.30   BNP: No components found with this basename: POCBNP,   CBG:  Recent Labs  02/14/14 2202 02/15/14 0627 02/15/14 1146  GLUCAP 137* 83 166*   TSH:  Recent Labs  04/15/13 0730 06/25/13 1330 08/27/13 0250  TSH 2.208 2.509 3.617   A1C: Lab Results  Component Value Date   HGBA1C 7.0* 02/13/2014   Lipid Panel:  Recent Labs  06/25/13 1126  CHOL 257*    CBC with Diff  Basic Metabolic Panel    Result: 03/01/2014 4:00 PM   ( Status: F )     C Sodium 135     135-145 mEq/L SLN   Potassium 4.5     3.5-5.3 mEq/L SLN   Chloride 98     96-112 mEq/L SLN   CO2 29     19-32 mEq/L SLN   Glucose 153   H 70-99 mg/dL SLN   BUN 77   H 6-23 mg/dL SLN   Creatinine 2.17   H 0.50-1.10 mg/dL SLN   Calcium 8.4       Result: 03/23/2014 2:58 PM   ( Status: F )       WBC 7.1     4.0-10.5 K/uL SLN   RBC 3.65   L 3.87-5.11 MIL/uL SLN   Hemoglobin 10.0   L 12.0-15.0 g/dL SLN   Hematocrit 31.2   L 36.0-46.0 % SLN   MCV 85.5     78.0-100.0 fL SLN   MCH 27.4     26.0-34.0 pg SLN   MCHC 32.1     30.0-36.0 g/dL SLN   RDW 17.0   H 11.5-15.5 % SLN   Platelet Count 188     150-400 K/uL SLN   Granulocyte % 79   H 43-77 % SLN   Absolute Gran 5.6     1.7-7.7 K/uL SLN   Lymph % 13     12-46 % SLN   Absolute Lymph 0.9     0.7-4.0 K/uL SLN   Mono % 6     3-12 % SLN   Absolute Mono 0.4     0.1-1.0 K/uL SLN   Eos % 2     0-5 % SLN   Absolute Eos 0.1     0.0-0.7 K/uL SLN   Baso % 0     0-1 % SLN   Absolute Baso 0.0     0.0-0.1 K/uL SLN   Smear Review Criteria for review not met  SLN   Sed Rate (ESR)    Result: 03/23/2014 4:16 PM   ( Status: F )       Sed Rate (ESR) 51   H 0-22 mm/hr SLN   Hemoglobin A1C    Result: 03/24/2014 6:06 AM   ( Status: F )       Hemoglobin A1C 7.4   H <5.7 % SLN C Estimated Average Glucose 166   H <117 mg/dL SLN   Prealbumin    Result: 03/23/2014 2:53 PM   ( Status: F )       Prealbumin 18.5  Assessment/Plan 1. Chronic systolic heart failure Without signs of worsening heart failure, conts on lasix   2. HTN  (hypertension) Patient is stable; continue current regimen. Will monitor and make changes as necessary.  3. Diabetes type 2, controlled -not on medication, diet controlled  4. CKD (chronic kidney disease) stage 4, GFR 15-29 ml/min -stable at this time  5. Gout Improved with prednisone treatment  6. Gangrene of toe -ongoing management with wound care, currently on doxycyline for treatment    Labs/tests ordered

## 2014-03-29 NOTE — Progress Notes (Signed)
This encounter was created in error - please disregard.

## 2014-04-10 ENCOUNTER — Ambulatory Visit: Payer: Medicare Other | Admitting: Cardiology

## 2014-04-28 ENCOUNTER — Non-Acute Institutional Stay (SKILLED_NURSING_FACILITY): Payer: Medicare Other | Admitting: Nurse Practitioner

## 2014-04-28 DIAGNOSIS — I10 Essential (primary) hypertension: Secondary | ICD-10-CM

## 2014-04-28 DIAGNOSIS — I96 Gangrene, not elsewhere classified: Secondary | ICD-10-CM

## 2014-04-28 DIAGNOSIS — I5022 Chronic systolic (congestive) heart failure: Secondary | ICD-10-CM

## 2014-04-28 DIAGNOSIS — N184 Chronic kidney disease, stage 4 (severe): Secondary | ICD-10-CM

## 2014-04-28 DIAGNOSIS — E119 Type 2 diabetes mellitus without complications: Secondary | ICD-10-CM

## 2014-04-28 NOTE — Progress Notes (Signed)
Patient ID: Alyssa Knight, female   DOB: January 06, 1917, 80 y.o.   MRN: [id number]    Nursing Home Location:  [place] of Service: SN[facility] (31)  PCP: Simona Huh, MD  Allergies  Allergen Reactions  . Fish Allergy Shortness Of Breath  . Actos [Pioglitazone] Other (See Comments)    May have dropped blood sugar too much-family unsure    Chief Complaint  Patient presents with  . Medical Management of Chronic Issues    HPI:  Alyssa Knight is a 80 y.o. female pmh h/o chronic systolic heart failure, diet controlled dm, chronic gangrene of the toe, hypertension and stage 3 ckd, PVD who is being seen today for routine follow up. There has been no major issues in the last month, pt conts with chronic gangrene followed by treatment nurse and would care. Pt without complaints at this time and staff has no concerns.   Review of Systems:  Review of Systems  Constitutional: Negative for fever, chills and malaise/fatigue.  Respiratory: Negative for cough, shortness of breath and wheezing.   Cardiovascular: Negative for chest pain and leg swelling.  Gastrointestinal: Negative for heartburn, abdominal pain, diarrhea and constipation.  Genitourinary: Negative for dysuria, urgency and frequency.  Musculoskeletal: Negative for joint pain and myalgias.  Neurological: Negative for dizziness and headaches.  Psychiatric/Behavioral: Negative for depression. The patient is not nervous/anxious and does not have insomnia.      Past Medical History  Diagnosis Date  . Diabetes mellitus without complication   . HTN (hypertension)   . CKD (chronic kidney disease)   . Systolic CHF   . CHF (congestive heart failure)   . COPD (chronic obstructive pulmonary disease)   . Peripheral vascular disease    Past Surgical History  Procedure Laterality Date  . Femur fracture surgery Left 03/2013    L distal femur lateral condyle s/p ORIF  . Orif femur fracture Left 04/11/2013   Procedure: OPEN REDUCTION INTERNAL FIXATION (ORIF) DISTAL FEMUR FRACTURE- left;  Surgeon: Marin Shutter, MD;  Location: [facility];  Service: Orthopedics;  Laterality: Left;   Social History:   reports that she has never smoked. She has never used smokeless tobacco. She reports that she does not drink alcohol or use illicit drugs.  Family History  Problem Relation Age of Onset  . Parkinson's disease Maternal Grandfather   . Heart attack Paternal Grandmother   . Heart attack Paternal Grandfather     Medications: Patient's Medications  New Prescriptions   No medications on file  Previous Medications   ACETAMINOPHEN (TYLENOL) 650 MG CR TABLET    Take 650 mg by mouth every 6 (six) hours as needed for pain.   ASPIRIN EC 81 MG TABLET    Take 81 mg by mouth daily.   FUROSEMIDE (LASIX) 20 MG TABLET    Take 60 mg by mouth daily.   MULTIPLE VITAMIN (MULTIVITAMIN WITH MINERALS) TABS TABLET    Take 1 tablet by mouth daily.   MUPIROCIN CREAM (BACTROBAN) 2 %    Apply topically daily. Apply Bactroban to left great toe, right great toe, and right inner anterior foot wounds Q day.  Cover right toe/anterior foot wounds with foam dressing and change Q 5 days or PRN soiling.  Modified Medications   No medications on file  Discontinued Medications   No medications on file     Physical Exam:  Filed Vitals:   04/28/14 1346  BP: 100/60  Pulse: 90  Temp:  97.9 F (36.6 C)  Resp: 18  Weight: 126 lb (57.153 kg)   Physical Exam  Constitutional: She is well-developed, well-nourished, and in no distress.  HENT:  Head: Normocephalic and atraumatic.  Mouth/Throat: Oropharynx is clear and moist. No oropharyngeal exudate.  Eyes: Conjunctivae and EOM are normal. Pupils are equal, round, and reactive to light.  Neck: Normal range of motion. Neck supple.  Cardiovascular: Normal rate, regular rhythm and normal heart sounds.   Pulmonary/Chest: Effort normal and breath sounds normal.  Abdominal: Soft. Bowel  sounds are normal.  Musculoskeletal: She exhibits edema (+1).     Neurological: She is alert.  Skin: Skin is warm and dry.  Wounds to bilateral great toes, and unstageable to heel  Psychiatric: Affect normal.      Labs reviewed: Basic Metabolic Panel:  Recent Labs  08/27/13 0250  02/15/14 0500 02/21/14 0927 03/08/14 0912  NA 139  < > 146 139 138  K 4.9  < > 4.7 5.3* 4.4  CL 104  < > 103 98 96  CO2 25  < > 29 31 35*  GLUCOSE 114*  < > 105* 159* 135*  BUN 39*  < > 57* 58* 55*  CREATININE 1.57*  < > 1.94* 2.1* 1.8*  CALCIUM 8.6  < > 9.4 8.8 8.6  MG 2.2  --   --   --   --   PHOS 5.1*  --   --   --   --   < > = values in this interval not displayed. Liver Function Tests:  Recent Labs  08/26/13 2230 08/27/13 1520 02/12/14 1307  AST _0 ALT <5 <5 <5  ALKPHOS 71 53 66  BILITOT 0.4 0.4 0.4  PROT 7.9 7.2 8.1  ALBUMIN 3.5 3.0* 3.0*   No results found for this basename: LIPASE, AMYLASE,  in the last 8760 hours No results found for this basename: AMMONIA,  in the last 8760 hours CBC:  Recent Labs  06/25/13 0910  08/26/13 2230 08/27/13 0250 02/12/14 1307  WBC 6.4  < > 9.2 9.4 8.6  NEUTROABS 4.8  --  7.6  --  7.0  HGB 10.5*  < > 11.3* 10.6* 10.8*  HCT 32.7*  < > 35.6* 33.0* 34.7*  MCV 86.7  < > 89.9 89.9 90.4  PLT 172  < > 187 158 257  < > = values in this interval not displayed. Cardiac Enzymes:  Recent Labs  08/27/13 0812 08/27/13 1437 02/12/14 1307  TROPONINI <0.30 <0.30 <0.30   BNP: No components found with this basename: POCBNP,  CBG:  Recent Labs  02/14/14 2202 02/15/14 0627 02/15/14 1146  GLUCAP 137* 83 166*   TSH:  Recent Labs  06/25/13 1330 08/27/13 0250  TSH 2.509 3.617   A1C: Lab Results  Component Value Date   HGBA1C 7.0* 02/13/2014   Lipid Panel:  Recent Labs  06/25/13 1126  CHOL 257*    Basic Metabolic Panel  Result: 3/0/1601 4:00 PM ( Status: F ) C  Sodium 135 135-145 mEq/L SLN  Potassium 4.5 3.5-5.3 mEq/L  SLN  Chloride 98 96-112 mEq/L SLN  CO2 29 19-32 mEq/L SLN  Glucose 153 H 70-99 mg/dL SLN  BUN 77 H 6-23 mg/dL SLN  Creatinine 2.17 H 0.50-1.10 mg/dL SLN  Calcium 8.4  Result: 03/23/2014 2:58 PM ( Status: F )  WBC 7.1 4.0-10.5 K/uL SLN  RBC 3.65 L 3.87-5.11 MIL/uL SLN  Hemoglobin 10.0 L 12.0-15.0 g/dL SLN  Hematocrit 31.2 L  36.0-46.0 % SLN  MCV 85.5 78.0-100.0 fL SLN  MCH 27.4 26.0-34.0 pg SLN  MCHC 32.1 30.0-36.0 g/dL SLN  RDW 17.0 H 11.5-15.5 % SLN  Platelet Count 188 150-400 K/uL SLN  Granulocyte % 79 H 43-77 % SLN  Absolute Gran 5.6 1.7-7.7 K/uL SLN  Lymph % 13 12-46 % SLN  Absolute Lymph 0.9 0.7-4.0 K/uL SLN  Mono % 6 3-12 % SLN  Absolute Mono 0.4 0.1-1.0 K/uL SLN  Eos % 2 0-5 % SLN  Absolute Eos 0.1 0.0-0.7 K/uL SLN  Baso % 0 0-1 % SLN  Absolute Baso 0.0 0.0-0.1 K/uL SLN  Smear Review Criteria for review not met SLN  Sed Rate (ESR)  Result: 03/23/2014 4:16 PM ( Status: F )  Sed Rate (ESR) 51 H 0-22 mm/hr SLN  Hemoglobin A1C  Result: 03/24/2014 6:06 AM ( Status: F )  Hemoglobin A1C 7.4 H <5.7 % SLN C  Estimated Average Glucose 166 H <117 mg/dL SLN  Prealbumin  Result: 03/23/2014 2:53 PM ( Status: F )  Prealbumin 18.5    Assessment/Plan 1. Chronic systolic heart failure -has been stable, weight gain noted but without signs of fluid overload, pt with increased appetite  -conts on lasix and ASA   2. Essential hypertension -conts on lasix   3. Diabetes type 2, controlled -not on medications, A1c of 7.4  4. CKD (chronic kidney disease) stage 4, GFR 15-29 ml/min Has remained stable   5. Gangrene of toe -remains stable, conts to be followed by wound care MD and treatment nurse

## 2014-05-04 ENCOUNTER — Encounter: Payer: Self-pay | Admitting: Cardiology

## 2014-05-08 ENCOUNTER — Encounter (HOSPITAL_COMMUNITY): Payer: Self-pay | Admitting: Radiology

## 2014-05-08 ENCOUNTER — Emergency Department (HOSPITAL_COMMUNITY)
Admission: EM | Admit: 2014-05-08 | Discharge: 2014-05-09 | Disposition: A | Discharge status: Skilled Nursing Facility | Payer: Medicare Other | Attending: Emergency Medicine | Admitting: Emergency Medicine

## 2014-05-08 ENCOUNTER — Emergency Department (HOSPITAL_COMMUNITY): Payer: Medicare Other

## 2014-05-08 DIAGNOSIS — I96 Gangrene, not elsewhere classified: Secondary | ICD-10-CM | POA: Insufficient documentation

## 2014-05-08 DIAGNOSIS — Z7982 Long term (current) use of aspirin: Secondary | ICD-10-CM | POA: Insufficient documentation

## 2014-05-08 DIAGNOSIS — Z888 Allergy status to other drugs, medicaments and biological substances status: Secondary | ICD-10-CM | POA: Diagnosis not present

## 2014-05-08 DIAGNOSIS — I739 Peripheral vascular disease, unspecified: Secondary | ICD-10-CM | POA: Diagnosis not present

## 2014-05-08 DIAGNOSIS — N189 Chronic kidney disease, unspecified: Secondary | ICD-10-CM | POA: Insufficient documentation

## 2014-05-08 DIAGNOSIS — Y9210 Unspecified residential institution as the place of occurrence of the external cause: Secondary | ICD-10-CM | POA: Diagnosis not present

## 2014-05-08 DIAGNOSIS — J449 Chronic obstructive pulmonary disease, unspecified: Secondary | ICD-10-CM | POA: Diagnosis not present

## 2014-05-08 DIAGNOSIS — R609 Edema, unspecified: Secondary | ICD-10-CM | POA: Diagnosis not present

## 2014-05-08 DIAGNOSIS — Z79899 Other long term (current) drug therapy: Secondary | ICD-10-CM | POA: Diagnosis not present

## 2014-05-08 DIAGNOSIS — M6281 Muscle weakness (generalized): Secondary | ICD-10-CM | POA: Insufficient documentation

## 2014-05-08 DIAGNOSIS — Y939 Activity, unspecified: Secondary | ICD-10-CM | POA: Diagnosis not present

## 2014-05-08 DIAGNOSIS — I5020 Unspecified systolic (congestive) heart failure: Secondary | ICD-10-CM | POA: Insufficient documentation

## 2014-05-08 DIAGNOSIS — L089 Local infection of the skin and subcutaneous tissue, unspecified: Secondary | ICD-10-CM

## 2014-05-08 DIAGNOSIS — Z792 Long term (current) use of antibiotics: Secondary | ICD-10-CM | POA: Insufficient documentation

## 2014-05-08 DIAGNOSIS — X58XXXA Exposure to other specified factors, initial encounter: Secondary | ICD-10-CM | POA: Insufficient documentation

## 2014-05-08 DIAGNOSIS — G319 Degenerative disease of nervous system, unspecified: Secondary | ICD-10-CM | POA: Insufficient documentation

## 2014-05-08 DIAGNOSIS — J4489 Other specified chronic obstructive pulmonary disease: Secondary | ICD-10-CM | POA: Insufficient documentation

## 2014-05-08 DIAGNOSIS — S91109A Unspecified open wound of unspecified toe(s) without damage to nail, initial encounter: Secondary | ICD-10-CM | POA: Diagnosis not present

## 2014-05-08 DIAGNOSIS — I129 Hypertensive chronic kidney disease with stage 1 through stage 4 chronic kidney disease, or unspecified chronic kidney disease: Secondary | ICD-10-CM | POA: Diagnosis not present

## 2014-05-08 DIAGNOSIS — E1159 Type 2 diabetes mellitus with other circulatory complications: Secondary | ICD-10-CM | POA: Diagnosis not present

## 2014-05-08 LAB — BASIC METABOLIC PANEL
ANION GAP: 16 — AB (ref 5–15)
BUN: 90 mg/dL — ABNORMAL HIGH (ref 6–23)
CALCIUM: 8.8 mg/dL (ref 8.4–10.5)
CO2: 26 mEq/L (ref 19–32)
Chloride: 97 mEq/L (ref 96–112)
Creatinine, Ser: 2.03 mg/dL — ABNORMAL HIGH (ref 0.50–1.10)
GFR calc Af Amer: 23 mL/min — ABNORMAL LOW (ref >90)
GFR calc non Af Amer: 20 mL/min — ABNORMAL LOW (ref >90)
GLUCOSE: 188 mg/dL — AB (ref 70–99)
Potassium: 3.6 mEq/L — ABNORMAL LOW (ref 3.7–5.3)
SODIUM: 139 meq/L (ref 137–147)

## 2014-05-08 LAB — CBC
HCT: 30.4 % — ABNORMAL LOW (ref 36.0–46.0)
HEMOGLOBIN: 9.4 g/dL — AB (ref 12.0–15.0)
MCH: 27.8 pg (ref 26.0–34.0)
MCHC: 30.9 g/dL (ref 30.0–36.0)
MCV: 89.9 fL (ref 78.0–100.0)
PLATELETS: 214 10*3/uL (ref 150–400)
RBC: 3.38 MIL/uL — AB (ref 3.87–5.11)
RDW: 17.9 % — ABNORMAL HIGH (ref 11.5–15.5)
WBC: 8.6 10*3/uL (ref 4.0–10.5)

## 2014-05-08 MED ORDER — VANCOMYCIN HCL IN DEXTROSE 1-5 GM/200ML-% IV SOLN
1000.0000 mg | Freq: Once | INTRAVENOUS | Status: AC
  Administered 2014-05-08: 1000 mg via INTRAVENOUS
  Filled 2014-05-08: qty 200

## 2014-05-08 MED ORDER — SODIUM CHLORIDE 0.9 % IV SOLN
Freq: Once | INTRAVENOUS | Status: AC
  Administered 2014-05-08: via INTRAVENOUS

## 2014-05-08 NOTE — ED Provider Notes (Signed)
CSN: [id number]     Arrival date & time 05/08/14  2141 History   First MD Initiated Contact with Patient 05/08/14 2259     Chief Complaint  Patient presents with  . Wound Infection  . Weakness     (Consider location/radiation/quality/duration/timing/severity/associated sxs/prior Treatment) HPI Comments: 80 year old female with a history of diabetes, hypertension and chronic kidney disease who has been admitted several times for congestive heart failure in the past couple of years. She presents because of an infection of her left great toe. She has struggled with some gangrene in this toe over the last year, she has been informed by the vascular surgeons that this may need to be amputated but at this point the decision was left up to the patient and she has declined thus far. The family reports that over the last couple of days she has had mild generalized weakness and seems to be a little slower to answer questions but is able to answer questions appropriately. She has done less walking but denies fevers chills nausea vomiting diarrhea abdominal pain chest pain coughing or shortness of breath. They report that her left great toe has had gradual swelling, discharge and appears infected. This is a new change over the last several days  Patient is a 80 y.o. female presenting with weakness. The history is provided by the patient and a relative.  Weakness    Past Medical History  Diagnosis Date  . Diabetes mellitus without complication   . HTN (hypertension)   . CKD (chronic kidney disease)   . Systolic CHF   . CHF (congestive heart failure)   . COPD (chronic obstructive pulmonary disease)   . Peripheral vascular disease    Past Surgical History  Procedure Laterality Date  . Femur fracture surgery Left 03/2013    L distal femur lateral condyle s/p ORIF  . Orif femur fracture Left 04/11/2013    Procedure: OPEN REDUCTION INTERNAL FIXATION (ORIF) DISTAL FEMUR FRACTURE- left;  Surgeon: Senaida Lange, MD;  Location: [facility] OR;  Service: Orthopedics;  Laterality: Left;   Family History  Problem Relation Age of Onset  . Parkinson's disease Maternal Grandfather   . Heart attack Paternal Grandmother   . Heart attack Paternal Grandfather    History  Substance Use Topics  . Smoking status: Never Smoker   . Smokeless tobacco: Never Used  . Alcohol Use: No   OB History   Grav Para Term Preterm Abortions TAB SAB Ect Mult Living                 Review of Systems  Neurological: Positive for weakness.  All other systems reviewed and are negative.     Allergies  Fish allergy and Actos  Home Medications   Prior to Admission medications   Medication Sig Start Date End Date Taking? Authorizing Provider  acetaminophen (TYLENOL) 650 MG CR tablet Take 650 mg by mouth every 6 (six) hours as needed for pain.   Yes Historical Provider, MD  Amino Acids-Protein Hydrolys (FEEDING SUPPLEMENT, PRO-STAT SUGAR FREE 64,) LIQD Take 30 mLs by mouth every morning.   Yes Historical Provider, MD  aspirin EC 81 MG tablet Take 81 mg by mouth daily.   Yes Historical Provider, MD  furosemide (LASIX) 20 MG tablet Take 60 mg by mouth daily.   Yes Historical Provider, MD  Multiple Vitamin (MULTIVITAMIN WITH MINERALS) TABS tablet Take 1 tablet by mouth daily.   Yes Historical Provider, MD  mupirocin cream (BACTROBAN) 2 %  Apply topically daily. Apply Bactroban to left great toe, right great toe, and right inner anterior foot wounds Q day.  Cover right toe/anterior foot wounds with foam dressing and change Q 5 days or PRN soiling. 02/15/14  Yes Renae Fickle, MD  cephALEXin (KEFLEX) 500 MG capsule Take 1 capsule (500 mg total) by mouth 4 (four) times daily. 05/09/14   Vida Roller, MD  sulfamethoxazole-trimethoprim (SEPTRA DS) 800-160 MG per tablet Take 1 tablet by mouth every 12 (twelve) hours. 05/09/14   Vida Roller, MD   BP 108/81  Pulse 105  Temp(Src) 98.4 F (36.9 C) (Rectal)  Resp 23  SpO2  98% Physical Exam  Nursing note and vitals reviewed. Constitutional: She appears well-developed and well-nourished. No distress.  HENT:  Head: Normocephalic and atraumatic.  Mouth/Throat: Oropharynx is clear and moist. No oropharyngeal exudate.  Eyes: Conjunctivae and EOM are normal. Pupils are equal, round, and reactive to light. Right eye exhibits no discharge. Left eye exhibits no discharge. No scleral icterus.  Slight pale conjunctiva  Neck: Normal range of motion. Neck supple. No JVD present. No thyromegaly present.  Cardiovascular: Normal rate, normal heart sounds and intact distal pulses.  Exam reveals no gallop and no friction rub.   No murmur heard. Slightly irregular rhythm  Pulmonary/Chest: Effort normal and breath sounds normal. No respiratory distress. She has no wheezes. She has no rales.  Abdominal: Soft. Bowel sounds are normal. She exhibits no distension and no mass. There is no tenderness.  Musculoskeletal: Normal range of motion. She exhibits no edema and no tenderness.  The left great toe has open wounds distally, foul-smelling, no purulent discharge. Bilateral lower extremities with 2+ pitting edema below the knees, slight discoloration of the skin bilaterally at the ankles but no asymmetry.  Lymphadenopathy:    She has no cervical adenopathy.  Neurological: She is alert. Coordination normal.  Skin: Skin is warm and dry.  Psychiatric: She has a normal mood and affect. Her behavior is normal.    ED Course  Procedures (including critical care time) Labs Review Labs Reviewed  CBC - Abnormal; Notable for the following:    RBC 3.38 (*)    Hemoglobin 9.4 (*)    HCT 30.4 (*)    RDW 17.9 (*)    All other components within normal limits  BASIC METABOLIC PANEL - Abnormal; Notable for the following:    Potassium 3.6 (*)    Glucose, Bld 188 (*)    BUN 90 (*)    Creatinine, Ser 2.03 (*)    GFR calc non Af Amer 20 (*)    GFR calc Af Amer 23 (*)    Anion gap 16 (*)     All other components within normal limits  URINALYSIS, ROUTINE W REFLEX MICROSCOPIC - Abnormal; Notable for the following:    APPearance CLOUDY (*)    Hgb urine dipstick SMALL (*)    Leukocytes, UA LARGE (*)    All other components within normal limits  URINE MICROSCOPIC-ADD ON - Abnormal; Notable for the following:    Squamous Epithelial / LPF FEW (*)    Bacteria, UA FEW (*)    Casts HYALINE CASTS (*)    All other components within normal limits  CBG MONITORING, ED - Abnormal; Notable for the following:    Glucose-Capillary 161 (*)    All other components within normal limits  SEDIMENTATION RATE  C-REACTIVE PROTEIN    Imaging Review Ct Head Wo Contrast  05/08/2014   CLINICAL DATA:  General weakness.  EXAM: CT HEAD WITHOUT CONTRAST  TECHNIQUE: Contiguous axial images were obtained from the base of the skull through the vertex without intravenous contrast.  COMPARISON:  None.  FINDINGS: Diffuse prominence of the CSF containing spaces is compatible with generalized cerebral atrophy. Scattered and confluent hypodensity within the periventricular and deep white matter both cerebral hemispheres is most consistent with moderate small vessel ischemic changes. Prominent vascular calcifications present within the carotid siphons bilaterally.  There is no acute intracranial hemorrhage or infarct. No mass lesion or midline shift. Gray-white matter differentiation is well maintained. Ventricles are normal in size without evidence of hydrocephalus. CSF containing spaces are within normal limits. No extra-axial fluid collection.  The calvarium is intact.  Orbital soft tissues are within normal limits.  The paranasal sinuses and mastoid air cells are well pneumatized and free of fluid.  Scalp soft tissues are unremarkable.  IMPRESSION: 1. No acute intracranial abnormality. 2. Generalized cerebral atrophy with moderate chronic microvascular ischemic disease.   Electronically Signed   By: Rise MuBenjamin  McClintock  M.D.   On: 05/08/2014 23:06   Dg Toe Great Left  05/09/2014   CLINICAL DATA:  Wound infection.  Weakness.  EXAM: LEFT GREAT TOE  COMPARISON:  11/18/2013  FINDINGS: Large ulcer involving the tip of the great toe. The tuft of the great toe appears exposed to air. No fragmentation or erosion. No joint narrowing, fracture, or dislocation. There are soft tissue and vascular calcifications of the great toe.  IMPRESSION: Large soft tissue ulcer of the great toe, with possible distal phalanx exposure. No changes of osteomyelitis.   Electronically Signed   By: Tiburcio PeaJonathan  Watts M.D.   On: 05/09/2014 01:11     EKG Interpretation   Date/Time:  Monday May 08 2014 21:53:06 EDT Ventricular Rate:  114 PR Interval:  182 QRS Duration: 87 QT Interval:  333 QTC Calculation: 459 R Axis:   -66 Text Interpretation:  Sinus tachycardia with irregular rate Inferior  infarct, old Probable anterolateral infarct, age indeterm Baseline wander  in lead(s) V2 since last tracing no significant change Abnormal ekg  Confirmed by Burk Hoctor  MD, Tara Wich ([id number]) on 05/08/2014 11:12:18 PM      MDM   Final diagnoses:  Infection of toe    Laboratory work suggests a normal white blood cell count, she is anemic but this appears close to her baseline between 9.5 and 10.5. Labs ordered prior to my arrival has not yet resulted including a sedimentation rate and C-reactive protein however the patient's toe does appear infected and likely needs antibiotics and further consideration for amputation if this does not improve the patient's symptoms. She does have an elevated BUN consistent with a dehydrated state which would also account for the patient's general decline. She answers all my worsens appropriately moves all extremities x4 and has a normal mental status alert and oriented. There is no abdominal pain, clear lung sounds and she does not appear to be in distress.  Antibiotics, x-ray to evaluate for osteomyelitis  The patient is  stable appearing, no signs of osteomyelitis on x-ray, no leukocytosis, no fevers or chills, no tachycardia or hypotension, antibiotics given his stable for discharge home and discussed care with the family, they will call vascular surgery in the morning, the patient is arty seen Dr. Darrick Pennafields in the past   Meds given in ED:  Medications  vancomycin (VANCOCIN) IVPB 1000 mg/200 mL premix (0 mg Intravenous Stopped 05/09/14 0051)  0.9 %  sodium chloride infusion (  Intravenous New Bag/Given 05/08/14 2351)    New Prescriptions   CEPHALEXIN (KEFLEX) 500 MG CAPSULE    Take 1 capsule (500 mg total) by mouth 4 (four) times daily.   SULFAMETHOXAZOLE-TRIMETHOPRIM (SEPTRA DS) 800-160 MG PER TABLET    Take 1 tablet by mouth every 12 (twelve) hours.      Vida Roller, MD 05/09/14 [phone]

## 2014-05-08 NOTE — ED Notes (Signed)
Per EMS, patient is from facility, [facility] with complaint of infection to right big toe.  Treatment has been for ganegreen, tonight the nurse noticed "wet gangreen and notices smell".  No fever or chills.  Weakness in the past few days, patient can normally ambulate.  VS enroute: BP 130/80, P 81, 96% on room air, 18 resp.  Infection is in left big toe.

## 2014-05-09 ENCOUNTER — Encounter (HOSPITAL_COMMUNITY): Payer: Self-pay | Admitting: Emergency Medicine

## 2014-05-09 ENCOUNTER — Emergency Department (HOSPITAL_COMMUNITY): Payer: Medicare Other

## 2014-05-09 DIAGNOSIS — S91109A Unspecified open wound of unspecified toe(s) without damage to nail, initial encounter: Secondary | ICD-10-CM | POA: Diagnosis not present

## 2014-05-09 LAB — URINALYSIS, ROUTINE W REFLEX MICROSCOPIC
BILIRUBIN URINE: NEGATIVE
Glucose, UA: NEGATIVE mg/dL
Ketones, ur: NEGATIVE mg/dL
Nitrite: NEGATIVE
PROTEIN: NEGATIVE mg/dL
Specific Gravity, Urine: 1.015 (ref 1.005–1.030)
Urobilinogen, UA: 1 mg/dL (ref 0.0–1.0)
pH: 5.5 (ref 5.0–8.0)

## 2014-05-09 LAB — URINE MICROSCOPIC-ADD ON

## 2014-05-09 LAB — CBG MONITORING, ED: GLUCOSE-CAPILLARY: 161 mg/dL — AB (ref 70–99)

## 2014-05-09 LAB — C-REACTIVE PROTEIN: CRP: 3.3 mg/dL — ABNORMAL HIGH (ref <0.60)

## 2014-05-09 LAB — SEDIMENTATION RATE: SED RATE: 70 mm/h — AB (ref 0–22)

## 2014-05-09 MED ORDER — SULFAMETHOXAZOLE-TRIMETHOPRIM 800-160 MG PO TABS: 1.0000 | ORAL_TABLET | Freq: Two times a day (BID) | ORAL | Status: DC

## 2014-05-09 MED ORDER — CEPHALEXIN 500 MG PO CAPS: 500.0000 mg | ORAL_CAPSULE | Freq: Four times a day (QID) | ORAL | Status: DC

## 2014-05-09 NOTE — Discharge Instructions (Signed)
You MUST call Dr. Darrick Penna office in the morning - you should be seen by your family doctor in the next 2 day as well.  This toe may need to be amputated  Return for high fevers, or spreading swelling or redness.

## 2014-05-09 NOTE — ED Notes (Signed)
Wound care completed on left foot, great toe. Cleaned with sterile saline and wrapped with non-adhesive dressing.

## 2014-05-09 NOTE — ED Notes (Signed)
Patient is awaiting [facility]AR. Secretary has contacted for transport.

## 2014-05-14 ENCOUNTER — Emergency Department (HOSPITAL_COMMUNITY): Payer: Medicare Other

## 2014-05-14 ENCOUNTER — Encounter (HOSPITAL_COMMUNITY): Payer: Self-pay | Admitting: Emergency Medicine

## 2014-05-14 ENCOUNTER — Inpatient Hospital Stay (HOSPITAL_COMMUNITY)
Admission: EM | Admit: 2014-05-14 | Discharge: 2014-05-16 | DRG: 871 | Disposition: A | Discharge status: Skilled Nursing Facility | Payer: Medicare Other | Attending: Internal Medicine | Admitting: Internal Medicine

## 2014-05-14 DIAGNOSIS — L8995 Pressure ulcer of unspecified site, unstageable: Secondary | ICD-10-CM | POA: Diagnosis present

## 2014-05-14 DIAGNOSIS — I130 Hypertensive heart and chronic kidney disease with heart failure and stage 1 through stage 4 chronic kidney disease, or unspecified chronic kidney disease: Secondary | ICD-10-CM | POA: Diagnosis present

## 2014-05-14 DIAGNOSIS — I509 Heart failure, unspecified: Secondary | ICD-10-CM | POA: Diagnosis present

## 2014-05-14 DIAGNOSIS — Z7982 Long term (current) use of aspirin: Secondary | ICD-10-CM

## 2014-05-14 DIAGNOSIS — J4489 Other specified chronic obstructive pulmonary disease: Secondary | ICD-10-CM | POA: Diagnosis present

## 2014-05-14 DIAGNOSIS — Z8249 Family history of ischemic heart disease and other diseases of the circulatory system: Secondary | ICD-10-CM

## 2014-05-14 DIAGNOSIS — N179 Acute kidney failure, unspecified: Secondary | ICD-10-CM | POA: Diagnosis present

## 2014-05-14 DIAGNOSIS — E43 Unspecified severe protein-calorie malnutrition: Secondary | ICD-10-CM | POA: Diagnosis present

## 2014-05-14 DIAGNOSIS — J449 Chronic obstructive pulmonary disease, unspecified: Secondary | ICD-10-CM | POA: Diagnosis present

## 2014-05-14 DIAGNOSIS — I70269 Atherosclerosis of native arteries of extremities with gangrene, unspecified extremity: Secondary | ICD-10-CM | POA: Diagnosis present

## 2014-05-14 DIAGNOSIS — I420 Dilated cardiomyopathy: Secondary | ICD-10-CM

## 2014-05-14 DIAGNOSIS — I96 Gangrene, not elsewhere classified: Secondary | ICD-10-CM

## 2014-05-14 DIAGNOSIS — E119 Type 2 diabetes mellitus without complications: Secondary | ICD-10-CM | POA: Diagnosis present

## 2014-05-14 DIAGNOSIS — I5022 Chronic systolic (congestive) heart failure: Secondary | ICD-10-CM | POA: Diagnosis present

## 2014-05-14 DIAGNOSIS — N184 Chronic kidney disease, stage 4 (severe): Secondary | ICD-10-CM | POA: Diagnosis present

## 2014-05-14 DIAGNOSIS — Z515 Encounter for palliative care: Secondary | ICD-10-CM

## 2014-05-14 DIAGNOSIS — L89609 Pressure ulcer of unspecified heel, unspecified stage: Secondary | ICD-10-CM | POA: Diagnosis present

## 2014-05-14 DIAGNOSIS — E860 Dehydration: Secondary | ICD-10-CM | POA: Diagnosis present

## 2014-05-14 DIAGNOSIS — R6520 Severe sepsis without septic shock: Secondary | ICD-10-CM

## 2014-05-14 DIAGNOSIS — Z66 Do not resuscitate: Secondary | ICD-10-CM | POA: Diagnosis present

## 2014-05-14 DIAGNOSIS — J90 Pleural effusion, not elsewhere classified: Secondary | ICD-10-CM

## 2014-05-14 DIAGNOSIS — R7401 Elevation of levels of liver transaminase levels: Secondary | ICD-10-CM

## 2014-05-14 DIAGNOSIS — R0902 Hypoxemia: Secondary | ICD-10-CM | POA: Diagnosis present

## 2014-05-14 DIAGNOSIS — A419 Sepsis, unspecified organism: Principal | ICD-10-CM | POA: Diagnosis present

## 2014-05-14 DIAGNOSIS — Z91013 Allergy to seafood: Secondary | ICD-10-CM

## 2014-05-14 DIAGNOSIS — N390 Urinary tract infection, site not specified: Secondary | ICD-10-CM

## 2014-05-14 DIAGNOSIS — I428 Other cardiomyopathies: Secondary | ICD-10-CM

## 2014-05-14 DIAGNOSIS — I739 Peripheral vascular disease, unspecified: Secondary | ICD-10-CM | POA: Diagnosis present

## 2014-05-14 DIAGNOSIS — L89899 Pressure ulcer of other site, unspecified stage: Secondary | ICD-10-CM | POA: Diagnosis present

## 2014-05-14 DIAGNOSIS — I10 Essential (primary) hypertension: Secondary | ICD-10-CM | POA: Diagnosis present

## 2014-05-14 DIAGNOSIS — IMO0002 Reserved for concepts with insufficient information to code with codable children: Secondary | ICD-10-CM

## 2014-05-14 DIAGNOSIS — Z888 Allergy status to other drugs, medicaments and biological substances status: Secondary | ICD-10-CM | POA: Diagnosis not present

## 2014-05-14 DIAGNOSIS — R740 Nonspecific elevation of levels of transaminase and lactic acid dehydrogenase [LDH]: Secondary | ICD-10-CM

## 2014-05-14 DIAGNOSIS — N189 Chronic kidney disease, unspecified: Secondary | ICD-10-CM

## 2014-05-14 DIAGNOSIS — I959 Hypotension, unspecified: Secondary | ICD-10-CM | POA: Diagnosis present

## 2014-05-14 DIAGNOSIS — N170 Acute kidney failure with tubular necrosis: Secondary | ICD-10-CM

## 2014-05-14 LAB — URINALYSIS, ROUTINE W REFLEX MICROSCOPIC
BILIRUBIN URINE: NEGATIVE
Glucose, UA: NEGATIVE mg/dL
HGB URINE DIPSTICK: NEGATIVE
Ketones, ur: 15 mg/dL — AB
NITRITE: NEGATIVE
PROTEIN: 30 mg/dL — AB
Specific Gravity, Urine: 1.021 (ref 1.005–1.030)
Urobilinogen, UA: 1 mg/dL (ref 0.0–1.0)
pH: 5 (ref 5.0–8.0)

## 2014-05-14 LAB — COMPREHENSIVE METABOLIC PANEL
ALK PHOS: 233 U/L — AB (ref 39–117)
ALT: 215 U/L — ABNORMAL HIGH (ref 0–35)
AST: 618 U/L — ABNORMAL HIGH (ref 0–37)
Albumin: 3.1 g/dL — ABNORMAL LOW (ref 3.5–5.2)
Anion gap: 23 — ABNORMAL HIGH (ref 5–15)
BUN: 122 mg/dL — AB (ref 6–23)
CO2: 22 mEq/L (ref 19–32)
Calcium: 8.7 mg/dL (ref 8.4–10.5)
Chloride: 94 mEq/L — ABNORMAL LOW (ref 96–112)
Creatinine, Ser: 3.81 mg/dL — ABNORMAL HIGH (ref 0.50–1.10)
GFR calc non Af Amer: 9 mL/min — ABNORMAL LOW (ref >90)
GFR, EST AFRICAN AMERICAN: 11 mL/min — AB (ref >90)
GLUCOSE: 61 mg/dL — AB (ref 70–99)
POTASSIUM: 4.4 meq/L (ref 3.7–5.3)
Sodium: 139 mEq/L (ref 137–147)
Total Bilirubin: 0.6 mg/dL (ref 0.3–1.2)
Total Protein: 7.6 g/dL (ref 6.0–8.3)

## 2014-05-14 LAB — URINE MICROSCOPIC-ADD ON

## 2014-05-14 LAB — CBC WITH DIFFERENTIAL/PLATELET
BASOS ABS: 0 10*3/uL (ref 0.0–0.1)
BASOS PCT: 0 % (ref 0–1)
Eosinophils Absolute: 0.1 10*3/uL (ref 0.0–0.7)
Eosinophils Relative: 0 % (ref 0–5)
HEMATOCRIT: 27.8 % — AB (ref 36.0–46.0)
HEMOGLOBIN: 9 g/dL — AB (ref 12.0–15.0)
Lymphocytes Relative: 5 % — ABNORMAL LOW (ref 12–46)
Lymphs Abs: 0.6 10*3/uL — ABNORMAL LOW (ref 0.7–4.0)
MCH: 28.3 pg (ref 26.0–34.0)
MCHC: 32.4 g/dL (ref 30.0–36.0)
MCV: 87.4 fL (ref 78.0–100.0)
MONOS PCT: 5 % (ref 3–12)
Monocytes Absolute: 0.6 10*3/uL (ref 0.1–1.0)
Neutro Abs: 11 10*3/uL — ABNORMAL HIGH (ref 1.7–7.7)
Neutrophils Relative %: 90 % — ABNORMAL HIGH (ref 43–77)
Platelets: 190 10*3/uL (ref 150–400)
RBC: 3.18 MIL/uL — ABNORMAL LOW (ref 3.87–5.11)
RDW: 18 % — ABNORMAL HIGH (ref 11.5–15.5)
WBC: 12.3 10*3/uL — ABNORMAL HIGH (ref 4.0–10.5)

## 2014-05-14 LAB — I-STAT CHEM 8, ED
BUN: 125 mg/dL — ABNORMAL HIGH (ref 6–23)
CHLORIDE: 99 meq/L (ref 96–112)
Calcium, Ion: 1.04 mmol/L — ABNORMAL LOW (ref 1.13–1.30)
Creatinine, Ser: 4.6 mg/dL — ABNORMAL HIGH (ref 0.50–1.10)
GLUCOSE: 67 mg/dL — AB (ref 70–99)
HEMATOCRIT: 32 % — AB (ref 36.0–46.0)
HEMOGLOBIN: 10.9 g/dL — AB (ref 12.0–15.0)
Potassium: 4.2 mEq/L (ref 3.7–5.3)
Sodium: 135 mEq/L — ABNORMAL LOW (ref 137–147)
TCO2: 22 mmol/L (ref 0–100)

## 2014-05-14 LAB — MRSA PCR SCREENING: MRSA BY PCR: NEGATIVE

## 2014-05-14 LAB — I-STAT CG4 LACTIC ACID, ED: LACTIC ACID, VENOUS: 1.69 mmol/L (ref 0.5–2.2)

## 2014-05-14 LAB — CBG MONITORING, ED: GLUCOSE-CAPILLARY: 99 mg/dL (ref 70–99)

## 2014-05-14 MED ORDER — ASPIRIN EC 81 MG PO TBEC
81.0000 mg | DELAYED_RELEASE_TABLET | Freq: Every day | ORAL | Status: DC
  Administered 2014-05-14 – 2014-05-16 (×3): 81 mg via ORAL
  Filled 2014-05-14 (×4): qty 1

## 2014-05-14 MED ORDER — HEPARIN SODIUM (PORCINE) 5000 UNIT/ML IJ SOLN
5000.0000 [IU] | Freq: Three times a day (TID) | INTRAMUSCULAR | Status: DC
  Administered 2014-05-14 – 2014-05-16 (×6): 5000 [IU] via SUBCUTANEOUS
  Filled 2014-05-14 (×7): qty 1

## 2014-05-14 MED ORDER — ONDANSETRON HCL 4 MG PO TABS: 4.0000 mg | ORAL_TABLET | Freq: Four times a day (QID) | ORAL | Status: DC | PRN

## 2014-05-14 MED ORDER — PIPERACILLIN-TAZOBACTAM 3.375 G IVPB 30 MIN
3.3750 g | Freq: Once | INTRAVENOUS | Status: DC
  Filled 2014-05-14: qty 50

## 2014-05-14 MED ORDER — GUAIFENESIN-DM 100-10 MG/5ML PO SYRP
5.0000 mL | ORAL_SOLUTION | ORAL | Status: DC | PRN
  Filled 2014-05-14: qty 5

## 2014-05-14 MED ORDER — VANCOMYCIN HCL IN DEXTROSE 1-5 GM/200ML-% IV SOLN
1000.0000 mg | Freq: Once | INTRAVENOUS | Status: AC
  Administered 2014-05-14: 1000 mg via INTRAVENOUS
  Filled 2014-05-14: qty 200

## 2014-05-14 MED ORDER — PRO-STAT SUGAR FREE PO LIQD
30.0000 mL | Freq: Every day | ORAL | Status: DC
  Administered 2014-05-14 – 2014-05-15 (×2): 30 mL via ORAL
  Filled 2014-05-14 (×3): qty 30

## 2014-05-14 MED ORDER — PIPERACILLIN-TAZOBACTAM IN DEX 2-0.25 GM/50ML IV SOLN
2.2500 g | Freq: Three times a day (TID) | INTRAVENOUS | Status: DC
  Administered 2014-05-14 – 2014-05-16 (×6): 2.25 g via INTRAVENOUS
  Filled 2014-05-14 (×8): qty 50

## 2014-05-14 MED ORDER — SODIUM CHLORIDE 0.9 % IV SOLN: INTRAVENOUS | Status: AC

## 2014-05-14 MED ORDER — ONDANSETRON HCL 4 MG/2ML IJ SOLN: 4.0000 mg | Freq: Four times a day (QID) | INTRAMUSCULAR | Status: DC | PRN

## 2014-05-14 MED ORDER — SODIUM CHLORIDE 0.9 % IV BOLUS (SEPSIS)
500.0000 mL | Freq: Once | INTRAVENOUS | Status: AC
  Administered 2014-05-14: 500 mL via INTRAVENOUS

## 2014-05-14 MED ORDER — VANCOMYCIN HCL IN DEXTROSE 750-5 MG/150ML-% IV SOLN: 750.0000 mg | INTRAVENOUS | Status: DC

## 2014-05-14 MED ORDER — POLYETHYLENE GLYCOL 3350 17 G PO PACK
17.0000 g | PACK | Freq: Every day | ORAL | Status: DC | PRN
  Filled 2014-05-14: qty 1

## 2014-05-14 MED ORDER — HYDROCODONE-ACETAMINOPHEN 5-325 MG PO TABS: 1.0000 | ORAL_TABLET | ORAL | Status: DC | PRN

## 2014-05-14 MED ORDER — MORPHINE SULFATE 2 MG/ML IJ SOLN: 1.0000 mg | INTRAMUSCULAR | Status: DC | PRN

## 2014-05-14 NOTE — ED Provider Notes (Signed)
CSN: [id number]     Arrival date & time 05/14/14  0708 History   First MD Initiated Contact with Patient 05/14/14 0715     Chief Complaint  Patient presents with  . Cystitis    Pt has UTI, is from [facility] facility, staff felt she should be evaluated for possible sepsis.     (Consider location/radiation/quality/duration/timing/severity/associated sxs/prior Treatment) The history is provided by the patient.   patient was reportedly sent in for hypotension and hypoxia from the nursing home. Reportedly had blood pressure of 85 systolic. She's been in the 90s or low 100s for the last 4 days. Patient states she feels a little fatigued. She is currently on Bactrim and Keflex and has reported UTI and chronic infection of her left great toe.  Past Medical History  Diagnosis Date  . Diabetes mellitus without complication   . HTN (hypertension)   . CKD (chronic kidney disease)   . Systolic CHF   . CHF (congestive heart failure)   . COPD (chronic obstructive pulmonary disease)   . Peripheral vascular disease    Past Surgical History  Procedure Laterality Date  . Femur fracture surgery Left 03/2013    L distal femur lateral condyle s/p ORIF  . Orif femur fracture Left 04/11/2013    Procedure: OPEN REDUCTION INTERNAL FIXATION (ORIF) DISTAL FEMUR FRACTURE- left;  Surgeon: Senaida Lange, MD;  Location: [facility] OR;  Service: Orthopedics;  Laterality: Left;   Family History  Problem Relation Age of Onset  . Parkinson's disease Maternal Grandfather   . Heart attack Paternal Grandmother   . Heart attack Paternal Grandfather    History  Substance Use Topics  . Smoking status: Never Smoker   . Smokeless tobacco: Never Used  . Alcohol Use: No   OB History   Grav Para Term Preterm Abortions TAB SAB Ect Mult Living                 Review of Systems  Constitutional: Positive for appetite change and fatigue. Negative for fever and activity change.  Eyes: Negative for pain.  Respiratory: Negative  for cough, chest tightness and shortness of breath.   Cardiovascular: Negative for chest pain and leg swelling.  Gastrointestinal: Negative for nausea, vomiting, abdominal pain and diarrhea.  Genitourinary: Negative for flank pain.  Musculoskeletal: Negative for back pain and neck stiffness.  Skin: Positive for wound. Negative for rash.  Neurological: Negative for weakness, numbness and headaches.  Psychiatric/Behavioral: Negative for behavioral problems.      Allergies  Fish allergy and Actos  Home Medications   Prior to Admission medications   Medication Sig Start Date End Date Taking? Authorizing Provider  Amino Acids-Protein Hydrolys (FEEDING SUPPLEMENT, PRO-STAT SUGAR FREE 64,) LIQD Take 30 mLs by mouth every morning.   Yes Historical Provider, MD  aspirin EC 81 MG tablet Take 81 mg by mouth daily.   Yes Historical Provider, MD  cephALEXin (KEFLEX) 500 MG capsule Take 1 capsule (500 mg total) by mouth 4 (four) times daily. 05/09/14  Yes Vida Roller, MD  furosemide (LASIX) 20 MG tablet Take 60 mg by mouth daily.   Yes Historical Provider, MD  sulfamethoxazole-trimethoprim (SEPTRA DS) 800-160 MG per tablet Take 1 tablet by mouth every 12 (twelve) hours. 05/09/14  Yes Vida Roller, MD  acetaminophen (TYLENOL) 650 MG CR tablet Take 650 mg by mouth every 6 (six) hours as needed for pain.    Historical Provider, MD   BP 99/65  Pulse 93  Temp(Src)  97.5 F (36.4 C) (Oral)  Resp 16  Ht 5\' 7"  (1.702 m)  Wt 133 lb 9.6 oz (60.6 kg)  BMI 20.92 kg/m2  SpO2 94% Physical Exam  Constitutional: She appears well-developed.  HENT:  Patient lips are dry  Eyes: EOM are normal. Pupils are equal, round, and reactive to light.  Cardiovascular: Normal rate and regular rhythm.   Pulmonary/Chest: Effort normal and breath sounds normal.  Abdominal: Soft. Bowel sounds are normal.  Musculoskeletal:  No palpable dorsalis pedis pulse in either foot. Chronic edema bilateral lower extremities. There  is a deep ulcer on the tip of her right great toe. There is a deeper ulcer with exposed bone on the tip of her left great toe. Both are foul-smelling  Neurological: She is alert.  Patient is awake and appropriate  Skin: Skin is warm.    ED Course  Procedures (including critical care time) Labs Review Labs Reviewed  CBC WITH DIFFERENTIAL - Abnormal; Notable for the following:    WBC 12.3 (*)    RBC 3.18 (*)    Hemoglobin 9.0 (*)    HCT 27.8 (*)    RDW 18.0 (*)    Neutrophils Relative % 90 (*)    Neutro Abs 11.0 (*)    Lymphocytes Relative 5 (*)    Lymphs Abs 0.6 (*)    All other components within normal limits  URINALYSIS, ROUTINE W REFLEX MICROSCOPIC - Abnormal; Notable for the following:    Color, Urine AMBER (*)    APPearance HAZY (*)    Ketones, ur 15 (*)    Protein, ur 30 (*)    Leukocytes, UA SMALL (*)    All other components within normal limits  COMPREHENSIVE METABOLIC PANEL - Abnormal; Notable for the following:    Chloride 94 (*)    Glucose, Bld 61 (*)    BUN 122 (*)    Creatinine, Ser 3.81 (*)    Albumin 3.1 (*)    AST 618 (*)    ALT 215 (*)    Alkaline Phosphatase 233 (*)    GFR calc non Af Amer 9 (*)    GFR calc Af Amer 11 (*)    Anion gap 23 (*)    All other components within normal limits  URINE MICROSCOPIC-ADD ON - Abnormal; Notable for the following:    Bacteria, UA MANY (*)    All other components within normal limits  I-STAT CHEM 8, ED - Abnormal; Notable for the following:    Sodium 135 (*)    BUN 125 (*)    Creatinine, Ser 4.60 (*)    Glucose, Bld 67 (*)    Calcium, Ion 1.04 (*)    Hemoglobin 10.9 (*)    HCT 32.0 (*)    All other components within normal limits  URINE CULTURE  CULTURE, BLOOD (ROUTINE X 2)  CULTURE, BLOOD (ROUTINE X 2)  MRSA PCR SCREENING  I-STAT CG4 LACTIC ACID, ED  CBG MONITORING, ED    Imaging Review Dg Chest 2 View  05/14/2014   CLINICAL DATA:  Hypoxia  EXAM: CHEST  2 VIEW  COMPARISON:  02/12/2014 and multiple  previous  FINDINGS: Background pattern of extensive chronic pleural calcification on the right with a small right hemithorax. Further enlargement of the cardiac silhouette. Interstitial and alveolar pulmonary edema consistent with congestive heart failure. Probable pleural effusion on the left. Volume loss of the left lower lobe.  IMPRESSION: Further enlargement of the cardiac silhouette. Interstitial and alveolar edema, all consistent with  congestive heart failure. Pleural effusion on the left with collapse of left lower lobe. Chronic pleural calcification on the right with a small right hemithorax.   Electronically Signed   By: Paulina FusiMark  Shogry M.D.   On: 05/14/2014 08:48     EKG Interpretation None      MDM   Final diagnoses:  UTI (lower urinary tract infection)  Renal failure (ARF), acute on chronic  Gangrene of toe    Patient has urinary tract infection and acute on chronic renal failure. Also has gangrene chronically of her toe. Worsening renal function. Will admit to internal medicine    Juliet RudeNathan R. Rubin PayorPickering, MD 05/14/14 1505

## 2014-05-14 NOTE — ED Notes (Signed)
Per [facility], pt is from [facility] facility, pt is currently being treated with antibiotics for UTI and Gangrene in the left foot. Per staff at facility vital signs have been questionable with hypotension and decreased O2 saturation noted at facility. Pt vital signs stable with EMS, pt alert and oriented X4.

## 2014-05-14 NOTE — Progress Notes (Signed)
Notified that pt will be coming to the unit.

## 2014-05-14 NOTE — ED Notes (Signed)
Copy of lactic Acid 1.69 mmol/L given to RN to give to MD Abnormal Chem 8 given to RN Lawson Fiscal

## 2014-05-14 NOTE — ED Notes (Signed)
Admitting doctor at bedside 

## 2014-05-14 NOTE — Consult Note (Signed)
WOC wound consult note Reason for Consult: Traumatic injury of RGT, Gangrene of LGT, Unstageable pressure ulcer on right heel Wound type: pressure, arterial insufficiency and infection Pressure Ulcer POA: Yes Measurement:Right heel: 3cm x 3.5cm stable, dry eschar.  Right great toe: (site of nail removal): clean, dry wound bed measuring 1.5cm x 1cm x 0cm depth.  Right lateral toe: 1.5cm x 1cm with wound bed 100% obscured by the presence of yellow necrotic slough.  Depth is not known.  Left great toe:  Edematous, foul-smelling, necrotic presentation measuring 2.5cm x 4cm with depth not known.  Wound bed:As described above Drainage (amount, consistency, odor) As noted above. Periwound:As noted above.  Bilateral feet are dry and flaking. Dressing procedure/placement/frequency:I have implemented a conservative POC for this patient's feet:  Cleansing with NS, followed by application of a betadine swabstick to act as an astringent and as an antimicrobial. After air-drying, the wounds will be coved with a dry dressing and secured with tape or kerlix and tape. Orders are provided. WOC nursing team will not follow, but will remain available to this patient, the nursing and medical team.  Please re-consult if needed. Thanks, Ladona Mow, MSN, RN, GNP, [place], [facility] [phone])

## 2014-05-14 NOTE — H&P (Addendum)
Patient Demographics  Alyssa Knight, is a 80 y.o. female  MRN: [id number]   DOB - 19-Mar-1917  Admit Date - 05/14/2014  Outpatient Primary MD for the patient is Thora LanceEHINGER,ROBERT R, MD   With History of -  Past Medical History  Diagnosis Date  . Diabetes mellitus without complication   . HTN (hypertension)   . CKD (chronic kidney disease)   . Systolic CHF   . CHF (congestive heart failure)   . COPD (chronic obstructive pulmonary disease)   . Peripheral vascular disease       Past Surgical History  Procedure Laterality Date  . Femur fracture surgery Left 03/2013    L distal femur lateral condyle s/p ORIF  . Orif femur fracture Left 04/11/2013    Procedure: OPEN REDUCTION INTERNAL FIXATION (ORIF) DISTAL FEMUR FRACTURE- left;  Surgeon: Senaida LangeKevin M Supple, MD;  Location: [facility] OR;  Service: Orthopedics;  Laterality: Left;    in for   Chief Complaint  Patient presents with  . Cystitis    Pt has UTI, is from [facility], staff felt she should be evaluated for possible sepsis.     HPI  Alyssa Knight  is a 80 y.o. female, who is a nursing home resident with history of severe chronic systolic heart failure EF around 15%, chronic pleural effusions left more than right, severe PAD with gangrene in both large toes left more than right, diabetes mellitus type 2 on diet control, hypertension, chronic kidney disease stage IV baseline creatinine around 1.9, left femur fracture in the past, was being treated with Bactrim for UTI and toe gangrene at [facility] was brought to the ER when she was found to have worsening renal function and low blood pressure.  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 Of note patient has been evaluated for toe amputation in the past however patient and family refused surgical intervention and wanted gentle treatment only.    Review of Systems    In addition to the HPI above,   No Fever-chills, No Headache, No changes with Vision or hearing, No problems swallowing food or Liquids, No Chest pain, Cough or Shortness of Breath, No Abdominal pain, No Nausea or Vommitting, Bowel movements are regular, No Blood in stool or Urine, No dysuria, No new skin rashes or bruises, No new joints pains-aches,  No new weakness, tingling, numbness in any extremity, feels weak everywhere, No recent weight gain or loss, No polyuria, polydypsia or polyphagia, No significant Mental Stressors.  A full 10 point Review of Systems was done, except as stated above, all other Review of Systems were negative.   Social History History  Substance Use Topics  . Smoking status: Never Smoker   . Smokeless tobacco: Never Used  . Alcohol Use: No      Family History  Family History  Problem Relation Age of Onset  . Parkinson's disease Maternal Grandfather   . Heart attack Paternal Grandmother   . Heart attack Paternal Grandfather       Prior to Admission medications   Medication Sig Start Date End Date Taking? Authorizing Provider  Amino Acids-Protein Hydrolys (FEEDING SUPPLEMENT, PRO-STAT SUGAR FREE 64,) LIQD Take 30 mLs by mouth every morning.   Yes Historical Provider, MD  aspirin EC 81 MG tablet Take 81 mg by mouth daily.   Yes Historical Provider, MD  cephALEXin (KEFLEX) 500 MG capsule Take 1 capsule (500 mg total) by mouth 4 (four) times daily. 05/09/14  Yes Vida Roller, MD  furosemide (LASIX) 20 MG tablet Take 60 mg by mouth daily.   Yes Historical Provider, MD  sulfamethoxazole-trimethoprim (SEPTRA DS) 800-160 MG per tablet Take 1 tablet by mouth every 12 (twelve) hours. 05/09/14  Yes Vida Roller, MD  acetaminophen (TYLENOL) 650 MG CR tablet Take  650 mg by mouth every 6 (six) hours as needed for pain.    Historical Provider, MD    Allergies  Allergen Reactions  . Fish Allergy Shortness Of Breath  . Actos [Pioglitazone] Other (See Comments)    May have dropped blood sugar too much-family unsure    Physical Exam  Vitals  Blood pressure 101/53, pulse 93, temperature 97.9 F (36.6 C), temperature source Rectal, resp. rate 20, SpO2 98.00%.   1. General frail elderly African American female lying in bed in NAD,     2. Normal affect and insight, Not Suicidal or Homicidal, Awake Alert, answers questions appropriately  3. No F.N deficits, ALL C.Nerves Intact, Strength 5/5 all 4 extremities, Sensation intact all 4 extremities, Plantars down going.  4. Ears and Eyes appear Normal, Conjunctivae clear, PERRLA. Moist Oral Mucosa.  5. Supple Neck, No JVD, No cervical lymphadenopathy appriciated, No Carotid Bruits.  6. Symmetrical Chest wall movement, Good air movement bilaterally, bibasilar rales left more than right  7. RRR, No Gallops, Rubs or Murmurs, No Parasternal Heave.  8. Positive Bowel Sounds, Abdomen Soft, suprapubic fullness, No tenderness, No organomegaly appriciated,No rebound -guarding or rigidity.  9.  No Cyanosis, Normal Skin Turgor, No Skin Rash or Bruise. Left large toe has essentially no skin with exposed bone, ulcer appears dry, right first and second toe also have dry ulcer on top. She has 2+ chronic leg edema  10. Good muscle tone,  joints appear normal , no effusions, Normal ROM.  11. No Palpable Lymph Nodes in Neck or Axillae     Data Review  CBC  Recent Labs Lab 05/08/14 2205 05/14/14 0755 05/14/14 0801  WBC 8.6 12.3*  --   HGB 9.4* 9.0* 10.9*  HCT 30.4* 27.8* 32.0*  PLT 214 190  --   MCV 89.9 87.4  --   MCH 27.8 28.3  --   MCHC 30.9 32.4  --   RDW 17.9* 18.0*  --   LYMPHSABS  --  0.6*  --   MONOABS  --  0.6  --   EOSABS  --  0.1  --   BASOSABS  --  0.0  --     ------------------------------------------------------------------------------------------------------------------  Chemistries   Recent Labs Lab 05/08/14 2205 05/14/14 0755 05/14/14 0801  NA 139 139 135*  K 3.6* 4.4 4.2  CL 97 94* 99  CO2 26 22  --   GLUCOSE 188* 61* 67*  BUN 90* 122* 125*  CREATININE 2.03* 3.81* 4.60*  CALCIUM 8.8 8.7  --  AST  --  618*  --   ALT  --  215*  --   ALKPHOS  --  233*  --   BILITOT  --  0.6  --    ------------------------------------------------------------------------------------------------------------------ CrCl is unknown because both a height and weight (above a minimum accepted value) are required for this calculation. ------------------------------------------------------------------------------------------------------------------ No results found for this basename: TSH, T4TOTAL, FREET3, T3FREE, THYROIDAB,  in the last 72 hours   Coagulation profile No results found for this basename: INR, PROTIME,  in the last 168 hours ------------------------------------------------------------------------------------------------------------------- No results found for this basename: DDIMER,  in the last 72 hours -------------------------------------------------------------------------------------------------------------------  Cardiac Enzymes No results found for this basename: CK, CKMB, TROPONINI, MYOGLOBIN,  in the last 168 hours ------------------------------------------------------------------------------------------------------------------ No components found with this basename: POCBNP,    ---------------------------------------------------------------------------------------------------------------  Urinalysis    Component Value Date/Time   COLORURINE AMBER* 05/14/2014 0924   APPEARANCEUR HAZY* 05/14/2014 0924   LABSPEC 1.021 05/14/2014 0924   PHURINE 5.0 05/14/2014 0924   GLUCOSEU NEGATIVE 05/14/2014 0924   HGBUR NEGATIVE 05/14/2014  0924   BILIRUBINUR NEGATIVE 05/14/2014 0924   KETONESUR 15* 05/14/2014 0924   PROTEINUR 30* 05/14/2014 0924   UROBILINOGEN 1.0 05/14/2014 0924   NITRITE NEGATIVE 05/14/2014 0924   LEUKOCYTESUR SMALL* 05/14/2014 0924    ----------------------------------------------------------------------------------------------------------------  Imaging results:   Dg Chest 2 View  05/14/2014   CLINICAL DATA:  Hypoxia  EXAM: CHEST  2 VIEW  COMPARISON:  02/12/2014 and multiple previous  FINDINGS: Background pattern of extensive chronic pleural calcification on the right with a small right hemithorax. Further enlargement of the cardiac silhouette. Interstitial and alveolar pulmonary edema consistent with congestive heart failure. Probable pleural effusion on the left. Volume loss of the left lower lobe.  IMPRESSION: Further enlargement of the cardiac silhouette. Interstitial and alveolar edema, all consistent with congestive heart failure. Pleural effusion on the left with collapse of left lower lobe. Chronic pleural calcification on the right with a small right hemithorax.   Electronically Signed   By: Paulina Fusi M.D.   On: 05/14/2014 08:48      Echo Nov 2014  Study Conclusions  - Left ventricle: The cavity size was normal. Systolic function was severely reduced. The estimated ejection fraction was in the range of 15% to 20%. - Mitral valve: Mild regurgitation. - Left atrium: The atrium was severely dilated. - Right ventricle: The cavity size was mildly dilated. Wall thickness was normal. Systolic function was reduced. - Right atrium: The atrium was moderately to severely dilated.   ABI 2015 - severe PAD    Assessment & Plan   1. Sepsis with hypotension and some dehydration - cause UTI (bladder full) along with acute on chronic bilateral first toe gangrene. Discussed the plan of care with patient and son, they do not want any heroic intervention but open for general medical treatment. Will draw  blood and urine cultures, place on vancomycin and Zosyn for now, monitor culture results. If no rapid improvement focus on comfort care.    2. PAD with gangrene of both large toes left more than right. IV antibiotics as in #1 above, involve wound care. Family patient do not want amputation. Continue aspirin for PAD.     3. Hypotension. Due to #1 along with dehydration, hold Lasix gentle hydration.     4. Chronic severe systolic heart failure with EF 15%. Patient has chronic eye lateral pleural effusions left more than right, for now she is hypotensive and I will hold Lasix.  Gentle hydration. Monitor closely.    5. Acute renal failure on chronic kidney disease stage IV. Baseline creatinine around 1.9, discontinue Bactrim, hold Lasix, gentle hydration, repeat BMP in the morning. Will check urine electrolytes. Foley for now monitor intake and output closely.    6. Severe protein calorie malnutrition. Continue protein supplementation.      DVT Prophylaxis Heparin    AM Labs Ordered, also please review Full Orders  Family Communication: Admission, patients condition and plan of care including tests being ordered have been discussed with the patient and son who indicate understanding and agree with the plan and Code Status.  Code Status DNR  Likely DC to  SNF  Condition GUARDED     Time spent in minutes : 35    SINGH,PRASHANT K M.D on 05/14/2014 at 10:54 AM  Between 7am to 7pm - Pager - [phone]  After 7pm go to [email or web address] - password [email or web address]  And look for the night coverage person covering me after hours  [facility] Office  [phone]   **Disclaimer: This note may have been dictated with voice recognition software. Similar sounding words can inadvertently be transcribed and this note may contain transcription errors which may not have been corrected upon publication of note.**

## 2014-05-14 NOTE — Progress Notes (Signed)
Report received. Awaiting arrival to the unit.

## 2014-05-14 NOTE — Progress Notes (Signed)
ANTIBIOTIC CONSULT NOTE - INITIAL  Pharmacy Consult for Zosyn and vancomycin Indication: cellulitis and bilateral great toe gangrene  Allergies  Allergen Reactions  . Fish Allergy Shortness Of Breath  . Actos [Pioglitazone] Other (See Comments)    May have dropped blood sugar too much-family unsure    Patient Measurements:   Body Weight: 58 kg  Vital Signs: Temp: 97.9 F (36.6 C) (07/19 0732) Temp src: Rectal (07/19 0732) BP: 101/53 mmHg (07/19 0732) Pulse Rate: 93 (07/19 0732) Intake/Output from previous day:   Intake/Output from this shift:    Labs:  Recent Labs  05/14/14 0755 05/14/14 0801  WBC 12.3*  --   HGB 9.0* 10.9*  PLT 190  --   CREATININE 3.81* 4.60*   The CrCl is unknown because both a height and weight (above a minimum accepted value) are required for this calculation. No results found for this basename: VANCOTROUGH, VANCOPEAK, VANCORANDOM, GENTTROUGH, GENTPEAK, GENTRANDOM, TOBRATROUGH, TOBRAPEAK, TOBRARND, AMIKACINPEAK, AMIKACINTROU, AMIKACIN,  in the last 72 hours   Microbiology: No results found for this or any previous visit (from the past 720 hour(s)).  Medical History: Past Medical History  Diagnosis Date  . Diabetes mellitus without complication   . HTN (hypertension)   . CKD (chronic kidney disease)   . Systolic CHF   . CHF (congestive heart failure)   . COPD (chronic obstructive pulmonary disease)   . Peripheral vascular disease     Medications:  See home med list Assessment: 80 year old woman who resides at [facility] to start on zosyn and vancomycin for cellulitis (bilateral great toe gangrene - amputation offered and refused by patient/family).  She has been on cephalexin prior to admission.  Goal of Therapy:  Vancomycin trough level 10-15 mcg/ml  Plan:  Measure antibiotic drug levels at steady state Follow up culture results Follow renal function Zosyn 2.25g IV q8h Vancomycin 1g IV x 1 dose, then 750mg  IV  q48h   Mickeal Skinner 05/14/2014,11:04 AM

## 2014-05-14 NOTE — Progress Notes (Signed)
Pt admitted to the unit at 1134. Pt mental status is alert and oriented x4. Pt oriented to room, staff, and call bell. Skin is intact. Gangrene noted to bilateral great toes. Full assessment charted in [facility]. Call bell within reach. Visitor guidelines reviewed w/ pt and/or family.

## 2014-05-14 NOTE — Progress Notes (Signed)
Pt. Family does not want foley catheter. MD made aware. Foley catheter will be removed.

## 2014-05-14 NOTE — Progress Notes (Signed)
Called for report. Nurse unavailable. Left number for her to call me back.

## 2014-05-15 ENCOUNTER — Encounter: Payer: Self-pay | Admitting: Vascular Surgery

## 2014-05-15 DIAGNOSIS — A419 Sepsis, unspecified organism: Principal | ICD-10-CM

## 2014-05-15 LAB — BASIC METABOLIC PANEL
Anion gap: 24 — ABNORMAL HIGH (ref 5–15)
BUN: 126 mg/dL — ABNORMAL HIGH (ref 6–23)
CALCIUM: 8.2 mg/dL — AB (ref 8.4–10.5)
CO2: 19 mEq/L (ref 19–32)
CREATININE: 4.02 mg/dL — AB (ref 0.50–1.10)
Chloride: 95 mEq/L — ABNORMAL LOW (ref 96–112)
GFR calc non Af Amer: 9 mL/min — ABNORMAL LOW (ref >90)
GFR, EST AFRICAN AMERICAN: 10 mL/min — AB (ref >90)
Glucose, Bld: 62 mg/dL — ABNORMAL LOW (ref 70–99)
Potassium: 4.7 mEq/L (ref 3.7–5.3)
Sodium: 138 mEq/L (ref 137–147)

## 2014-05-15 LAB — CBC
HCT: 27.2 % — ABNORMAL LOW (ref 36.0–46.0)
Hemoglobin: 8.9 g/dL — ABNORMAL LOW (ref 12.0–15.0)
MCH: 28.2 pg (ref 26.0–34.0)
MCHC: 32.7 g/dL (ref 30.0–36.0)
MCV: 86.1 fL (ref 78.0–100.0)
Platelets: 180 10*3/uL (ref 150–400)
RBC: 3.16 MIL/uL — ABNORMAL LOW (ref 3.87–5.11)
RDW: 17.9 % — AB (ref 11.5–15.5)
WBC: 12.8 10*3/uL — ABNORMAL HIGH (ref 4.0–10.5)

## 2014-05-15 LAB — URINE CULTURE
CULTURE: NO GROWTH
Colony Count: NO GROWTH

## 2014-05-15 MED ORDER — SODIUM CHLORIDE 0.9 % IV SOLN
INTRAVENOUS | Status: AC
  Administered 2014-05-15: 40 mL/h via INTRAVENOUS

## 2014-05-15 NOTE — Progress Notes (Signed)
Pt has low BP. Ghimire MD aware.

## 2014-05-15 NOTE — Clinical Social Work Psychosocial (Signed)
Clinical Social Work Department BRIEF PSYCHOSOCIAL ASSESSMENT 05/15/2014  Patient:  Alyssa Knight, Alyssa Knight     Account Number:  [id number]     Admit date:  05/14/2014  Clinical Social Worker:  Lovey Newcomer  Date/Time:  05/15/2014 02:36 PM  Referred by:  Physician  Date Referred:  05/15/2014 Referred for  SNF Placement   Other Referral:   Interview type:  Family Other interview type:   Patient's son interviewed at bedside to complete assessment.    PSYCHOSOCIAL DATA Living Status:  FACILITY Admitted from facility:  [facility] Level of care:  [place] Primary support name:  Alyssa Knight Primary support relationship to patient:  CHILD, ADULT Degree of support available:   Support is good.    CURRENT CONCERNS Current Concerns  Post-Acute Placement   Other Concerns:    SOCIAL WORK ASSESSMENT / PLAN CSW met with patient and son at bedside to complete assessment. Son Alyssa Knight states that the patient has been living at [facility] for about 4 months and states that the plan is for the patient to return at discharge. Patient's son is happy with the care the patient receives at [facility]. CSW will assist.   Assessment/plan status:  Psychosocial Support/Ongoing Assessment of Needs Other assessment/ plan:   Complete FL2, Fax, PASRR   Information/referral to community resources:   NONE    PATIENT'S/FAMILY'S RESPONSE TO PLAN OF CARE: Patient's family plans for patient to return to [facility] at discharge. CSW will assist.         Liz Beach MSW, [place], [place], [phone]

## 2014-05-15 NOTE — Progress Notes (Signed)
Utilization review completed.  

## 2014-05-15 NOTE — Progress Notes (Signed)
PATIENT DETAILS Name: Alyssa Knight Age: 80 y.o. Sex: female Date of Birth: [birth year] Admit Date: 05/14/2014 Admitting Physician Leroy Sea, MD VHQ:[id number],MWUXLK R, MD  Subjective: Patient awake and alert, in good spirits. No acute problems. Denies pain.   Assessment/Plan: Principal Problem: Sepsis:  - due to UTI and chronic gangrene - On IV Vanc and Zosyn-day 2, since gangrene is a chronic issue, we'll stop IV vancomycin, continue with Zosyn. - WBC at 12.8 , blood pressure borderline stable. - Blood/urine culture on 7/19 pending  UTI: - Covered with Zosyn - Culture pending, nontoxic looking, leukocytosis essentially unchanged. However afebrile overnight. Await urine cultures.  PAD with bilateral first toe gangrene: - Conservative tx with IV abx - Family doesn't want amputation - continue aspirin - wound care involved  ARF on CKD Stage IV: - Creatinine still high, 4.02 this morning, baseline around 1.9-2.0 - lasix being held, will gently hydrate with IVF. Given increase in LFTs, worsening kidney function-? Cardiorenal syndrome. Not a candidate for dialysis. Recheck electrolytes in a.m. Spoke with son over the phone, explained overall poor prognosis  Transaminitis -? Congestive hepatitis- given history of severe systolic heart failure. -repeat LFT's in am  Chronic Systolic HF:  - bilateral pleural effusions - lasix being held  Disposition: Remain inpatient-back to SNF in am  DVT Prophylaxis: Heparin   Code Status: DNR  Family Communication Spoke with son over the phone-poor overall prognosis explained.  Procedures:  None  CONSULTS:  None  Time spent 40 minutes-which includes 50% of the time with face-to-face with patient/ family and coordinating care related to the above assessment and plan.  MEDICATIONS: Scheduled Meds: . aspirin EC  81 mg Oral Daily  . feeding supplement (PRO-STAT SUGAR FREE 64)  30 mL Oral Daily  . heparin  5,000  Units Subcutaneous 3 times per day  . piperacillin-tazobactam (ZOSYN)  IV  2.25 g Intravenous 3 times per day  . [START ON 05/16/2014] vancomycin  750 mg Intravenous Q48H   Continuous Infusions:  PRN Meds:.guaiFENesin-dextromethorphan, HYDROcodone-acetaminophen, morphine injection, ondansetron (ZOFRAN) IV, ondansetron, polyethylene glycol  Antibiotics: Anti-infectives   Start     Dose/Rate Route Frequency Ordered Stop   05/16/14 1000  vancomycin (VANCOCIN) IVPB 750 mg/150 ml premix     750 mg 150 mL/hr over 60 Minutes Intravenous Every 48 hours 05/14/14 1103     05/14/14 1200  piperacillin-tazobactam (ZOSYN) IVPB 2.25 g     2.25 g 100 mL/hr over 30 Minutes Intravenous 3 times per day 05/14/14 1103     05/14/14 1115  vancomycin (VANCOCIN) IVPB 1000 mg/200 mL premix     1,000 mg 200 mL/hr over 60 Minutes Intravenous  Once 05/14/14 1103 05/14/14 1448   05/14/14 1030  piperacillin-tazobactam (ZOSYN) IVPB 3.375 g  Status:  Discontinued     3.375 g 100 mL/hr over 30 Minutes Intravenous  Once 05/14/14 1017 05/14/14 1028       PHYSICAL EXAM: Vital signs in last 24 hours: Filed Vitals:   05/14/14 1400 05/14/14 2136 05/15/14 0500 05/15/14 0602  BP: 99/65 108/65  100/59  Pulse: 93 87  92  Temp: 97.5 F (36.4 C) 97.4 F (36.3 C)  97.5 F (36.4 C)  TempSrc: Oral Oral  Oral  Resp: 16 18  17   Height:      Weight:   60.011 kg (132 lb 4.8 oz)   SpO2: 94% 95%  99%    Weight change:  [employer]  05/14/14 1145 05/15/14 0500  Weight: 60.6 kg (133 lb 9.6 oz) 60.011 kg (132 lb 4.8 oz)   Body mass index is 20.72 kg/(m^2).   Gen Exam: Awake and alert with clear speech.   Neck: Supple, No JVD.   Chest: B/L bibasilar rales. Normal respiratory effort.  CVS: S1 S2 Regular, no murmurs.  Abdomen: soft, BS +, non tender, non distended.  Extremities: 2+ edema, lower extremities warm to touch. Unable to palpate pulses. No signs of cyanosis. Neurologic: Non Focal.   Skin: No Rash.   Wounds:  Bilateral great toe gangrene. Left great toe without toe nail and dry green wound bed. Right great toe and second toe involvement with dry green wound bed.   Intake/Output from previous day:  Intake/Output Summary (Last 24 hours) at 05/15/14 0818 Last data filed at 05/14/14 2355  Gross per 24 hour  Intake 1093.75 ml  Output      0 ml  Net 1093.75 ml     LAB RESULTS: CBC  Recent Labs Lab 05/08/14 2205 05/14/14 0755 05/14/14 0801 05/15/14 0524  WBC 8.6 12.3*  --  12.8*  HGB 9.4* 9.0* 10.9* 8.9*  HCT 30.4* 27.8* 32.0* 27.2*  PLT 214 190  --  180  MCV 89.9 87.4  --  86.1  MCH 27.8 28.3  --  28.2  MCHC 30.9 32.4  --  32.7  RDW 17.9* 18.0*  --  17.9*  LYMPHSABS  --  0.6*  --   --   MONOABS  --  0.6  --   --   EOSABS  --  0.1  --   --   BASOSABS  --  0.0  --   --     Chemistries   Recent Labs Lab 05/08/14 2205 05/14/14 0755 05/14/14 0801 05/15/14 0524  NA 139 139 135* 138  K 3.6* 4.4 4.2 4.7  CL 97 94* 99 95*  CO2 26 22  --  19  GLUCOSE 188* 61* 67* 62*  BUN 90* 122* 125* 126*  CREATININE 2.03* 3.81* 4.60* 4.02*  CALCIUM 8.8 8.7  --  8.2*    CBG:  Recent Labs Lab 05/09/14 0027 05/14/14 0912  GLUCAP 161* 99    GFR Estimated Creatinine Clearance: 7.9 ml/min (by C-G formula based on Cr of 4.02).  Coagulation profile No results found for this basename: INR, PROTIME,  in the last 168 hours  Cardiac Enzymes No results found for this basename: CK, CKMB, TROPONINI, MYOGLOBIN,  in the last 168 hours  No components found with this basename: POCBNP,  No results found for this basename: DDIMER,  in the last 72 hours No results found for this basename: HGBA1C,  in the last 72 hours No results found for this basename: CHOL, HDL, LDLCALC, TRIG, CHOLHDL, LDLDIRECT,  in the last 72 hours No results found for this basename: TSH, T4TOTAL, FREET3, T3FREE, THYROIDAB,  in the last 72 hours No results found for this basename: VITAMINB12, FOLATE, FERRITIN, TIBC, IRON,  RETICCTPCT,  in the last 72 hours No results found for this basename: LIPASE, AMYLASE,  in the last 72 hours  Urine Studies No results found for this basename: UACOL, UAPR, USPG, UPH, UTP, UGL, UKET, UBIL, UHGB, UNIT, UROB, ULEU, UEPI, UWBC, URBC, UBAC, CAST, CRYS, UCOM, BILUA,  in the last 72 hours  MICROBIOLOGY: Recent Results (from the past 240 hour(s))  MRSA PCR SCREENING     Status: None   Collection Time    05/14/14 11:37 AM  Result Value Ref Range Status   MRSA by PCR NEGATIVE  NEGATIVE Final   Comment:            The GeneXpert MRSA Assay (FDA     approved for NASAL specimens     only), is one component of a     comprehensive MRSA colonization     surveillance program. It is not     intended to diagnose MRSA     infection nor to guide or     monitor treatment for     MRSA infections.    RADIOLOGY STUDIES/RESULTS: Dg Chest 2 View  05/14/2014   CLINICAL DATA:  Hypoxia  EXAM: CHEST  2 VIEW  COMPARISON:  02/12/2014 and multiple previous  FINDINGS: Background pattern of extensive chronic pleural calcification on the right with a small right hemithorax. Further enlargement of the cardiac silhouette. Interstitial and alveolar pulmonary edema consistent with congestive heart failure. Probable pleural effusion on the left. Volume loss of the left lower lobe.  IMPRESSION: Further enlargement of the cardiac silhouette. Interstitial and alveolar edema, all consistent with congestive heart failure. Pleural effusion on the left with collapse of left lower lobe. Chronic pleural calcification on the right with a small right hemithorax.   Electronically Signed   By: Paulina Fusi M.D.   On: 05/14/2014 08:48   Ct Head Wo Contrast  05/08/2014   CLINICAL DATA:  General weakness.  EXAM: CT HEAD WITHOUT CONTRAST  TECHNIQUE: Contiguous axial images were obtained from the base of the skull through the vertex without intravenous contrast.  COMPARISON:  None.  FINDINGS: Diffuse prominence of the CSF  containing spaces is compatible with generalized cerebral atrophy. Scattered and confluent hypodensity within the periventricular and deep white matter both cerebral hemispheres is most consistent with moderate small vessel ischemic changes. Prominent vascular calcifications present within the carotid siphons bilaterally.  There is no acute intracranial hemorrhage or infarct. No mass lesion or midline shift. Gray-white matter differentiation is well maintained. Ventricles are normal in size without evidence of hydrocephalus. CSF containing spaces are within normal limits. No extra-axial fluid collection.  The calvarium is intact.  Orbital soft tissues are within normal limits.  The paranasal sinuses and mastoid air cells are well pneumatized and free of fluid.  Scalp soft tissues are unremarkable.  IMPRESSION: 1. No acute intracranial abnormality. 2. Generalized cerebral atrophy with moderate chronic microvascular ischemic disease.   Electronically Signed   By: Rise Mu M.D.   On: 05/08/2014 23:06   Dg Toe Great Left  05/09/2014   CLINICAL DATA:  Wound infection.  Weakness.  EXAM: LEFT GREAT TOE  COMPARISON:  11/18/2013  FINDINGS: Large ulcer involving the tip of the great toe. The tuft of the great toe appears exposed to air. No fragmentation or erosion. No joint narrowing, fracture, or dislocation. There are soft tissue and vascular calcifications of the great toe.  IMPRESSION: Large soft tissue ulcer of the great toe, with possible distal phalanx exposure. No changes of osteomyelitis.   Electronically Signed   By: Tiburcio Pea M.D.   On: 05/09/2014 01:11    [employer]   [facility] Pager:[phone]  If 7PM-7AM, please contact night-coverage [email or web address] Password [email or web address] 05/15/2014, 8:18 AM   LOS: 1 day   **Disclaimer: This note may have been dictated with voice recognition software. Similar sounding words can inadvertently be transcribed and this  note may contain transcription errors which may not have been corrected upon publication  of note.**  Attending Patient was seen, examined,treatment plan was discussed with the Physician extender. I have directly reviewed the clinical findings, lab, imaging studies and management of this patient in detail. I have made the necessary changes to the above noted documentation, and agree with the documentation, as recorded by the Physician extender.  Windell Norfolk MD [facility].

## 2014-05-16 ENCOUNTER — Ambulatory Visit: Payer: Medicare Other | Admitting: Vascular Surgery

## 2014-05-16 DIAGNOSIS — R740 Nonspecific elevation of levels of transaminase and lactic acid dehydrogenase [LDH]: Secondary | ICD-10-CM

## 2014-05-16 DIAGNOSIS — R7402 Elevation of levels of lactic acid dehydrogenase (LDH): Secondary | ICD-10-CM

## 2014-05-16 LAB — COMPREHENSIVE METABOLIC PANEL
ALBUMIN: 2.7 g/dL — AB (ref 3.5–5.2)
ALK PHOS: 167 U/L — AB (ref 39–117)
ALT: 388 U/L — AB (ref 0–35)
ANION GAP: 22 — AB (ref 5–15)
AST: 823 U/L — ABNORMAL HIGH (ref 0–37)
BUN: 136 mg/dL — ABNORMAL HIGH (ref 6–23)
CO2: 18 mEq/L — ABNORMAL LOW (ref 19–32)
Calcium: 8.2 mg/dL — ABNORMAL LOW (ref 8.4–10.5)
Chloride: 95 mEq/L — ABNORMAL LOW (ref 96–112)
Creatinine, Ser: 4.54 mg/dL — ABNORMAL HIGH (ref 0.50–1.10)
GFR calc Af Amer: 9 mL/min — ABNORMAL LOW (ref >90)
GFR calc non Af Amer: 7 mL/min — ABNORMAL LOW (ref >90)
Glucose, Bld: 65 mg/dL — ABNORMAL LOW (ref 70–99)
Potassium: 4.6 mEq/L (ref 3.7–5.3)
SODIUM: 135 meq/L — AB (ref 137–147)
TOTAL PROTEIN: 6.7 g/dL (ref 6.0–8.3)
Total Bilirubin: 0.6 mg/dL (ref 0.3–1.2)

## 2014-05-16 MED ORDER — MORPHINE SULFATE (CONCENTRATE) 10 MG /0.5 ML PO SOLN: 5.0000 mg | Freq: Every day | ORAL | Status: AC

## 2014-05-16 MED ORDER — LORAZEPAM 1 MG PO TABS: 1.0000 mg | ORAL_TABLET | Freq: Every day | ORAL | Status: AC

## 2014-05-16 MED ORDER — LORAZEPAM 1 MG PO TABS: 1.0000 mg | ORAL_TABLET | Freq: Four times a day (QID) | ORAL | Status: AC | PRN

## 2014-05-16 MED ORDER — ONDANSETRON HCL 4 MG PO TABS: 4.0000 mg | ORAL_TABLET | Freq: Four times a day (QID) | ORAL | Status: AC | PRN

## 2014-05-16 MED ORDER — POLYETHYLENE GLYCOL 3350 17 G PO PACK: 17.0000 g | PACK | Freq: Every day | ORAL | Status: AC

## 2014-05-16 MED ORDER — MORPHINE SULFATE (CONCENTRATE) 10 MG /0.5 ML PO SOLN: 5.0000 mg | ORAL | Status: AC | PRN

## 2014-05-16 NOTE — Progress Notes (Signed)
Patient reported SOB this am- 3L of 02 placed on patient. Patient stated that she felt a lot better and wanted to sit up. O2 this am was 90. Will continue to monitor patient.

## 2014-05-16 NOTE — Clinical Social Work Note (Signed)
Per MD patient ready for DC back to [facility]. RN, patient, patient's son Jillyn Hidden, and facility made aware of DC. RN given number for report. DC packet on chart. Ambulance transport requested. CSW signing off at this time.  Roddie Mc MSW, [place], [place], [phone]

## 2014-05-16 NOTE — Care Management Note (Signed)
    Page 1 of 1   05/16/2014     4:55:13 PM CARE MANAGEMENT NOTE 05/16/2014  Patient:  Alyssa Knight, Alyssa Knight   Account Number:  [id number]  Date Initiated:  05/16/2014  Documentation initiated by:  Letha Cape  Subjective/Objective Assessment:   dx sepsis, chronic vascular dz, ckd, uti, chronic chf  admit- from [facility] SNF     Action/Plan:   Anticipated DC Date:  05/16/2014   Anticipated DC Plan:  SKILLED NURSING FACILITY  In-house referral  Clinical Social Worker      DC Planning Services  CM consult      Choice offered to / List presented to:             Status of service:  Completed, signed off Medicare Important Message given?  NA - LOS <3 / Initial given by admissions (If response is "NO", the following Medicare IM given date fields will be blank) Date Medicare IM given:   Medicare IM given by:   Date Additional Medicare IM given:   Additional Medicare IM given by:    Discharge Disposition:  SKILLED NURSING FACILITY  Per UR Regulation:  Reviewed for med. necessity/level of care/duration of stay  If discussed at Long Length of Stay Meetings, dates discussed:    Comments:

## 2014-05-16 NOTE — Discharge Summary (Signed)
PATIENT DETAILS Name: Alyssa Knight Age: 80 y.o. Sex: female Date of Birth: 1917/03/12 MRN: [id number]. Admit Date: 05/14/2014 Admitting Physician: Leroy Sea, MD WUJ:WJXBJYN,WGNFAO R, MD  Recommendations for Outpatient Follow-up:  1. Needs Palliative care followup while at skilled nursing facility.May need to transition to residential hospice at some point. 2. Slowly optimize dosing of Ativan and Roxanol. 3. MOST form completed-please see paper chart.  PRIMARY DISCHARGE DIAGNOSIS:  Principal Problem:   Gangrene of toe Active Problems:   HTN (hypertension)   CKD (chronic kidney disease) stage 4, GFR 15-29 ml/min   Diabetes type 2, controlled   Chronic systolic heart failure   Atherosclerosis of native arteries of the extremities with gangrene   ARF (acute renal failure)   Sepsis      PAST MEDICAL HISTORY: Past Medical History  Diagnosis Date  . Diabetes mellitus without complication   . HTN (hypertension)   . CKD (chronic kidney disease)   . Systolic CHF   . CHF (congestive heart failure)   . COPD (chronic obstructive pulmonary disease)   . Peripheral vascular disease     DISCHARGE MEDICATIONS:   Medication List    STOP taking these medications       cephALEXin 500 MG capsule  Commonly known as:  KEFLEX     LASIX 20 MG tablet  Generic drug:  furosemide     sulfamethoxazole-trimethoprim 800-160 MG per tablet  Commonly known as:  SEPTRA DS      TAKE these medications       acetaminophen 650 MG CR tablet  Commonly known as:  TYLENOL  Take 650 mg by mouth every 6 (six) hours as needed for pain.     aspirin EC 81 MG tablet  Take 81 mg by mouth daily.     feeding supplement (PRO-STAT SUGAR FREE 64) Liqd  Take 30 mLs by mouth every morning.     LORazepam 1 MG tablet  Commonly known as:  ATIVAN  Take 1 tablet (1 mg total) by mouth daily after breakfast.     LORazepam 1 MG tablet  Commonly known as:  ATIVAN  Take 1 tablet (1 mg total) by mouth  every 6 (six) hours as needed for anxiety.     morphine CONCENTRATE 10 mg / 0.5 ml concentrated solution  Take 0.25 mLs (5 mg total) by mouth every 4 (four) hours as needed for severe pain, anxiety or shortness of breath.     morphine CONCENTRATE 10 mg / 0.5 ml concentrated solution  Take 0.25 mLs (5 mg total) by mouth at bedtime.     ondansetron 4 MG tablet  Commonly known as:  ZOFRAN  Take 1 tablet (4 mg total) by mouth every 6 (six) hours as needed for nausea.     polyethylene glycol packet  Commonly known as:  MIRALAX / GLYCOLAX  Take 17 g by mouth daily.        ALLERGIES:   Allergies  Allergen Reactions  . Fish Allergy Shortness Of Breath  . Actos [Pioglitazone] Other (See Comments)    May have dropped blood sugar too much-family unsure    BRIEF HPI:  See H&P, Labs, Consult and Test reports for all details in brief, patient is a 80 year old female current resident of a skilled nursing facility, history of severe chronic systolic heart failure with an EF around 15%, chronic kidney disease stage III, known peripheral vascular disease with gangrene who was admitted to the hospital on 7/19 for hypertension and dehydration.  CONSULTATIONS:   None  PERTINENT RADIOLOGIC STUDIES: Dg Chest 2 View  05/14/2014   CLINICAL DATA:  Hypoxia  EXAM: CHEST  2 VIEW  COMPARISON:  02/12/2014 and multiple previous  FINDINGS: Background pattern of extensive chronic pleural calcification on the right with a small right hemithorax. Further enlargement of the cardiac silhouette. Interstitial and alveolar pulmonary edema consistent with congestive heart failure. Probable pleural effusion on the left. Volume loss of the left lower lobe.  IMPRESSION: Further enlargement of the cardiac silhouette. Interstitial and alveolar edema, all consistent with congestive heart failure. Pleural effusion on the left with collapse of left lower lobe. Chronic pleural calcification on the right with a small right  hemithorax.   Electronically Signed   By: Paulina FusiMark  Shogry M.D.   On: 05/14/2014 08:48   Ct Head Wo Contrast  05/08/2014   CLINICAL DATA:  General weakness.  EXAM: CT HEAD WITHOUT CONTRAST  TECHNIQUE: Contiguous axial images were obtained from the base of the skull through the vertex without intravenous contrast.  COMPARISON:  None.  FINDINGS: Diffuse prominence of the CSF containing spaces is compatible with generalized cerebral atrophy. Scattered and confluent hypodensity within the periventricular and deep white matter both cerebral hemispheres is most consistent with moderate small vessel ischemic changes. Prominent vascular calcifications present within the carotid siphons bilaterally.  There is no acute intracranial hemorrhage or infarct. No mass lesion or midline shift. Gray-white matter differentiation is well maintained. Ventricles are normal in size without evidence of hydrocephalus. CSF containing spaces are within normal limits. No extra-axial fluid collection.  The calvarium is intact.  Orbital soft tissues are within normal limits.  The paranasal sinuses and mastoid air cells are well pneumatized and free of fluid.  Scalp soft tissues are unremarkable.  IMPRESSION: 1. No acute intracranial abnormality. 2. Generalized cerebral atrophy with moderate chronic microvascular ischemic disease.   Electronically Signed   By: Rise MuBenjamin  McClintock M.D.   On: 05/08/2014 23:06   Dg Toe Great Left  05/09/2014   CLINICAL DATA:  Wound infection.  Weakness.  EXAM: LEFT GREAT TOE  COMPARISON:  11/18/2013  FINDINGS: Large ulcer involving the tip of the great toe. The tuft of the great toe appears exposed to air. No fragmentation or erosion. No joint narrowing, fracture, or dislocation. There are soft tissue and vascular calcifications of the great toe.  IMPRESSION: Large soft tissue ulcer of the great toe, with possible distal phalanx exposure. No changes of osteomyelitis.   Electronically Signed   By: Tiburcio PeaJonathan  Watts  M.D.   On: 05/09/2014 01:11     PERTINENT LAB RESULTS: CBC:  Recent Labs  05/14/14 0755 05/14/14 0801 05/15/14 0524  WBC 12.3*  --  12.8*  HGB 9.0* 10.9* 8.9*  HCT 27.8* 32.0* 27.2*  PLT 190  --  180   CMET CMP     Component Value Date/Time   NA 135* 05/16/2014 0510   K 4.6 05/16/2014 0510   CL 95* 05/16/2014 0510   CO2 18* 05/16/2014 0510   GLUCOSE 65* 05/16/2014 0510   BUN 136* 05/16/2014 0510   CREATININE 4.54* 05/16/2014 0510   CALCIUM 8.2* 05/16/2014 0510   PROT 6.7 05/16/2014 0510   ALBUMIN 2.7* 05/16/2014 0510   AST 823* 05/16/2014 0510   ALT 388* 05/16/2014 0510   ALKPHOS 167* 05/16/2014 0510   BILITOT 0.6 05/16/2014 0510   GFRNONAA 7* 05/16/2014 0510   GFRAA 9* 05/16/2014 0510    GFR Estimated Creatinine Clearance: 7.1 ml/min (by C-G formula  based on Cr of 4.54). No results found for this basename: LIPASE, AMYLASE,  in the last 72 hours No results found for this basename: CKTOTAL, CKMB, CKMBINDEX, TROPONINI,  in the last 72 hours No components found with this basename: POCBNP,  No results found for this basename: DDIMER,  in the last 72 hours No results found for this basename: HGBA1C,  in the last 72 hours No results found for this basename: CHOL, HDL, LDLCALC, TRIG, CHOLHDL, LDLDIRECT,  in the last 72 hours No results found for this basename: TSH, T4TOTAL, FREET3, T3FREE, THYROIDAB,  in the last 72 hours No results found for this basename: VITAMINB12, FOLATE, FERRITIN, TIBC, IRON, RETICCTPCT,  in the last 72 hours Coags: No results found for this basename: PT, INR,  in the last 72 hours Microbiology: Recent Results (from the past 240 hour(s))  URINE CULTURE     Status: None   Collection Time    05/14/14  9:24 AM      Result Value Ref Range Status   Specimen Description URINE, CATHETERIZED   Final   Special Requests PATIENT ON FOLLOWING BACTRIM,KEFLEX   Final   Culture  Setup Time     Final   Value: 05/14/2014 19:32     Performed at [facility]     Final   Value: NO GROWTH     Performed at [facility]   Culture     Final   Value: NO GROWTH     Performed at [facility]   Report Status 05/15/2014 FINAL   Final  CULTURE, BLOOD (ROUTINE X 2)     Status: None   Collection Time    05/14/14 10:53 AM      Result Value Ref Range Status   Specimen Description BLOOD RIGHT ARM   Final   Special Requests BOTTLES DRAWN AEROBIC AND ANAEROBIC 10CC   Final   Culture  Setup Time     Final   Value: 05/14/2014 18:45     Performed at [facility]   Culture     Final   Value:        BLOOD CULTURE RECEIVED NO GROWTH TO DATE CULTURE WILL BE HELD FOR 5 DAYS BEFORE ISSUING A FINAL NEGATIVE REPORT     Performed at [facility]   Report Status PENDING   Incomplete  CULTURE, BLOOD (ROUTINE X 2)     Status: None   Collection Time    05/14/14 11:00 AM      Result Value Ref Range Status   Specimen Description BLOOD RIGHT HAND   Final   Special Requests BOTTLES DRAWN AEROBIC ONLY 10CC   Final   Culture  Setup Time     Final   Value: 05/14/2014 18:45     Performed at [facility]   Culture     Final   Value:        BLOOD CULTURE RECEIVED NO GROWTH TO DATE CULTURE WILL BE HELD FOR 5 DAYS BEFORE ISSUING A FINAL NEGATIVE REPORT     Performed at [facility]   Report Status PENDING   Incomplete  MRSA PCR SCREENING     Status: None   Collection Time    05/14/14 11:37 AM      Result Value Ref Range Status   MRSA by PCR NEGATIVE  NEGATIVE Final   Comment:            The GeneXpert MRSA  Assay (FDA     approved for NASAL specimens     only), is one component of a     comprehensive MRSA colonization     surveillance program. It is not     intended to diagnose MRSA     infection nor to guide or     monitor treatment for     MRSA infections.     BRIEF HOSPITAL COURSE:   Principal Problem: Suspected sepsis - Initially suspected to have UTI, and possibly worsening of her known gangrene in  her foot bilaterally. Empirically started on vancomycin and Zosyn, and is obtained, and they were negative. Very poor overall prognosis, clearly deteriorating, family does not want any aggressive measures, since all cultures are negative, half stop antibiotics, transitioned to comfort measures. Will need palliative care followup at skilled nursing facility.  Active Problems: Suspected UTI - On admission, however and Zosyn. Urine cultures negative. Zosyn subsequently discontinued. - Family aware of a poor overall prognosis, they do not want any further antibiotic therapy-MOST form completed.  PAD with bilateral first toe gangrene:  - Conservative tx with IV abx  - Family doesn't want amputation -wound care involved  ARF on CKD Stage IV: - Suspect cardiorenal syndrome. - Not a candidate for dialysis, family also does not desire dialysis. - Transitioned to comfort care  Transaminitis  -? Congestive hepatitis- given history of severe systolic heart failure.  -repeat LFT's this am- shows continued worsening. Chronic Systolic HF:  - bilateral pleural effusions  - lasix being held  Chronic Systolic HF:  - bilateral pleural effusions  - lasix being held-due to worsening renal function  -Chronic Systolic HF:  - bilateral pleural effusions  - lasix being held  Ethics: Long discussion with son/daughter-in-law on 7/20 and 7/21. Following a goals of care: - DO NOT RESUSCITATE - Transitioned to comfort care measures, and do not rehospitalize unless comfort care measures cannot be done at skilled nursing facility. - No further antibiotics, family open to transient IV fluids if at all. - No artificial feeding now in the future - Start on when necessary morphine/Ativan, and also on scheduled morphine and Ativan. - May require transition to a residential hospice at some point. Family would like palliative care services to follow at skilled nursing facility  TODAY-DAY OF  DISCHARGE:  Subjective:   Alyssa Knight continues to do well, no acute complaints.  Objective:   Blood pressure 104/60, pulse 87, temperature 97.6 F (36.4 C), temperature source Oral, resp. rate 16, height 5\' 7"  (1.702 m), weight 60.5 kg (133 lb 6.1 oz), SpO2 90.00%.  Intake/Output Summary (Last 24 hours) at 05/16/14 1036 Last data filed at 05/16/14 0925  Gross per 24 hour  Intake 868.67 ml  Output      0 ml  Net 868.67 ml   Filed Weights   05/14/14 1145 05/15/14 0500 05/16/14 0113  Weight: 60.6 kg (133 lb 9.6 oz) 60.011 kg (132 lb 4.8 oz) 60.5 kg (133 lb 6.1 oz)    Exam Awake Alert, and mostly Oriented *3, No new F.N deficits, Normal affect Hawkins.AT,PERRAL Supple Neck,+ JVD, No cervical lymphadenopathy appriciated.  Symmetrical Chest wall movement, Good air movement bilaterally, CTAB RRR,No Gallops,Rubs or new Murmurs, No Parasternal Heave +ve B.Sounds, Abd Soft, Non tender, No organomegaly appriciated, No rebound -guarding or rigidity. No Cyanosis, Clubbing or edema, No new Rash or bruise  DISCHARGE CONDITION: Stable  DISPOSITION: SNF with palliative care services  DISCHARGE INSTRUCTIONS:    Activity:  As tolerated  with Full fall precautions use walker/cane & assistance as needed  MOST form completed-please see MOST form in the paper chart  CODE STATUS: DNR  Diet recommendation: Heart Healthy diet Fluid restriction 1.2lit/day Aspiration precautions:yes      Discharge Instructions   (HEART FAILURE PATIENTS) Call MD:  Anytime you have any of the following symptoms: 1) 3 pound weight gain in 24 hours or 5 pounds in 1 week 2) shortness of breath, with or without a dry hacking cough 3) swelling in the hands, feet or stomach 4) if you have to sleep on extra pillows at night in order to breathe.    Complete by:  As directed      Diet - low sodium heart healthy    Complete by:  As directed      Increase activity slowly    Complete by:  As directed             Follow-up Information   Follow up with Thora Lance, MD. (As needed)    Specialty:  Family Medicine   Contact information:   [address], [address] [place] [address] [phone]       Total Time spent on discharge equals 45 minutes.  SignedJeoffrey Massed 05/16/2014 10:36 AM  **Disclaimer: This note may have been dictated with voice recognition software. Similar sounding words can inadvertently be transcribed and this note may contain transcription errors which may not have been corrected upon publication of note.**

## 2014-05-18 ENCOUNTER — Non-Acute Institutional Stay (SKILLED_NURSING_FACILITY): Payer: Medicare Other | Admitting: Internal Medicine

## 2014-05-18 ENCOUNTER — Encounter: Payer: Self-pay | Admitting: Internal Medicine

## 2014-05-18 DIAGNOSIS — Z66 Do not resuscitate: Secondary | ICD-10-CM | POA: Insufficient documentation

## 2014-05-18 DIAGNOSIS — I5022 Chronic systolic (congestive) heart failure: Secondary | ICD-10-CM

## 2014-05-18 DIAGNOSIS — R7401 Elevation of levels of liver transaminase levels: Secondary | ICD-10-CM

## 2014-05-18 DIAGNOSIS — N179 Acute kidney failure, unspecified: Secondary | ICD-10-CM

## 2014-05-18 DIAGNOSIS — N3000 Acute cystitis without hematuria: Secondary | ICD-10-CM

## 2014-05-18 DIAGNOSIS — R7402 Elevation of levels of lactic acid dehydrogenase (LDH): Secondary | ICD-10-CM

## 2014-05-18 DIAGNOSIS — N390 Urinary tract infection, site not specified: Secondary | ICD-10-CM | POA: Insufficient documentation

## 2014-05-18 DIAGNOSIS — I96 Gangrene, not elsewhere classified: Secondary | ICD-10-CM

## 2014-05-18 DIAGNOSIS — R740 Nonspecific elevation of levels of transaminase and lactic acid dehydrogenase [LDH]: Secondary | ICD-10-CM

## 2014-05-18 NOTE — Assessment & Plan Note (Signed)
Suspect cardiorenal syndrome.  - Not a candidate for dialysis, family also does not desire dialysis.  - Transitioned to comfort care

## 2014-05-18 NOTE — Assessment & Plan Note (Signed)
Conservative tx with IV abx  - Family doesn't want amputation  -wound care involved

## 2014-05-18 NOTE — Assessment & Plan Note (Signed)
Long discussion with son/daughter-in-law on 7/20 and 7/21. Following a goals of care:  - DO NOT RESUSCITATE  - Transitioned to comfort care measures, and do not rehospitalize unless comfort care measures cannot be done at skilled nursing facility.  - No further antibiotics, family open to transient IV fluids if at all.  - No artificial feeding now in the future  - Start on when necessary morphine/Ativan, and also on scheduled morphine and Ativan.  - May require transition to a residential hospice at some point. Family would like palliative care services to follow at skilled nursing facility

## 2014-05-18 NOTE — Progress Notes (Signed)
MRN: [id number] Name: Alyssa Knight  Sex: female Age: 80 y.o. DOB: May 11, 1917  [facility] #: [facility] Facility/Room: [address]b Level Of Care: SNF Provider: Merrilee Seashore D Emergency Contacts: Extended Emergency Contact Information Primary Emergency Contact: Klecka,Gary Address: [address]          [place], [place] [address] [place] Home Phone: [phone] Mobile Phone: [phone] Relation: Son Secondary Emergency Contact: Woolstenhulme,Eva Address: [address]          [place], [place] [address] [place] Home Phone: [phone] Relation: Daughter  Code Status: DNR  Allergies: Fish allergy and Actos  Chief Complaint  Patient presents with  . nursing home admission    HPI: Patient is 80 y.o. female who was admitted for rapid decline after hospitalization for UTI and gangrene of toe. Pt is in process of dying now.  Past Medical History  Diagnosis Date  . Diabetes mellitus without complication   . HTN (hypertension)   . CKD (chronic kidney disease)   . Systolic CHF   . CHF (congestive heart failure)   . COPD (chronic obstructive pulmonary disease)   . Peripheral vascular disease     Past Surgical History  Procedure Laterality Date  . Femur fracture surgery Left 03/2013    L distal femur lateral condyle s/p ORIF  . Orif femur fracture Left 04/11/2013    Procedure: OPEN REDUCTION INTERNAL FIXATION (ORIF) DISTAL FEMUR FRACTURE- left;  Surgeon: Senaida Lange, MD;  Location: [facility] OR;  Service: Orthopedics;  Laterality: Left;      Medication List       This list is accurate as of: 06-01-14  3:40 PM.  Always use your most recent med list.               acetaminophen 650 MG CR tablet  Commonly known as:  TYLENOL  Take 650 mg by mouth every 6 (six) hours as needed for pain.     aspirin EC 81 MG tablet  Take 81 mg by mouth daily.     feeding supplement (PRO-STAT SUGAR FREE 64) Liqd  Take 30 mLs by mouth every morning.     LORazepam 1 MG  tablet  Commonly known as:  ATIVAN  Take 1 tablet (1 mg total) by mouth daily after breakfast.     LORazepam 1 MG tablet  Commonly known as:  ATIVAN  Take 1 tablet (1 mg total) by mouth every 6 (six) hours as needed for anxiety.     morphine CONCENTRATE 10 mg / 0.5 ml concentrated solution  Take 0.25 mLs (5 mg total) by mouth every 4 (four) hours as needed for severe pain, anxiety or shortness of breath.     morphine CONCENTRATE 10 mg / 0.5 ml concentrated solution  Take 0.25 mLs (5 mg total) by mouth at bedtime.     ondansetron 4 MG tablet  Commonly known as:  ZOFRAN  Take 1 tablet (4 mg total) by mouth every 6 (six) hours as needed for nausea.     polyethylene glycol packet  Commonly known as:  MIRALAX / GLYCOLAX  Take 17 g by mouth daily.        No orders of the defined types were placed in this encounter.    There is no immunization history for the selected administration types on file for this patient.  History  Substance Use Topics  . Smoking status: Never Smoker   . Smokeless tobacco: Never Used  . Alcohol Use: No  Family history is noncontributory    Review of Systems  - not doing well, but expected     Filed Vitals:   2014/07/31 1429  BP: 118/59  Pulse: 72  Temp: 96.3 F (35.7 C)  Resp: 18    Physical Exam  GENERAL APPEARANCE:  Appropriately groomed  SKIN:  rash HEAD: Normocephalic, atraumatic  EYES: Conjunctiva/lids clear. Pupils round, reactive non reactive  EARS: External exam WNL, canals clear.  NOSE: No deformity or discharge.  MOUTH/THROAT: Lips w/o lesions  RESPIRATORY: no breathing  CARDIOVASCULAR: no heart tones; no edema.  VENOUS: No varicosities. No venous stasis skin changes  GASTROINTESTINAL: Abdomen is soft,, not distended  GENITOURINARY: Bladder  not distended  MUSCULOSKELETAL: No abnormal joints or musculature NEUROLOGIC: expired  Per family at bedside pt had cheyne stokes breathing which became more calm then stopped just  moments before my arrival at the bedside  Patient Active Problem List   Diagnosis Date Noted  . UTI (urinary tract infection) 07/25/2014  . Transaminitis 07/25/2014  . DNR (do not resuscitate) 07/25/2014  . ARF (acute renal failure) 05/14/2014  . Sepsis 05/14/2014  . Acute on chronic systolic heart failure 03/02/2014  . Gout 02/24/2014  . Dilated cardiomyopathy 02/21/2014  . CHF (congestive heart failure) 02/12/2014  . Gangrene of toe 02/12/2014  . Atherosclerosis of native arteries of the extremities with gangrene 11/24/2013  . Pleural effusion, left 04/13/2013  . Acute respiratory failure with hypoxia 04/11/2013  . Chronic systolic heart failure 04/11/2013  . HTN (hypertension) 04/10/2013  . Femur fracture, left 04/10/2013  . CKD (chronic kidney disease) stage 4, GFR 15-29 ml/min 04/10/2013  . Diabetes type 2, controlled 04/10/2013    CBC    Component Value Date/Time   WBC 12.8* 05/15/2014 0524   RBC 3.16* 05/15/2014 0524   RBC 2.83* 04/14/2013 2100   HGB 8.9* 05/15/2014 0524   HCT 27.2* 05/15/2014 0524   PLT 180 05/15/2014 0524   MCV 86.1 05/15/2014 0524   LYMPHSABS 0.6* 05/14/2014 0755   MONOABS 0.6 05/14/2014 0755   EOSABS 0.1 05/14/2014 0755   BASOSABS 0.0 05/14/2014 0755    CMP     Component Value Date/Time   NA 135* 05/16/2014 0510   K 4.6 05/16/2014 0510   CL 95* 05/16/2014 0510   CO2 18* 05/16/2014 0510   GLUCOSE 65* 05/16/2014 0510   BUN 136* 05/16/2014 0510   CREATININE 4.54* 05/16/2014 0510   CALCIUM 8.2* 05/16/2014 0510   PROT 6.7 05/16/2014 0510   ALBUMIN 2.7* 05/16/2014 0510   AST 823* 05/16/2014 0510   ALT 388* 05/16/2014 0510   ALKPHOS 167* 05/16/2014 0510   BILITOT 0.6 05/16/2014 0510   GFRNONAA 7* 05/16/2014 0510   GFRAA 9* 05/16/2014 0510    Assessment and Plan  UTI (urinary tract infection) On admission, however and Zosyn. Urine cultures negative. Zosyn subsequently discontinued.  - Family aware of a poor overall prognosis, they do not want any further  antibiotic therapy-MOST form completed   Gangrene of toe Conservative tx with IV abx  - Family doesn't want amputation  -wound care involved   ARF (acute renal failure) Suspect cardiorenal syndrome.  - Not a candidate for dialysis, family also does not desire dialysis.  - Transitioned to comfort care   Transaminitis -? Congestive hepatitis- given history of severe systolic heart failure.  -repeat LFT's this am- shows continued worsening. Chronic Systolic HF:  - bilateral pleural effusions  - lasix being held   Chronic systolic heart failure  bilateral pleural effusions  - lasix being held-due to worsening renal function   DNR (do not resuscitate) Long discussion with son/daughter-in-law on 7/20 and 7/21. Following a goals of care:  - DO NOT RESUSCITATE  - Transitioned to comfort care measures, and do not rehospitalize unless comfort care measures cannot be done at skilled nursing facility.  - No further antibiotics, family open to transient IV fluids if at all.  - No artificial feeding now in the future  - Start on when necessary morphine/Ativan, and also on scheduled morphine and Ativan.  - May require transition to a residential hospice at some point. Family would like palliative care services to follow at skilled nursing facility      PT HAS EXPIRED PEACEFULLY WITH HER FAMILY AT BEDSIDE -spoke with Granddaughter and another family member and they had no questions or concerns  Margit Hanks, MD

## 2014-05-18 NOTE — Assessment & Plan Note (Signed)
bilateral pleural effusions  - lasix being held-due to worsening renal function

## 2014-05-18 NOTE — Assessment & Plan Note (Signed)
-?   Congestive hepatitis- given history of severe systolic heart failure.  -repeat LFT's this am- shows continued worsening. Chronic Systolic HF:  - bilateral pleural effusions  - lasix being held

## 2014-05-18 NOTE — Assessment & Plan Note (Signed)
On admission, however and Zosyn. Urine cultures negative. Zosyn subsequently discontinued.  - Family aware of a poor overall prognosis, they do not want any further antibiotic therapy-MOST form completed

## 2014-05-20 LAB — CULTURE, BLOOD (ROUTINE X 2)
CULTURE: NO GROWTH
Culture: NO GROWTH

## 2014-05-27 DIAGNOSIS — 419620001 Death: Secondary | SNOMED CT

## 2014-05-27 DEATH — deceased

## 2014-06-14 ENCOUNTER — Encounter: Payer: Self-pay | Admitting: Vascular Surgery

## 2014-06-15 ENCOUNTER — Ambulatory Visit: Payer: Medicare Other | Admitting: Vascular Surgery

## 2014-07-28 IMAGING — CT CT HEAD W/O CM
1 series · 15 of 30 positions shown, 19 images · non-contrast
Comparison: None.

CLINICAL DATA: General weakness.

EXAM:
CT HEAD WITHOUT CONTRAST
TECHNIQUE: Contiguous axial images were obtained from the base of the skull
through the vertex without intravenous contrast.

[Series 2: head 5.0 h30s · axial · 0.45mm/px · z∈[-124,+16]mm · 15 of 32 slices shown, 19 images]
[im 2/32  brain]
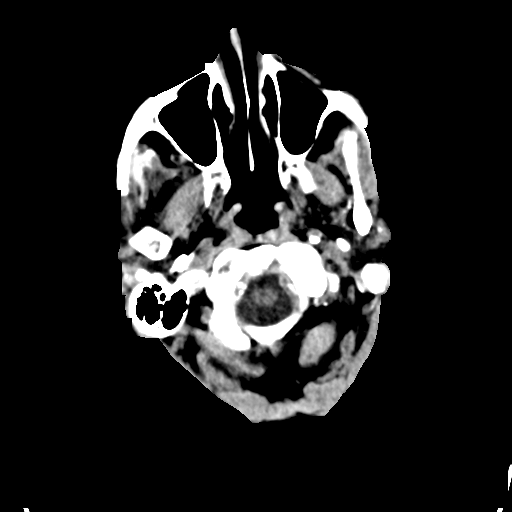
[im 2/32  bone]
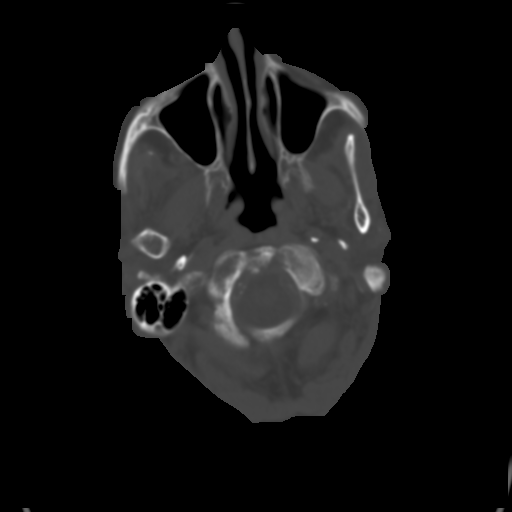
[im 4/32  brain]
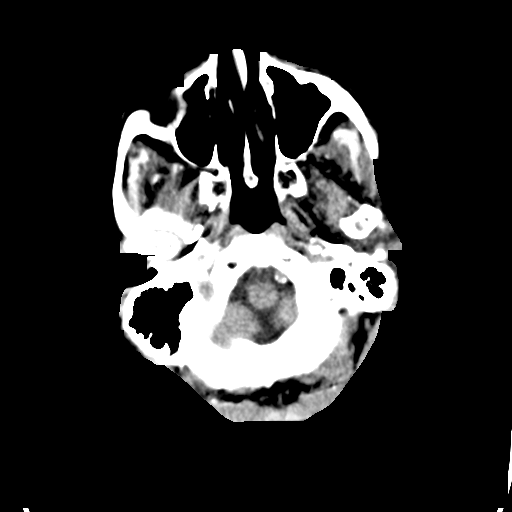
[im 6/32  brain]
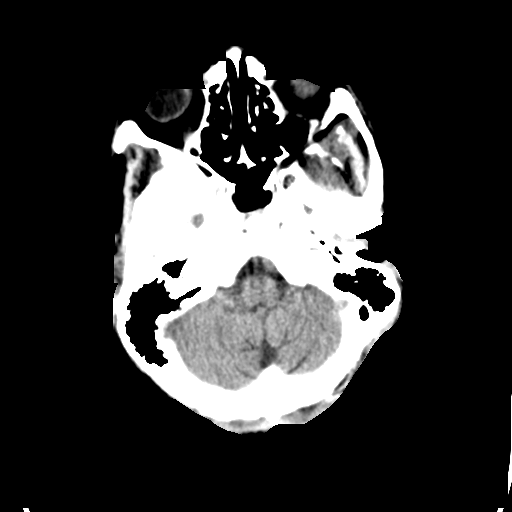
[im 8/32  brain]
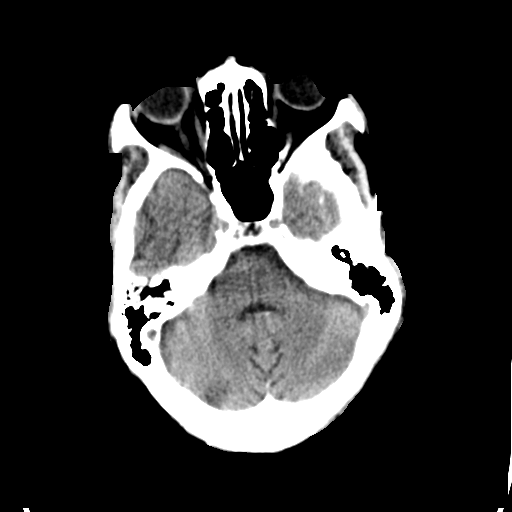
[im 10/32  brain]
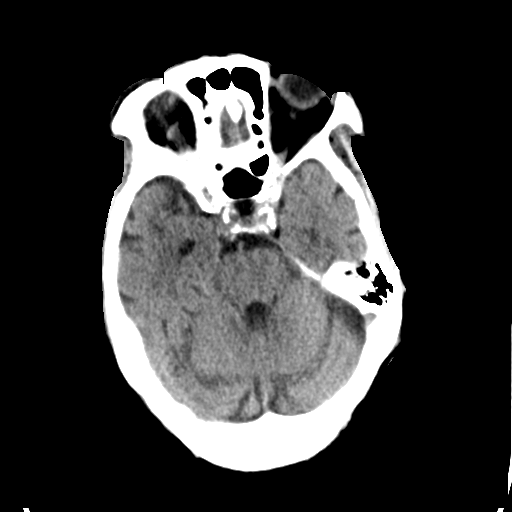
[im 10/32  bone]
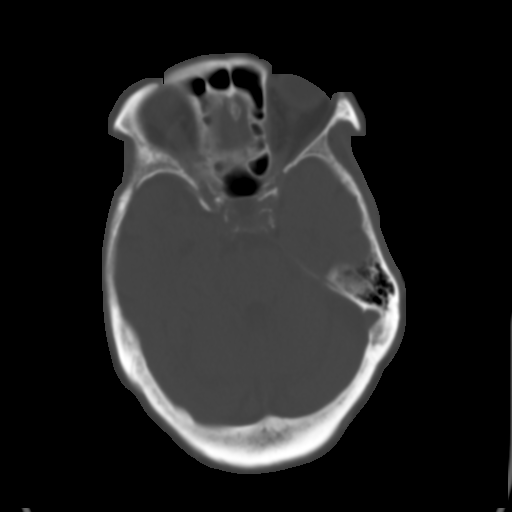
[im 12/32  brain]
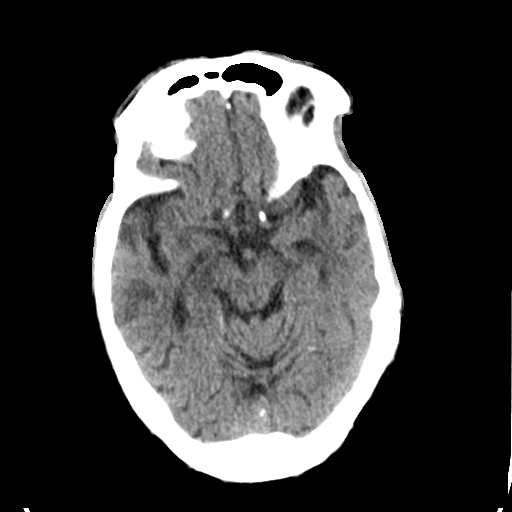
[im 14/32  brain]
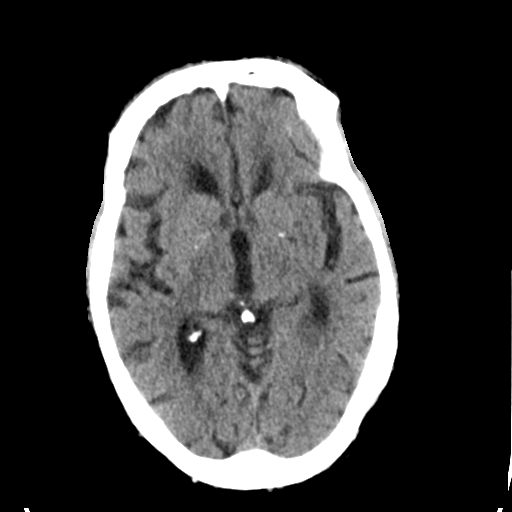
[im 17/32  brain]
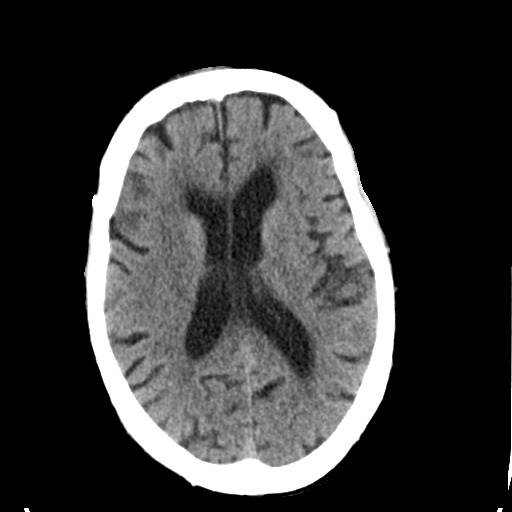
[im 18/32  brain]
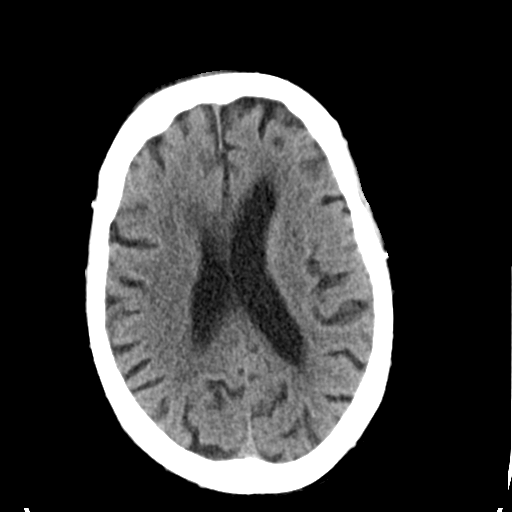
[im 18/32  bone]
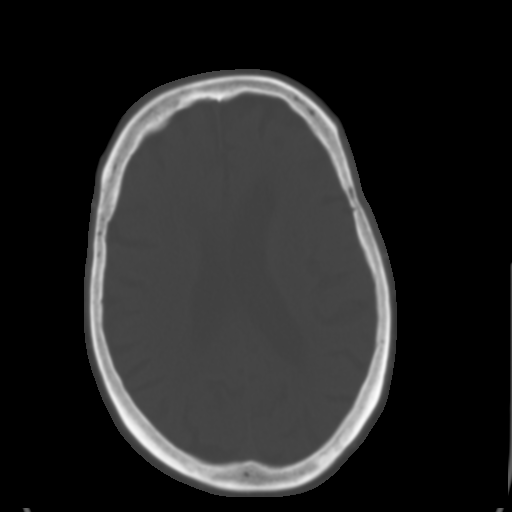
[im 20/32  brain]
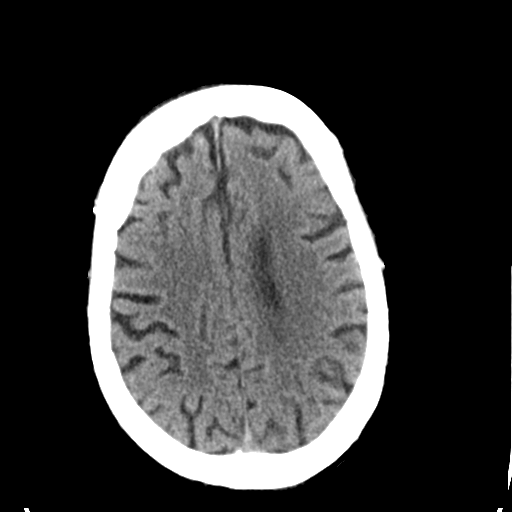
[im 22/32  brain]
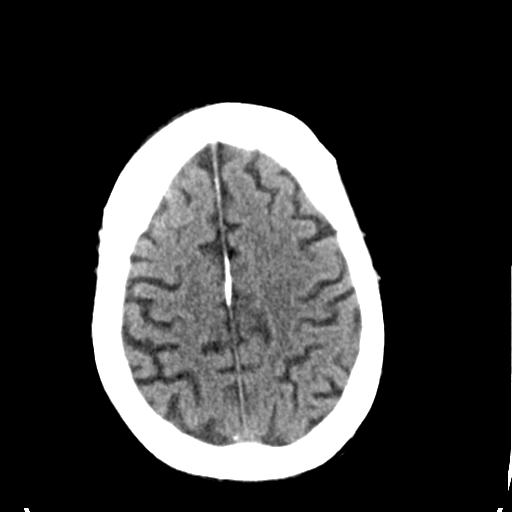
[im 24/32  brain]
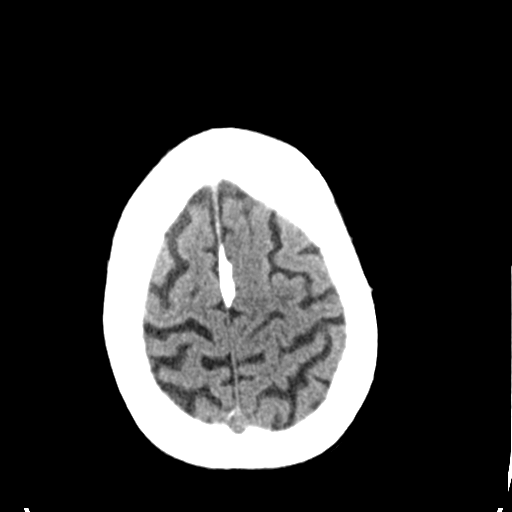
[im 26/32  brain]
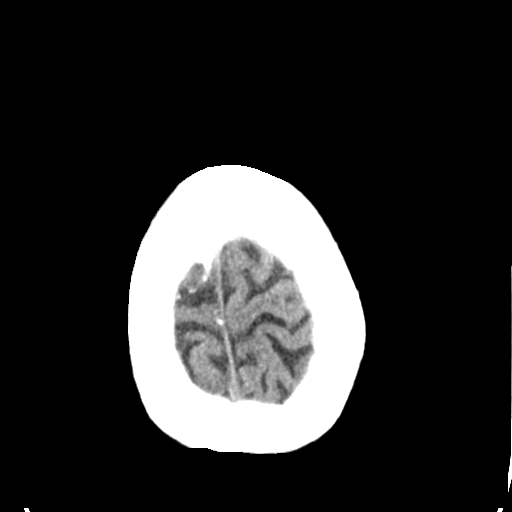
[im 26/32  bone]
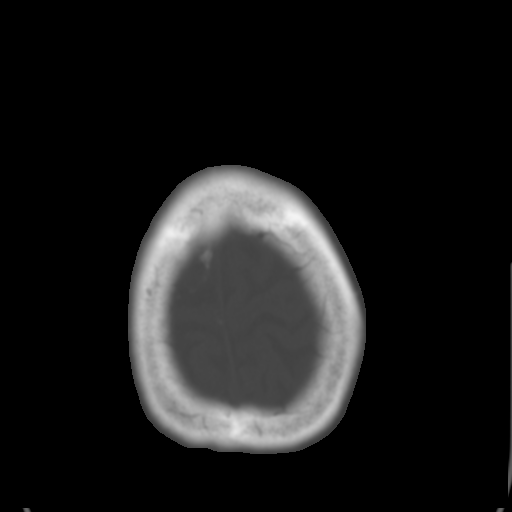
[im 28/32  brain]
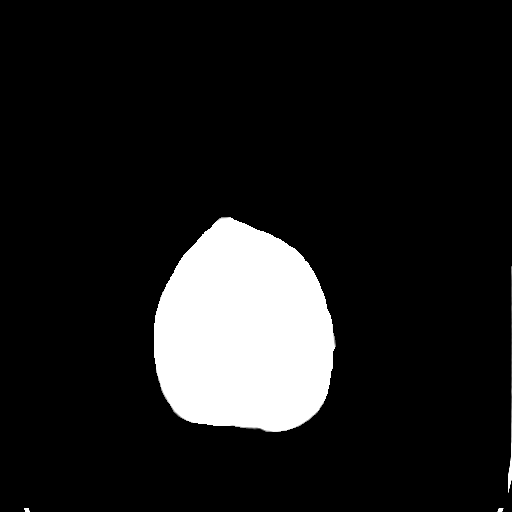
[im 30/32  brain]
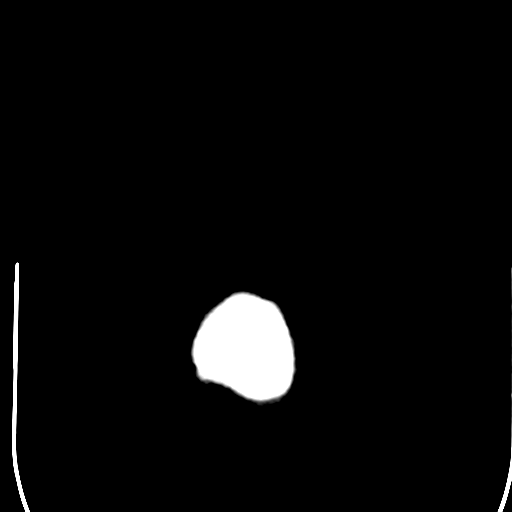

[15 of 30 positions shown; findings below may reference images not displayed]

FINDINGS: Diffuse prominence of the CSF containing spaces is compatible with
generalized cerebral atrophy. Scattered and confluent hypodensity
within the periventricular and deep white matter both cerebral
hemispheres is most consistent with moderate small vessel ischemic
changes. Prominent vascular calcifications present within the
carotid siphons bilaterally.

There is no acute intracranial hemorrhage or infarct. No mass lesion
or midline shift. Gray-white matter differentiation is well
maintained. Ventricles are normal in size without evidence of
hydrocephalus. CSF containing spaces are within normal limits. No
extra-axial fluid collection.

The calvarium is intact.

Orbital soft tissues are within normal limits.

The paranasal sinuses and mastoid air cells are well pneumatized and
free of fluid.

Scalp soft tissues are unremarkable.
IMPRESSION: 1. No acute intracranial abnormality.
2. Generalized cerebral atrophy with moderate chronic microvascular
ischemic disease.

## 2014-07-29 IMAGING — CR DG TOE GREAT 2+V*L*
3 series · 3 of 3 positions shown · non-contrast
Comparison: 11/18/2013

CLINICAL DATA: Wound infection.  Weakness.

EXAM:
LEFT GREAT TOE

[x toes ap left]
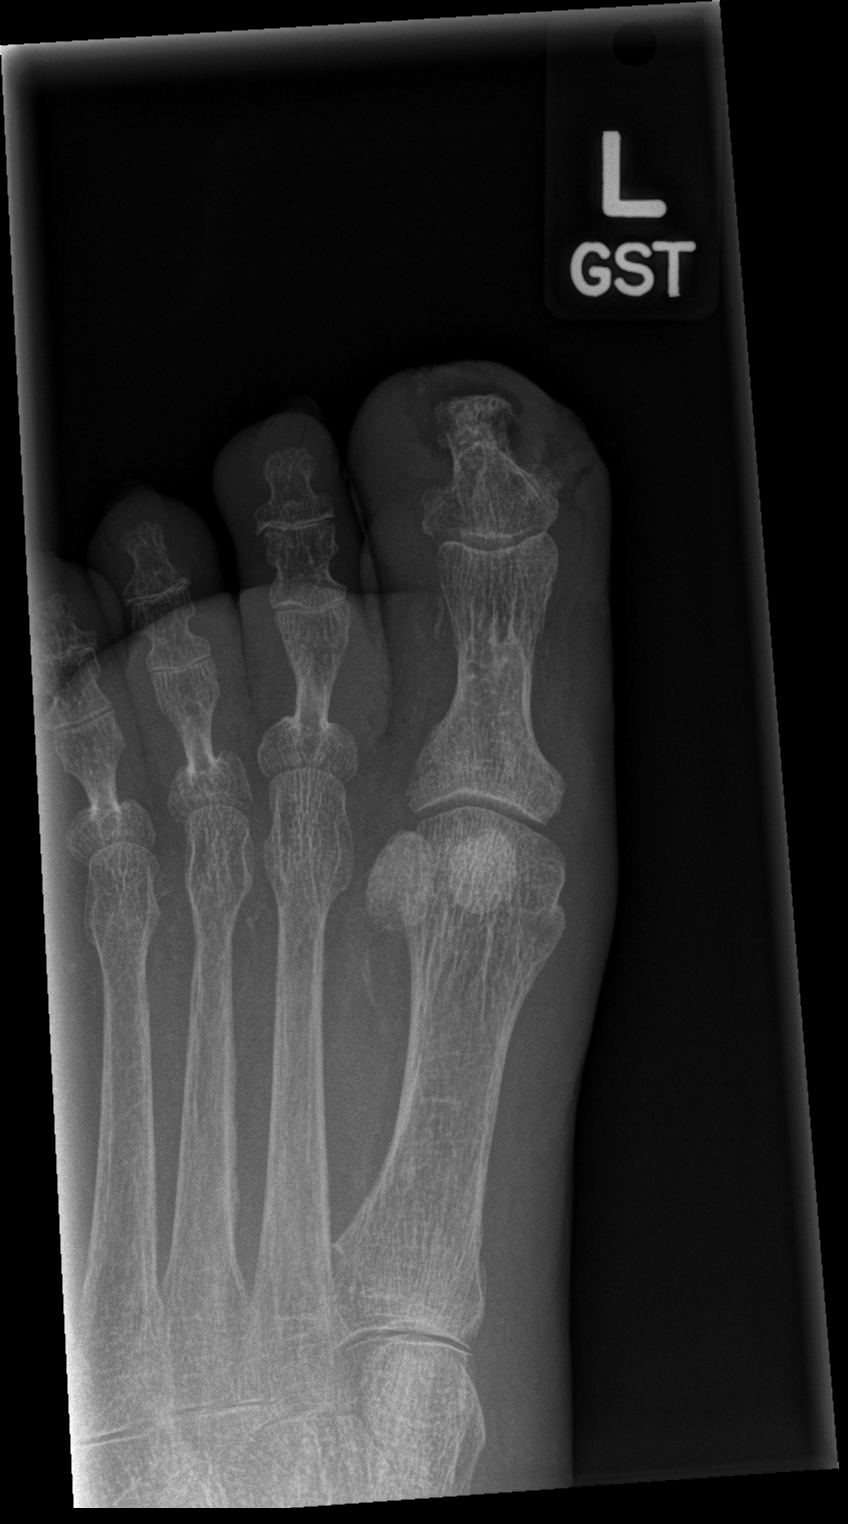

[x toes obl left]
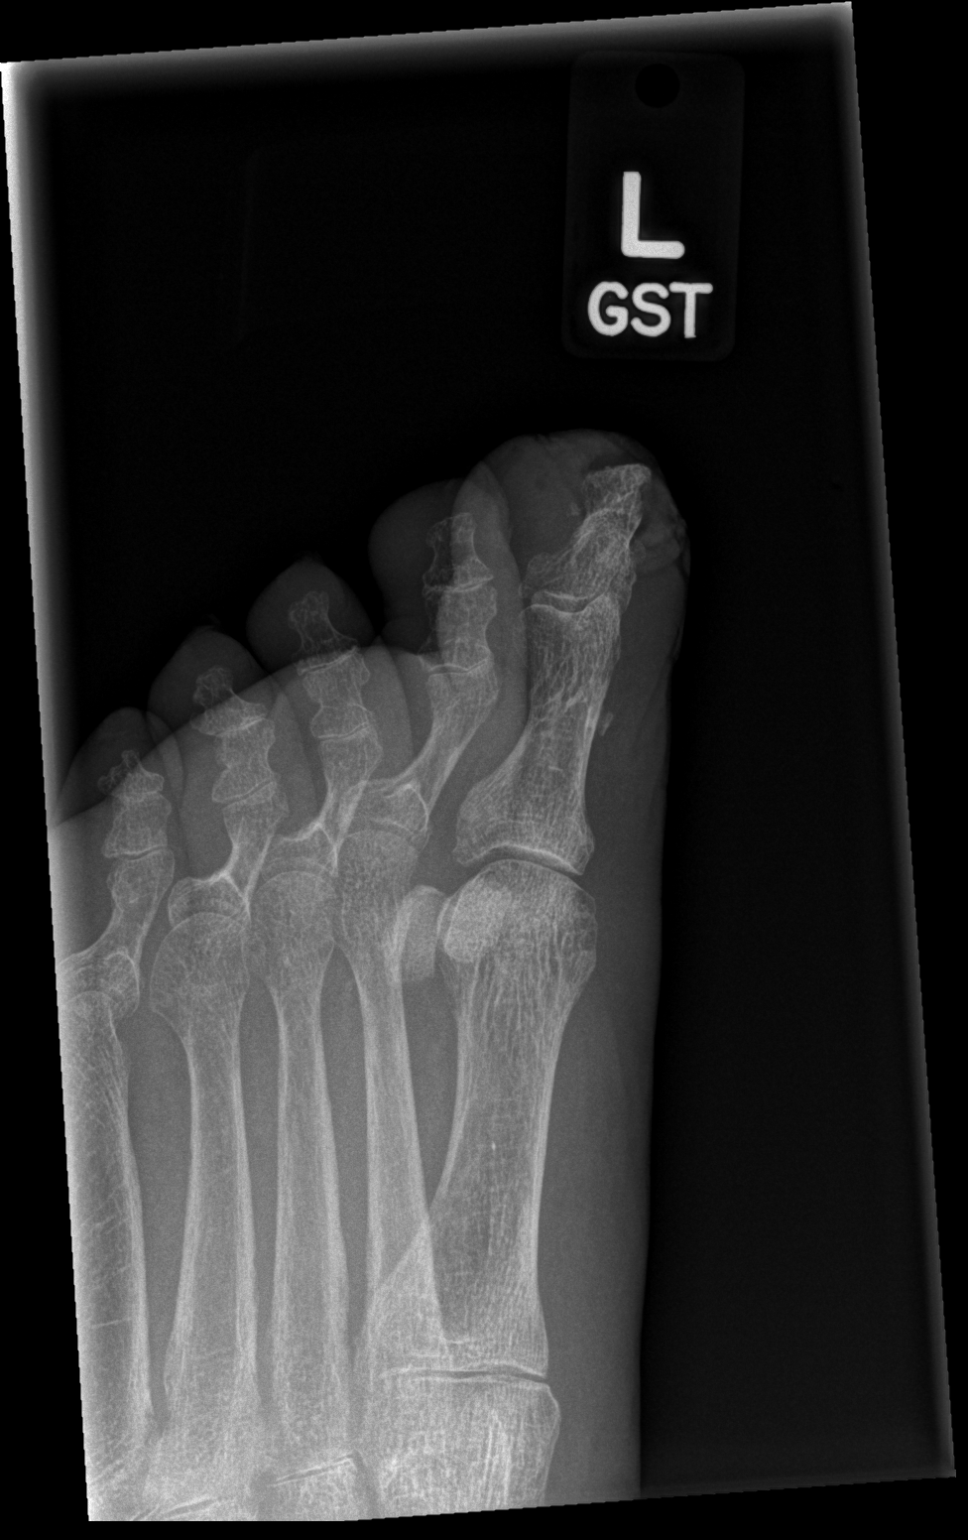

[x toes lat left]
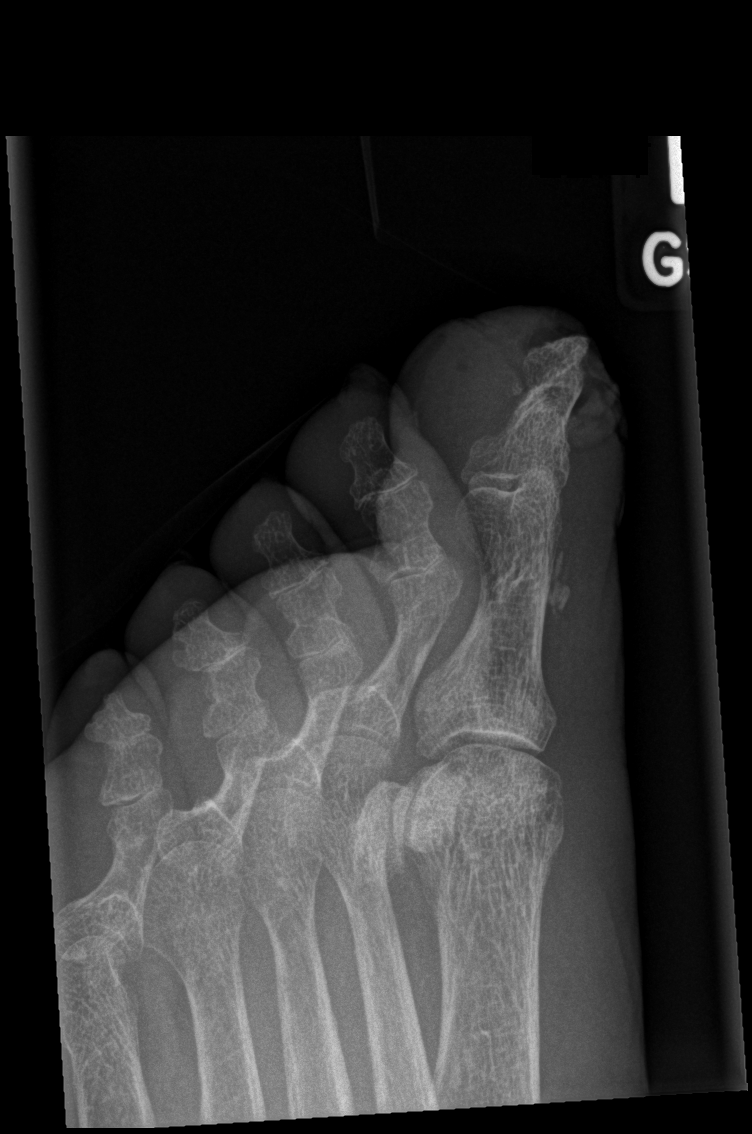

[3 of 3 positions shown; findings below may reference images not displayed]

FINDINGS: Large ulcer involving the tip of the great toe. The tuft of the
great toe appears exposed to air. No fragmentation or erosion. No
joint narrowing, fracture, or dislocation. There are soft tissue and
vascular calcifications of the great toe.
IMPRESSION: Large soft tissue ulcer of the great toe, with possible distal
phalanx exposure. No changes of osteomyelitis.

## 2015-04-06 ENCOUNTER — Encounter: Payer: Self-pay | Admitting: *Deleted
